# Patient Record
Sex: Male | Born: 1945 | Race: White | Hispanic: No | Smoking: Former smoker
Health system: Southern US, Community
[De-identification: ages and names within clinical notes are randomized; demographics above are authoritative.]

## PROBLEM LIST (undated history)

## (undated) DIAGNOSIS — I2584 Coronary atherosclerosis due to calcified coronary lesion: Secondary | ICD-10-CM

## (undated) DIAGNOSIS — I10 Essential (primary) hypertension: Secondary | ICD-10-CM

## (undated) DIAGNOSIS — Z720 Tobacco use: Secondary | ICD-10-CM

## (undated) DIAGNOSIS — I2510 Atherosclerotic heart disease of native coronary artery without angina pectoris: Secondary | ICD-10-CM

## (undated) DIAGNOSIS — I4891 Unspecified atrial fibrillation: Secondary | ICD-10-CM

## (undated) DIAGNOSIS — E785 Hyperlipidemia, unspecified: Secondary | ICD-10-CM

## (undated) DIAGNOSIS — E669 Obesity, unspecified: Secondary | ICD-10-CM

## (undated) DIAGNOSIS — G4733 Obstructive sleep apnea (adult) (pediatric): Secondary | ICD-10-CM

## (undated) DIAGNOSIS — G4730 Sleep apnea, unspecified: Secondary | ICD-10-CM

## (undated) DIAGNOSIS — N529 Male erectile dysfunction, unspecified: Secondary | ICD-10-CM

## (undated) HISTORY — DX: Sleep apnea, unspecified: G47.30

## (undated) HISTORY — DX: Obstructive sleep apnea (adult) (pediatric): G47.33

## (undated) HISTORY — DX: Hyperlipidemia, unspecified: E78.5

## (undated) HISTORY — PX: TONSILLECTOMY: SUR1361

## (undated) HISTORY — PX: EYE SURGERY: SHX253

## (undated) HISTORY — DX: Coronary atherosclerosis due to calcified coronary lesion: I25.84

## (undated) HISTORY — DX: Tobacco use: Z72.0

## (undated) HISTORY — DX: Atherosclerotic heart disease of native coronary artery without angina pectoris: I25.10

## (undated) HISTORY — DX: Obesity, unspecified: E66.9

## (undated) HISTORY — DX: Male erectile dysfunction, unspecified: N52.9

## (undated) HISTORY — DX: Unspecified atrial fibrillation: I48.91

## (undated) HISTORY — DX: Essential (primary) hypertension: I10

## (undated) HISTORY — PX: VASECTOMY: SHX75

---

## 2005-01-06 ENCOUNTER — Ambulatory Visit (HOSPITAL_COMMUNITY): Admission: RE | Admit: 2005-01-06 | Discharge: 2005-01-06 | Payer: Self-pay | Admitting: Gastroenterology

## 2012-01-16 ENCOUNTER — Other Ambulatory Visit: Payer: Self-pay | Admitting: Family Medicine

## 2012-02-11 ENCOUNTER — Other Ambulatory Visit: Payer: Self-pay | Admitting: *Deleted

## 2012-02-27 ENCOUNTER — Other Ambulatory Visit: Payer: Self-pay | Admitting: Physician Assistant

## 2012-03-30 ENCOUNTER — Other Ambulatory Visit: Payer: Self-pay | Admitting: Physician Assistant

## 2012-03-30 ENCOUNTER — Telehealth: Payer: Self-pay

## 2012-03-30 NOTE — Telephone Encounter (Signed)
Pt states he is needing rx refills and that he was told he needed an ov for more rx refills, he states he cant come in for ov he is unemployed, no insurance and is broke, he would like to know if Dr can refill rx for him.

## 2012-03-31 NOTE — Telephone Encounter (Signed)
Medication is already refilled.

## 2012-04-13 ENCOUNTER — Other Ambulatory Visit: Payer: Self-pay | Admitting: Physician Assistant

## 2012-04-15 ENCOUNTER — Other Ambulatory Visit: Payer: Self-pay | Admitting: Family Medicine

## 2012-04-15 MED ORDER — NIACIN ER (ANTIHYPERLIPIDEMIC) 1000 MG PO TBCR: EXTENDED_RELEASE_TABLET | ORAL | Status: DC

## 2012-05-18 ENCOUNTER — Ambulatory Visit: Payer: Self-pay | Admitting: Family Medicine

## 2012-05-18 ENCOUNTER — Encounter: Payer: Self-pay | Admitting: Family Medicine

## 2012-05-18 VITALS — BP 146/88 | HR 62 | Temp 97.6°F | Resp 16 | Ht 72.5 in | Wt 269.2 lb

## 2012-05-18 DIAGNOSIS — E785 Hyperlipidemia, unspecified: Secondary | ICD-10-CM

## 2012-05-18 DIAGNOSIS — I10 Essential (primary) hypertension: Secondary | ICD-10-CM

## 2012-05-18 DIAGNOSIS — Z125 Encounter for screening for malignant neoplasm of prostate: Secondary | ICD-10-CM

## 2012-05-18 MED ORDER — NIACIN ER (ANTIHYPERLIPIDEMIC) 1000 MG PO TBCR: EXTENDED_RELEASE_TABLET | ORAL | Status: DC

## 2012-05-18 MED ORDER — GEMFIBROZIL 600 MG PO TABS
600.0000 mg | ORAL_TABLET | Freq: Two times a day (BID) | ORAL | Status: DC
Start: 2012-05-18 — End: 2012-10-12

## 2012-05-18 MED ORDER — LOSARTAN POTASSIUM 50 MG PO TABS: 50.0000 mg | ORAL_TABLET | Freq: Every day | ORAL | Status: DC

## 2012-05-18 NOTE — Patient Instructions (Addendum)
Hypertriglyceridemia  Diet for High blood levels of Triglycerides Most fats in food are triglycerides. Triglycerides in your blood are stored as fat in your body. High levels of triglycerides in your blood may put you at a greater risk for heart disease and stroke.  Normal triglyceride levels are less than 150 mg/dL. Borderline high levels are 150-199 mg/dl. High levels are 200 - 499 mg/dL, and very high triglyceride levels are greater than 500 mg/dL. The decision to treat high triglycerides is generally based on the level. For people with borderline or high triglyceride levels, treatment includes weight loss and exercise. Drugs are recommended for people with very high triglyceride levels. Many people who need treatment for high triglyceride levels have metabolic syndrome. This syndrome is a collection of disorders that often include: insulin resistance, high blood pressure, blood clotting problems, high cholesterol and triglycerides. TESTING PROCEDURE FOR TRIGLYCERIDES  You should not eat 4 hours before getting your triglycerides measured. The normal range of triglycerides is between 10 and 250 milligrams per deciliter (mg/dl). Some people may have extreme levels (1000 or above), but your triglyceride level may be too high if it is above 150 mg/dl, depending on what other risk factors you have for heart disease.   People with high blood triglycerides may also have high blood cholesterol levels. If you have high blood cholesterol as well as high blood triglycerides, your risk for heart disease is probably greater than if you only had high triglycerides. High blood cholesterol is one of the main risk factors for heart disease.  CHANGING YOUR DIET  Your weight can affect your blood triglyceride level. If you are more than 20% above your ideal body weight, you may be able to lower your blood triglycerides by losing weight. Eating less and exercising regularly is the best way to combat this. Fat provides  more calories than any other food. The best way to lose weight is to eat less fat. Only 30% of your total calories should come from fat. Less than 7% of your diet should come from saturated fat. A diet low in fat and saturated fat is the same as a diet to decrease blood cholesterol. By eating a diet lower in fat, you may lose weight, lower your blood cholesterol, and lower your blood triglyceride level.  Eating a diet low in fat, especially saturated fat, may also help you lower your blood triglyceride level. Ask your dietitian to help you figure how much fat you can eat based on the number of calories your caregiver has prescribed for you.  Exercise, in addition to helping with weight loss may also help lower triglyceride levels.   Alcohol can increase blood triglycerides. You may need to stop drinking alcoholic beverages.   Too much carbohydrate in your diet may also increase your blood triglycerides. Some complex carbohydrates are necessary in your diet. These may include bread, rice, potatoes, other starchy vegetables and cereals.   Reduce "simple" carbohydrates. These may include pure sugars, candy, honey, and jelly without losing other nutrients. If you have the kind of high blood triglycerides that is affected by the amount of carbohydrates in your diet, you will need to eat less sugar and less high-sugar foods. Your caregiver can help you with this.   Adding 2-4 grams of fish oil (EPA+ DHA) may also help lower triglycerides. Speak with your caregiver before adding any supplements to your regimen.  Following the Diet  Maintain your ideal weight. Your caregivers can help you with a diet. Generally,   eating less food and getting more exercise will help you lose weight. Joining a weight control group may also help. Ask your caregivers for a good weight control group in your area.  Eat low-fat foods instead of high-fat foods. This can help you lose weight too.  These foods are lower in fat. Eat MORE  of these:   Dried beans, peas, and lentils.   Egg whites.   Low-fat cottage cheese.   Fish.   Lean cuts of meat, such as round, sirloin, rump, and flank (cut extra fat off meat you fix).   Whole grain breads, cereals and pasta.   Skim and nonfat dry milk.   Low-fat yogurt.   Poultry without the skin.   Cheese made with skim or part-skim milk, such as mozzarella, parmesan, farmers', ricotta, or pot cheese.  These are higher fat foods. Eat LESS of these:   Whole milk and foods made from whole milk, such as American, blue, cheddar, monterey jack, and swiss cheese   High-fat meats, such as luncheon meats, sausages, knockwurst, bratwurst, hot dogs, ribs, corned beef, ground pork, and regular ground beef.   Fried foods.  Limit saturated fats in your diet. Substituting unsaturated fat for saturated fat may decrease your blood triglyceride level. You will need to read package labels to know which products contain saturated fats.  These foods are high in saturated fat. Eat LESS of these:   Fried pork skins.   Whole milk.   Skin and fat from poultry.   Palm oil.   Butter.   Shortening.   Cream cheese.   Bacon.   Margarines and baked goods made from listed oils.   Vegetable shortenings.   Chitterlings.   Fat from meats.   Coconut oil.   Palm kernel oil.   Lard.   Cream.   Sour cream.   Fatback.   Coffee whiteners and non-dairy creamers made with these oils.   Cheese made from whole milk.  Use unsaturated fats (both polyunsaturated and monounsaturated) moderately. Remember, even though unsaturated fats are better than saturated fats; you still want a diet low in total fat.  These foods are high in unsaturated fat:   Canola oil.   Sunflower oil.   Mayonnaise.   Almonds.   Peanuts.   Pine nuts.   Margarines made with these oils.   Safflower oil.   Olive oil.   Avocados.   Cashews.   Peanut butter.   Sunflower seeds.   Soybean oil.     Peanut oil.   Olives.   Pecans.   Walnuts.   Pumpkin seeds.  Avoid sugar and other high-sugar foods. This will decrease carbohydrates without decreasing other nutrients. Sugar in your food goes rapidly to your blood. When there is excess sugar in your blood, your liver may use it to make more triglycerides. Sugar also contains calories without other important nutrients.  Eat LESS of these:   Sugar, brown sugar, powdered sugar, jam, jelly, preserves, honey, syrup, molasses, pies, candy, cakes, cookies, frosting, pastries, colas, soft drinks, punches, fruit drinks, and regular gelatin.   Avoid alcohol. Alcohol, even more than sugar, may increase blood triglycerides. In addition, alcohol is high in calories and low in nutrients. Ask for sparkling water, or a diet soft drink instead of an alcoholic beverage.  Suggestions for planning and preparing meals   Bake, broil, grill or roast meats instead of frying.   Remove fat from meats and skin from poultry before cooking.   Add spices,   herbs, lemon juice or vinegar to vegetables instead of salt, rich sauces or gravies.   Use a non-stick skillet without fat or use no-stick sprays.   Cool and refrigerate stews and broth. Then remove the hardened fat floating on the surface before serving.   Refrigerate meat drippings and skim off fat to make low-fat gravies.   Serve more fish.   Use less butter, margarine and other high-fat spreads on bread or vegetables.   Use skim or reconstituted non-fat dry milk for cooking.   Cook with low-fat cheeses.   Substitute low-fat yogurt or cottage cheese for all or part of the sour cream in recipes for sauces, dips or congealed salads.   Use half yogurt/half mayonnaise in salad recipes.   Substitute evaporated skim milk for cream. Evaporated skim milk or reconstituted non-fat dry milk can be whipped and substituted for whipped cream in certain recipes.   Choose fresh fruits for dessert instead of  high-fat foods such as pies or cakes. Fruits are naturally low in fat.  When Dining Out   Order low-fat appetizers such as fruit or vegetable juice, pasta with vegetables or tomato sauce.   Select clear, rather than cream soups.   Ask that dressings and gravies be served on the side. Then use less of them.   Order foods that are baked, broiled, poached, steamed, stir-fried, or roasted.   Ask for margarine instead of butter, and use only a small amount.   Drink sparkling water, unsweetened tea or coffee, or diet soft drinks instead of alcohol or other sweet beverages.  QUESTIONS AND ANSWERS ABOUT OTHER FATS IN THE BLOOD: SATURATED FAT, TRANS FAT, AND CHOLESTEROL What is trans fat? Trans fat is a type of fat that is formed when vegetable oil is hardened through a process called hydrogenation. This process helps makes foods more solid, gives them shape, and prolongs their shelf life. Trans fats are also called hydrogenated or partially hydrogenated oils.  What do saturated fat, trans fat, and cholesterol in foods have to do with heart disease? Saturated fat, trans fat, and cholesterol in the diet all raise the level of LDL "bad" cholesterol in the blood. The higher the LDL cholesterol, the greater the risk for coronary heart disease (CHD). Saturated fat and trans fat raise LDL similarly.  What foods contain saturated fat, trans fat, and cholesterol? High amounts of saturated fat are found in animal products, such as fatty cuts of meat, chicken skin, and full-fat dairy products like butter, whole milk, cream, and cheese, and in tropical vegetable oils such as palm, palm kernel, and coconut oil. Trans fat is found in some of the same foods as saturated fat, such as vegetable shortening, some margarines (especially hard or stick margarine), crackers, cookies, baked goods, fried foods, salad dressings, and other processed foods made with partially hydrogenated vegetable oils. Small amounts of trans fat  also occur naturally in some animal products, such as milk products, beef, and lamb. Foods high in cholesterol include liver, other organ meats, egg yolks, shrimp, and full-fat dairy products. How can I use the new food label to make heart-healthy food choices? Check the Nutrition Facts panel of the food label. Choose foods lower in saturated fat, trans fat, and cholesterol. For saturated fat and cholesterol, you can also use the Percent Daily Value (%DV): 5% DV or less is low, and 20% DV or more is high. (There is no %DV for trans fat.) Use the Nutrition Facts panel to choose foods low in   saturated fat and cholesterol, and if the trans fat is not listed, read the ingredients and limit products that list shortening or hydrogenated or partially hydrogenated vegetable oil, which tend to be high in trans fat. POINTS TO REMEMBER: YOU NEED A LITTLE TLC (THERAPEUTIC LIFESTYLE CHANGES)  Discuss your risk for heart disease with your caregivers, and take steps to reduce risk factors.   Change your diet. Choose foods that are low in saturated fat, trans fat, and cholesterol.   Add exercise to your daily routine if it is not already being done. Participate in physical activity of moderate intensity, like brisk walking, for at least 30 minutes on most, and preferably all days of the week. No time? Break the 30 minutes into three, 10-minute segments during the day.   Stop smoking. If you do smoke, contact your caregiver to discuss ways in which they can help you quit.   Do not use street drugs.   Maintain a normal weight.   Maintain a healthy blood pressure.   Keep up with your blood work for checking the fats in your blood as directed by your caregiver.  Document Released: 08/21/2004 Document Revised: 10/23/2011 Document Reviewed: 03/19/2009 [employer] Patient Information 2012 [employer]. 

## 2012-05-19 ENCOUNTER — Other Ambulatory Visit: Payer: Self-pay

## 2012-05-19 ENCOUNTER — Encounter: Payer: Self-pay | Admitting: Family Medicine

## 2012-05-19 DIAGNOSIS — E669 Obesity, unspecified: Secondary | ICD-10-CM | POA: Insufficient documentation

## 2012-05-19 NOTE — Progress Notes (Signed)
  Subjective:    [name], male    DOB: 22-Oct-1946, 66 y.o.   MRN: [id number]  HPI This 66 y.o. 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 He has no problems with current medications and denies HA, CP, palpitations, vision  changes, SOB, cough, fatigue, dizziness or lightheadedness, numbness or weakness. He needs med refills.  He is not fasting so labs cannot be done today (last labs performed > 1 year ago).   Review of Systems As per HPI.     Objective:   Physical Exam  Nursing note and vitals reviewed. Constitutional: He is oriented to person, place, and time. He appears well-developed and well-nourished. No distress.  HENT:  Head: Normocephalic and atraumatic.  Eyes: Conjunctivae and EOM are normal. No scleral icterus.  Cardiovascular: Normal rate.   Pulmonary/Chest: Effort normal. No respiratory distress.  Neurological: He is alert and oriented to person, place, and time. No cranial nerve deficit.  Skin: Skin is warm and dry.  Psychiatric: His behavior is normal. Thought content normal.          Assessment & Plan:   1. HTN (hypertension) -a little elevated today; encouraged healthier nutrition and weight reduction losartan (COZAAR) 50 MG tablet  Comprehensive metabolic panel  2. Dyslipidemia - improve nutrition and continue current medications niacin (NIASPAN) 1000 MG CR tablet gemfibrozil (LOPID) 600 MG tablet Lipid panel  3. Special screening for malignant neoplasm of prostate  PSA- will check today in anticipation of CPE to be done at later date

## 2012-05-25 ENCOUNTER — Other Ambulatory Visit (INDEPENDENT_AMBULATORY_CARE_PROVIDER_SITE_OTHER): Payer: Self-pay | Admitting: *Deleted

## 2012-05-25 ENCOUNTER — Other Ambulatory Visit: Payer: Self-pay | Admitting: Family Medicine

## 2012-05-25 DIAGNOSIS — I10 Essential (primary) hypertension: Secondary | ICD-10-CM

## 2012-05-25 DIAGNOSIS — Z125 Encounter for screening for malignant neoplasm of prostate: Secondary | ICD-10-CM

## 2012-05-25 DIAGNOSIS — E785 Hyperlipidemia, unspecified: Secondary | ICD-10-CM

## 2012-05-25 LAB — COMPREHENSIVE METABOLIC PANEL
CO2: 26 mEq/L (ref 19–32)
Creat: 0.71 mg/dL (ref 0.50–1.35)
Glucose, Bld: 101 mg/dL — ABNORMAL HIGH (ref 70–99)
Total Bilirubin: 0.9 mg/dL (ref 0.3–1.2)

## 2012-05-25 LAB — LIPID PANEL
Cholesterol: 198 mg/dL (ref 0–200)
Total CHOL/HDL Ratio: 6.2 Ratio
Triglycerides: 117 mg/dL (ref <150)
VLDL: 23 mg/dL (ref 0–40)

## 2012-05-28 NOTE — Progress Notes (Signed)
Quick Note:  Please call pt and advise that the following labs are abnormal...  LDL("bad") cholesterol is high; change nutrition to eat more fruit and vegetables and avoid junk and "processed" foods. Avoid fried foods also. Eat more fish and chicken (without the skin).  Regular exercise will help lower this number. Continue current medications.  Triglycerides are well controlled on medications.  Other labs are normal.   Copy to pt. ______

## 2012-08-02 ENCOUNTER — Telehealth: Payer: Self-pay

## 2012-08-02 DIAGNOSIS — I10 Essential (primary) hypertension: Secondary | ICD-10-CM

## 2012-08-02 NOTE — Telephone Encounter (Addendum)
PT STATES HIS PHARMACY HAVE BEEN TRYING TO GET 2 OF HIS MEDS REFILLED AND THEY HAVEN'T HEARD FROM ANYONE PLEASE CALL PT AT [phone]    [facility]

## 2012-08-03 MED ORDER — SILDENAFIL CITRATE 100 MG PO TABS: 100.0000 mg | ORAL_TABLET | Freq: Every day | ORAL | Status: DC | PRN

## 2012-08-03 MED ORDER — LOSARTAN POTASSIUM 50 MG PO TABS: 50.0000 mg | ORAL_TABLET | Freq: Every day | ORAL | Status: DC

## 2012-08-03 NOTE — Telephone Encounter (Signed)
I have refilled both medications, and printed them. He does need a CPE for further refills.

## 2012-08-03 NOTE — Telephone Encounter (Signed)
Wants his Viagra and Losartan 50 mg, he uses [facility]. I have advised him it may be best to give written Rx to him and have him send to [facility]. He said he will have [facility] contact us. ? Okay to fill

## 2012-08-03 NOTE — Telephone Encounter (Signed)
Thanks patient advised, I will leave the Rx at desk, since patient is going to have [facility] call me about these.

## 2012-08-04 ENCOUNTER — Other Ambulatory Visit: Payer: Self-pay | Admitting: *Deleted

## 2012-08-09 ENCOUNTER — Telehealth: Payer: Self-pay

## 2012-08-09 DIAGNOSIS — I10 Essential (primary) hypertension: Secondary | ICD-10-CM

## 2012-08-09 MED ORDER — LOSARTAN POTASSIUM 50 MG PO TABS: 50.0000 mg | ORAL_TABLET | Freq: Every day | ORAL | Status: DC

## 2012-08-09 NOTE — Telephone Encounter (Signed)
Please call patient to see how he is taking it.

## 2012-08-09 NOTE — Telephone Encounter (Signed)
[facility] called to clarify Rx sig for pt's cozaar. It says take 1 daily and also says take 1/2 daily. Checked w/local [facility] pharm where pt last had RF on 05/20/12 and the sig for the 50 mg was take one tab daily. Do you want me to call [facility] back and give them this sig?

## 2012-08-09 NOTE — Telephone Encounter (Signed)
LMOM to CB. 

## 2012-08-09 NOTE — Telephone Encounter (Signed)
Pt stated that he used to take 100 mg and cut them in half, but most recently has gotten the 50 mg and takes one per day which he prefers so he doesn't have to cut it. Called [facility] back and verified sig and entered in [place] for records.

## 2012-10-12 ENCOUNTER — Other Ambulatory Visit: Payer: Self-pay | Admitting: Radiology

## 2012-10-12 DIAGNOSIS — E785 Hyperlipidemia, unspecified: Secondary | ICD-10-CM

## 2012-10-12 MED ORDER — GEMFIBROZIL 600 MG PO TABS: 600.0000 mg | ORAL_TABLET | Freq: Two times a day (BID) | ORAL | Status: DC

## 2012-11-17 HISTORY — PX: COLONOSCOPY: SHX174

## 2012-12-19 ENCOUNTER — Telehealth: Payer: Self-pay | Admitting: *Deleted

## 2012-12-19 DIAGNOSIS — E785 Hyperlipidemia, unspecified: Secondary | ICD-10-CM

## 2012-12-19 NOTE — Telephone Encounter (Signed)
[email or web address] requesting refill on lopid 600mg .  Fax number is [phone]

## 2012-12-20 MED ORDER — GEMFIBROZIL 600 MG PO TABS: 600.0000 mg | ORAL_TABLET | Freq: Two times a day (BID) | ORAL | Status: DC

## 2012-12-20 NOTE — Telephone Encounter (Signed)
faxed

## 2012-12-20 NOTE — Telephone Encounter (Signed)
Rx printed to be sent to [facility]

## 2013-01-19 ENCOUNTER — Other Ambulatory Visit: Payer: Self-pay | Admitting: Radiology

## 2013-01-19 DIAGNOSIS — I10 Essential (primary) hypertension: Secondary | ICD-10-CM

## 2013-01-19 DIAGNOSIS — E785 Hyperlipidemia, unspecified: Secondary | ICD-10-CM

## 2013-01-19 NOTE — Telephone Encounter (Signed)
Patient needs office visit before we can renew his cozaar.

## 2013-01-19 NOTE — Telephone Encounter (Signed)
[facility] requesting Cozaar and Lipid please advise.

## 2013-01-20 ENCOUNTER — Telehealth: Payer: Self-pay | Admitting: Radiology

## 2013-01-20 NOTE — Telephone Encounter (Signed)
Pt last seen by me in July 2013; I will authorize medications for 3 months. (Pt needs to schedule visit before any more refills). EMR record shows local pharmacy. Print out those medications (#90 - no refills) and FAX to [facility] if you have the number. Thanks.

## 2013-01-20 NOTE — Telephone Encounter (Signed)
Spoke to patient he already has the rx 's and will make appt with Dr Audria Nine.

## 2013-01-20 NOTE — Telephone Encounter (Signed)
There is not a fax for them, the Rx's will need to be printed. They have stock bottles of 100 tablets. Have pended #100 please print and sign so he can get these. Patient advised he is due for follow up.

## 2013-01-20 NOTE — Telephone Encounter (Signed)
Per Amy pt does not need rx's. Disregard

## 2013-05-06 ENCOUNTER — Other Ambulatory Visit: Payer: Self-pay | Admitting: Radiology

## 2013-05-09 ENCOUNTER — Telehealth: Payer: Self-pay

## 2013-05-09 DIAGNOSIS — E785 Hyperlipidemia, unspecified: Secondary | ICD-10-CM

## 2013-05-09 DIAGNOSIS — I10 Essential (primary) hypertension: Secondary | ICD-10-CM

## 2013-05-09 NOTE — Telephone Encounter (Signed)
Pt told by his pharmacy ([facility]) that he could not have a refill on rx's because he needs to see a dr first. Pt states he has an appt set up with dr Audria Nine august 6 and will run out of his medications before then.   Please call pt: [phone]  bf

## 2013-05-10 MED ORDER — GEMFIBROZIL 600 MG PO TABS: 600.0000 mg | ORAL_TABLET | Freq: Two times a day (BID) | ORAL | Status: DC

## 2013-05-10 MED ORDER — NIACIN ER (ANTIHYPERLIPIDEMIC) 1000 MG PO TBCR: EXTENDED_RELEASE_TABLET | ORAL | Status: DC

## 2013-05-10 MED ORDER — LOSARTAN POTASSIUM 50 MG PO TABS: 50.0000 mg | ORAL_TABLET | Freq: Every day | ORAL | Status: DC

## 2013-05-10 NOTE — Telephone Encounter (Signed)
faxed

## 2013-05-10 NOTE — Telephone Encounter (Signed)
Pended please advise. These will print so we can fax to [facility]

## 2013-05-10 NOTE — Telephone Encounter (Signed)
Printed rx  

## 2013-05-13 ENCOUNTER — Ambulatory Visit: Payer: Self-pay | Admitting: Family Medicine

## 2013-05-13 VITALS — BP 124/72 | HR 61 | Temp 98.0°F | Resp 18 | Ht 73.5 in | Wt 263.0 lb

## 2013-05-13 DIAGNOSIS — L723 Sebaceous cyst: Secondary | ICD-10-CM

## 2013-05-13 MED ORDER — DOXYCYCLINE HYCLATE 100 MG PO TABS: 100.0000 mg | ORAL_TABLET | Freq: Two times a day (BID) | ORAL | Status: DC

## 2013-05-13 NOTE — Progress Notes (Signed)
[name] MRN: [id number], DOB: 03-11-1946, 67 y.o. Date of Encounter: 05/13/2013, 4:54 PM    PROCEDURE NOTE: Verbal consent obtained. Betadine prep per usual protocol. Local anesthesia obtained with 2% lidocaine with epinephrine.   1 cm incision made with 11 blade along lesion.  Culture taken. Moderate amount of purulence expressed. Copious sebaceous material expressed.  Lesion explored revealing no loculations. Packed with 1/4 plain packing, about 2 inches.   Dressed. Wound care instructions including precautions with patient. Patient tolerated the procedure well. Recheck in 48 hours.       Grier Mitts, PA-C 05/13/2013 4:54 PM

## 2013-05-13 NOTE — Progress Notes (Signed)
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 The swelling is been getting worse over the last week.  Objective: 2 cm raised erythematous subcutaneous mass which is tender, over the right scapula.  Please see PA procedure note  Assessment: Infected sebaceous cyst

## 2013-05-15 ENCOUNTER — Ambulatory Visit (INDEPENDENT_AMBULATORY_CARE_PROVIDER_SITE_OTHER): Payer: Self-pay | Admitting: Physician Assistant

## 2013-05-15 VITALS — BP 119/81 | HR 59 | Temp 98.0°F | Resp 16 | Ht 73.5 in | Wt 261.0 lb

## 2013-05-15 DIAGNOSIS — L089 Local infection of the skin and subcutaneous tissue, unspecified: Secondary | ICD-10-CM

## 2013-05-15 DIAGNOSIS — L723 Sebaceous cyst: Secondary | ICD-10-CM

## 2013-05-15 NOTE — Progress Notes (Signed)
   [address], [place] [address]   Phone [phone]  Subjective:    [name], male    DOB: 01-Aug-1946, 67 y.o.   MRN: [id number]  HPI Pt presents to clinic for wound recheck.  He is s/p I&D on 6/27 of an infected sebaceous cyst.  No wound culture was done.  He is tolerating the abx ok.   e has not changed the drsg per his instructions.  The area feels much better.   Review of Systems  Constitutional: Negative for fever and chills.  Gastrointestinal: Negative for nausea.  Skin: Positive for wound.       Objective:   Physical Exam  Vitals reviewed. Constitutional: He is oriented to person, place, and time. He appears well-developed and well-nourished.  HENT:  Head: Normocephalic and atraumatic.  Right Ear: External ear normal.  Left Ear: External ear normal.  Pulmonary/Chest: Effort normal.  Neurological: He is alert and oriented to person, place, and time.  Skin: Skin is warm and dry.  Drsg and packing removed.  Minimal erythema around incision.  Minimal purulence expressed.  Irrigated with 2% lido and repacked with 1.5cm packing.  Drsg placed.  Psychiatric: He has a normal mood and affect. His behavior is normal. Judgment and thought content normal.       Assessment & Plan:  Infected Sebaceous Cyst - improved - continue abx recheck on Thursday.  Pt is going out of town and on Tuesday evening or Wednesday am he will pull out 1 cm of packing to reduce the pressure in the wound cavity.  Change the drsg daily.  Benny Lennert PA-C 05/15/2013 8:35 AM

## 2013-05-19 ENCOUNTER — Ambulatory Visit (INDEPENDENT_AMBULATORY_CARE_PROVIDER_SITE_OTHER): Payer: Self-pay | Admitting: Physician Assistant

## 2013-05-19 VITALS — BP 157/82 | HR 61 | Temp 97.5°F | Resp 18 | Ht 73.0 in | Wt 261.8 lb

## 2013-05-19 DIAGNOSIS — L723 Sebaceous cyst: Secondary | ICD-10-CM

## 2013-05-19 NOTE — Progress Notes (Signed)
  Subjective:    [name], male    DOB: January 20, 1946, 67 y.o.   MRN: [id number]  HPI   Edward Watson is a very pleasant 67 yr old male here for wound care.  S/p I&D on 05/13/13.  Pt states he is doing very well.  No pain.  The area continues to drain.  He changes the dressing once daily.  He did pull the packing out slightly as directed at last visit.  Tolerating the abx well.  No fever, chills, nausea, vomiting.     Review of Systems  Constitutional: Negative for fever and chills.  HENT: Negative.   Respiratory: Negative.   Cardiovascular: Negative.   Gastrointestinal: Negative.   Musculoskeletal: Negative.   Skin: Positive for wound.  Neurological: Negative.        Objective:   Physical Exam  Vitals reviewed. Constitutional: He is oriented to person, place, and time. He appears well-developed and well-nourished. No distress.  HENT:  Head: Normocephalic and atraumatic.  Eyes: Conjunctivae are normal. No scleral icterus.  Pulmonary/Chest: Effort normal.  Neurological: He is alert and oriented to person, place, and time.  Skin: Skin is warm and dry.     Healing wound at right upper back; no erythema or induration  Psychiatric: He has a normal mood and affect. His behavior is normal.    Wound Care: Dressing and packing removed.  Packing saturated with purulent material.  Able to express a very small amount of purulence from the wound.  Irrigated with 3cc 2% plain lidocaine.  Repacked with 1/4" plain packing.  Dressing applied.         Assessment & Plan:  Infected sebaceous cyst   Edward Watson is a very pleasant 67 yr old male here for follow up on an infected sebaceous cyst that was drained here 05/13/13.  Pt is doing very well.  The wound is healing very nicely.  Small amount of drainage still.  I have repacked the wound.  Continue daily dressing changes.  Continue abx.  Recheck in 2-3 days.  Fast track card updated for 7/5 or 7/6, pt preference.  RTC sooner if  concerns.

## 2013-05-22 ENCOUNTER — Ambulatory Visit (INDEPENDENT_AMBULATORY_CARE_PROVIDER_SITE_OTHER): Payer: Self-pay | Admitting: Physician Assistant

## 2013-05-22 VITALS — BP 130/76 | HR 65 | Temp 97.7°F | Resp 18 | Ht 73.0 in | Wt 263.0 lb

## 2013-05-22 DIAGNOSIS — L723 Sebaceous cyst: Secondary | ICD-10-CM

## 2013-05-22 DIAGNOSIS — L089 Local infection of the skin and subcutaneous tissue, unspecified: Secondary | ICD-10-CM

## 2013-05-22 NOTE — Progress Notes (Signed)
   [name] MRN: [id number], DOB: 11/26/1945 67 y.o. Date of Encounter: 05/22/2013, 3:07 PM  Primary Physician: Dow Adolph, MD  Chief Complaint: Wound care   See previous note  HPI: 67 y.o. male presents for wound care s/p I&D on 05/13/13 Doing well No issues or complaints Afebrile/ no chills No nausea or vomiting Tolerating Doxycycline Pain resolved Daily dressing change Previous note reviewed  Past Medical History  Diagnosis Date  . Hypertension   . Hyperlipidemia      Home Meds: Prior to Admission medications   Medication Sig Start Date End Date Taking? Authorizing Provider  doxycycline (VIBRA-TABS) 100 MG tablet Take 1 tablet (100 mg total) by mouth 2 (two) times daily. 05/13/13  Yes Elvina Sidle, MD  gemfibrozil (LOPID) 600 MG tablet Take 1 tablet (600 mg total) by mouth 2 (two) times daily. 05/10/13  Yes Eleanore E Egan, PA-C  losartan (COZAAR) 50 MG tablet Take 1 tablet (50 mg total) by mouth daily. 05/10/13  Yes Eleanore Delia Chimes, PA-C  niacin (NIASPAN) 1000 MG CR tablet Take 1 pill once daily as directed with a low fat snack and aspirin 30 minutes prior to taking medication 05/10/13  Yes Eleanore E Egan, PA-C  sildenafil (VIAGRA) 100 MG tablet Take 1 tablet (100 mg total) by mouth daily as needed. 08/03/12  Yes Dustine Bertini M Madhavi Hamblen, PA-C    Allergies: No Known Allergies  ROS: Constitutional: Afebrile, no chills Cardiovascular: negative for chest pain or palpitations Dermatological: Positive for wound. Negative for erythema, pain, or warmth  GI: No nausea or vomiting   EXAM: Physical Exam: Blood pressure 130/76, pulse 65, temperature 97.7 F (36.5 C), temperature source Oral, resp. rate 18, height 6\' 1"  (1.854 m), weight 263 lb (119.296 kg), SpO2 95.00%., Body mass index is 34.71 kg/(m^2). General: Well developed, well nourished, in no acute distress. Nontoxic appearing. Head: Normocephalic, atraumatic, sclera non-icteric.  Neck: Supple. Lungs:  Breathing is unlabored. Heart: Normal rate. Skin:  Warm and moist. Dressing and packing in place. No induration, erythema, or tenderness to palpation. Neuro: Alert and oriented X 3. Moves all extremities spontaneously. Normal gait.  Psych:  Responds to questions appropriately with a normal affect.   PROCEDURE: Dressing and packing removed. Still able to express a very small amount of purulence from the wound Wound bed healthy Irrigated with 1% plain lidocaine 5 cc. Repacked with small amount of 1/4 inch plain packing Dressing applied  LAB: Culture:   A/P: 67 y.o. male with cellulitis/abscess as above s/p I&D on 05/13/13 -Wound care per above -Continue Doxycyline -Pain well controlled -Daily dressing changes -Recheck 48 hours -Possibly last visit  SignedEula Listen, PA-C 05/22/2013 3:07 PM

## 2013-05-24 ENCOUNTER — Ambulatory Visit (INDEPENDENT_AMBULATORY_CARE_PROVIDER_SITE_OTHER): Payer: Self-pay | Admitting: Physician Assistant

## 2013-05-24 VITALS — BP 124/72 | HR 66 | Temp 97.8°F | Resp 18 | Ht 73.0 in | Wt 264.0 lb

## 2013-05-24 DIAGNOSIS — L723 Sebaceous cyst: Secondary | ICD-10-CM

## 2013-05-24 NOTE — Progress Notes (Signed)
  Subjective:    [name], male    DOB: 07-Aug-1946, 67 y.o.   MRN: [id number]  HPI   Edward Watson is a very pleasant 67 yr old male here for follow up on an infected sebaceous cyst.  Previous notes reviewed.  Pt states he continues to do very well.  Has no pain whatsoever.  Continues to tolerate the abx very well - three doses left.  Has taken care to cover up when outside on his motorcycle due to sun sensitivity with doxy.  Has been changing the dressing as directed.    Review of Systems  Skin: Positive for wound.  All other systems reviewed and are negative.       Objective:   Physical Exam  Vitals reviewed. Constitutional: He is oriented to person, place, and time. He appears well-developed and well-nourished. No distress.  HENT:  Head: Normocephalic and atraumatic.  Eyes: Conjunctivae are normal. No scleral icterus.  Pulmonary/Chest: Effort normal.  Neurological: He is alert and oriented to person, place, and time.  Skin: Skin is warm and dry.     Healing wound at right upper back; no erythema or induration; wound bed healthy; able to express a very small amount of purulence   Psychiatric: He has a normal mood and affect. His behavior is normal.        Assessment & Plan:  Infected sebaceous cyst   Edward Watson is a very pleasant 67 yr old male here for follow up on an infected sebaceous cyst.  The wound appears to be healing very well.  I was able to express a small amount of purulence, which I think was unable to drain due to packing.  I do not think the area requires any further packing material.  Continue daily and prn dressing changes until wound is completely closed.  Finish abx.  No need for further follow up unless new concerns arise.

## 2013-06-22 ENCOUNTER — Telehealth: Payer: Self-pay | Admitting: Family Medicine

## 2013-06-22 ENCOUNTER — Encounter: Payer: Self-pay | Admitting: Family Medicine

## 2013-06-22 ENCOUNTER — Ambulatory Visit (INDEPENDENT_AMBULATORY_CARE_PROVIDER_SITE_OTHER): Payer: Medicare Other | Admitting: Family Medicine

## 2013-06-22 VITALS — BP 142/77 | HR 64 | Temp 97.0°F | Resp 16 | Ht 73.5 in | Wt 262.0 lb

## 2013-06-22 DIAGNOSIS — E785 Hyperlipidemia, unspecified: Secondary | ICD-10-CM

## 2013-06-22 DIAGNOSIS — I10 Essential (primary) hypertension: Secondary | ICD-10-CM

## 2013-06-22 DIAGNOSIS — Z130 Encounter for screening for diseases of the blood and blood-forming organs and certain disorders involving the immune mechanism: Secondary | ICD-10-CM

## 2013-06-22 DIAGNOSIS — Z7189 Other specified counseling: Secondary | ICD-10-CM

## 2013-06-22 DIAGNOSIS — Z125 Encounter for screening for malignant neoplasm of prostate: Secondary | ICD-10-CM

## 2013-06-22 DIAGNOSIS — Z720 Tobacco use: Secondary | ICD-10-CM | POA: Insufficient documentation

## 2013-06-22 DIAGNOSIS — Z716 Tobacco abuse counseling: Secondary | ICD-10-CM

## 2013-06-22 HISTORY — DX: Tobacco use: Z72.0

## 2013-06-22 LAB — CBC WITH DIFFERENTIAL/PLATELET
Basophils Absolute: 0 10*3/uL (ref 0.0–0.1)
Basophils Relative: 0 % (ref 0–1)
Eosinophils Relative: 4 % (ref 0–5)
HCT: 42 % (ref 39.0–52.0)
Lymphocytes Relative: 34 % (ref 12–46)
MCHC: 34.5 g/dL (ref 30.0–36.0)
MCV: 88.6 fL (ref 78.0–100.0)
Monocytes Absolute: 0.6 10*3/uL (ref 0.1–1.0)
RDW: 13.3 % (ref 11.5–15.5)

## 2013-06-22 LAB — LIPID PANEL
HDL: 35 mg/dL — ABNORMAL LOW (ref >39)
LDL Cholesterol: 119 mg/dL — ABNORMAL HIGH (ref 0–99)
Triglycerides: 127 mg/dL (ref <150)
VLDL: 25 mg/dL (ref 0–40)

## 2013-06-22 LAB — COMPREHENSIVE METABOLIC PANEL
ALT: 18 U/L (ref 0–53)
AST: 20 U/L (ref 0–37)
Creat: 0.78 mg/dL (ref 0.50–1.35)
Total Bilirubin: 0.6 mg/dL (ref 0.3–1.2)

## 2013-06-22 MED ORDER — GEMFIBROZIL 600 MG PO TABS: 600.0000 mg | ORAL_TABLET | Freq: Two times a day (BID) | ORAL | Status: DC

## 2013-06-22 MED ORDER — VARENICLINE TARTRATE 0.5 MG PO TABS: ORAL_TABLET | ORAL | Status: DC

## 2013-06-22 MED ORDER — LOSARTAN POTASSIUM 50 MG PO TABS: 50.0000 mg | ORAL_TABLET | Freq: Every day | ORAL | Status: DC

## 2013-06-22 MED ORDER — NIACIN ER (ANTIHYPERLIPIDEMIC) 1000 MG PO TBCR: EXTENDED_RELEASE_TABLET | ORAL | Status: DC

## 2013-06-22 MED ORDER — VARENICLINE TARTRATE 1 MG PO TABS: 1.0000 mg | ORAL_TABLET | Freq: Two times a day (BID) | ORAL | Status: DC

## 2013-06-22 NOTE — Telephone Encounter (Signed)
Pt called, said pharmacy was going to charge over $700 for his prescriptions. He would like [place] to fax three of them to [facility] instead. (Losartan, Genfibrozil, and Niaspan)   [facility] Fax: [phone]. Pt would like a call back regarding this: [phone].

## 2013-06-22 NOTE — Patient Instructions (Addendum)

## 2013-06-22 NOTE — Progress Notes (Signed)
S:  This 67 y.o. Cauc male has HTN and dyslipidemia (Low HDL and high LDL with CAD risk >6.0 based on TChol/HDL ratio). Pt is compliant with medications and lifestyle changes. Pt denies fatigue, diaphoresis, appetite loss, vision changes, CP or tightness, palpitations, edema, SOB or DOE, cough or wheezing, GI problems, myalgias, HA, dizziness, numbness, weakness, tremor or syncope. He continues to travel on his motorcycle.  Pt requests Chantix; he has quit smoking before but resumes habit when stressed. He has no chronic SOB, dyspnea, prod cough or hemoptysis.   Patient Active Problem List   Diagnosis Date Noted  . Tobacco user 06/22/2013  . Obesity, Class II, BMI 35-39.9, with comorbidity 05/19/2012  . HTN (hypertension) 05/18/2012  . Dyslipidemia 05/18/2012   PMHx, Soc Hx and Fam Hx reviewed. Medications reconciled.  ROS: As per HPI; otherwise noncontributory.  O: Filed Vitals:   06/22/13 0914  BP: 142/77  Pulse: 64  Temp: 97 F (36.1 C)  Resp: 16   GEN: In NAD; WN,WD. HENT: [place]/AT; EOMI w/ clear conj/sclerae. EACs/nose/oroph unremarkable. COR: RRR. No m/g/r. LUNGS: CTA; normal resp rate and effort. SKIN: W&D; no rashes, erythema or pallor. MS: MAEs; no c/c/e. No joint deformities. NEURO: A&Ox 3; Cns intact. Nonfocal.  A/P: Dyslipidemia - Plan: Lipid panel, gemfibrozil (LOPID) 600 MG tablet, niacin (NIASPAN) 1000 MG CR tablet  HTN (hypertension) - Stable and controlled on current medications.        Plan: Comprehensive metabolic panel, TSH, losartan (COZAAR) 50 MG tablet, DISCONTINUED: losartan (COZAAR) 50 MG tablet  Encounter for smoking cessation counseling- Advised about Chantix use and picking quit date w/in 2 weeks of starting medication.  Screening for deficiency anemia - Plan: CBC with Differential  Special screening for malignant neoplasm of prostate - Plan: PSA  RTC for CPE in 6 weeks.  Meds ordered this encounter  Medications  . DISCONTD: losartan (COZAAR) 50  MG tablet    Sig: Take 1 tablet (50 mg total) by mouth daily.    Dispense:  100 tablet    Refill:  2  . gemfibrozil (LOPID) 600 MG tablet    Sig: Take 1 tablet (600 mg total) by mouth 2 (two) times daily.    Dispense:  180 tablet    Refill:  3    Order Specific Question:  Supervising Provider    Answer:  DOOLITTLE, ROBERT P [[id number]]  . niacin (NIASPAN) 1000 MG CR tablet    Sig: Take 1 pill once daily as directed with a low fat snack and aspirin 30 minutes prior to taking medication    Dispense:  100 tablet    Refill:  2  . losartan (COZAAR) 50 MG tablet    Sig: Take 1 tablet (50 mg total) by mouth daily.    Dispense:  30 tablet    Refill:  0  . varenicline (CHANTIX CONTINUING MONTH PAK) 1 MG tablet    Sig: Take 1 tablet (1 mg total) by mouth 2 (two) times daily.    Dispense:  60 tablet    Refill:  2  . varenicline (CHANTIX) 0.5 MG tablet    Sig: Take 1 tablet once a day for 3 days then 1 tablet twice a day for 4 days.    Dispense:  12 tablet    Refill:  0

## 2013-06-24 ENCOUNTER — Telehealth: Payer: Self-pay

## 2013-06-24 ENCOUNTER — Other Ambulatory Visit: Payer: Self-pay | Admitting: Family Medicine

## 2013-06-24 DIAGNOSIS — E785 Hyperlipidemia, unspecified: Secondary | ICD-10-CM

## 2013-06-24 DIAGNOSIS — I10 Essential (primary) hypertension: Secondary | ICD-10-CM

## 2013-06-24 MED ORDER — GEMFIBROZIL 600 MG PO TABS: 600.0000 mg | ORAL_TABLET | Freq: Two times a day (BID) | ORAL | Status: DC

## 2013-06-24 MED ORDER — LOSARTAN POTASSIUM 50 MG PO TABS: 50.0000 mg | ORAL_TABLET | Freq: Every day | ORAL | Status: DC

## 2013-06-24 MED ORDER — NIACIN ER (ANTIHYPERLIPIDEMIC) 1000 MG PO TBCR: EXTENDED_RELEASE_TABLET | ORAL | Status: DC

## 2013-06-24 NOTE — Telephone Encounter (Signed)
I reprinted the RXs for the 3 meds and will have staff at [facility] fax them to [facility].

## 2013-06-24 NOTE — Telephone Encounter (Signed)
Patient notified that all RX's faxed to [facility].

## 2013-06-27 ENCOUNTER — Other Ambulatory Visit: Payer: Self-pay | Admitting: Radiology

## 2013-06-27 ENCOUNTER — Telehealth: Payer: Self-pay

## 2013-06-27 DIAGNOSIS — I10 Essential (primary) hypertension: Secondary | ICD-10-CM

## 2013-06-27 MED ORDER — LOSARTAN POTASSIUM 50 MG PO TABS: 50.0000 mg | ORAL_TABLET | Freq: Every day | ORAL | Status: DC

## 2013-06-27 NOTE — Telephone Encounter (Signed)
Was sent for 90 day supply/ losartan.

## 2013-06-27 NOTE — Progress Notes (Signed)
Quick Note:  Please advise pt that the following labs are abnormal...  Lipid panel shows slight improvement compared to 1 year ago; the HDL ("good") cholesterol is still to low but LDL ("bad") cholesterol has come down. Overall heart disease risk is a little lower.  All other labs are normal. PSA (prostate test) is normal. Complete blood counts and thyroid tests are normal. Chemistry panel with kidney and liver tests is normal.  Copy to pt. ______

## 2013-06-27 NOTE — Telephone Encounter (Signed)
PT STATES WE SENT HIS MEDICINE TO [facility] AND IT WAS A 90 DAY SUPPLY ON 2 OF THEM, BUT A 30 DAY SUPPLY ON 1, NEED [place] TO CALL A 90 DAY SUPPLY ON THAT AS WELL ALSO NEED [place] TO CALL IN HIS CHANTIX. PLEASE CALL PT AT [phone] AND THE PHONE NUMBER TO [facility] IS [phone]

## 2013-06-30 ENCOUNTER — Telehealth: Payer: Self-pay

## 2013-06-30 MED ORDER — VARENICLINE TARTRATE 1 MG PO TABS: 1.0000 mg | ORAL_TABLET | Freq: Two times a day (BID) | ORAL | Status: DC

## 2013-06-30 NOTE — Telephone Encounter (Signed)
Resent to [facility]

## 2013-06-30 NOTE — Telephone Encounter (Signed)
Patient is requesting his varenicline (CHANTIX CONTINUING MONTH PAK) 1 MG tablet  Be faxed to [facility]  [phone]

## 2013-08-11 ENCOUNTER — Encounter: Payer: Self-pay | Admitting: Family Medicine

## 2013-08-11 ENCOUNTER — Ambulatory Visit: Payer: Medicare Other

## 2013-08-11 ENCOUNTER — Telehealth: Payer: Self-pay | Admitting: Family Medicine

## 2013-08-11 ENCOUNTER — Ambulatory Visit (INDEPENDENT_AMBULATORY_CARE_PROVIDER_SITE_OTHER): Payer: Medicare Other | Admitting: Family Medicine

## 2013-08-11 VITALS — BP 128/78 | HR 62 | Temp 97.8°F | Resp 16 | Ht 73.5 in | Wt 260.0 lb

## 2013-08-11 DIAGNOSIS — Z0000 Encounter for general adult medical examination without abnormal findings: Secondary | ICD-10-CM

## 2013-08-11 DIAGNOSIS — Z139 Encounter for screening, unspecified: Secondary | ICD-10-CM

## 2013-08-11 DIAGNOSIS — Z1159 Encounter for screening for other viral diseases: Secondary | ICD-10-CM

## 2013-08-11 DIAGNOSIS — Z87891 Personal history of nicotine dependence: Secondary | ICD-10-CM

## 2013-08-11 DIAGNOSIS — Z1211 Encounter for screening for malignant neoplasm of colon: Secondary | ICD-10-CM

## 2013-08-11 DIAGNOSIS — I10 Essential (primary) hypertension: Secondary | ICD-10-CM

## 2013-08-11 DIAGNOSIS — Z23 Encounter for immunization: Secondary | ICD-10-CM

## 2013-08-11 LAB — POCT URINALYSIS DIPSTICK
Bilirubin, UA: NEGATIVE
Glucose, UA: NEGATIVE
Nitrite, UA: NEGATIVE
Protein, UA: NEGATIVE
Spec Grav, UA: 1.02
Urobilinogen, UA: 0.2

## 2013-08-11 LAB — IFOBT (OCCULT BLOOD): IFOBT: NEGATIVE

## 2013-08-11 MED ORDER — LOSARTAN POTASSIUM 50 MG PO TABS: 50.0000 mg | ORAL_TABLET | Freq: Every day | ORAL | Status: DC

## 2013-08-11 NOTE — Telephone Encounter (Signed)
Faxed Rx Losartan-[facility]

## 2013-08-11 NOTE — Patient Instructions (Signed)
Keeping you healthy  Get these tests  Blood pressure- Have your blood pressure checked once a year by your healthcare provider.  Normal blood pressure is 120/80  Weight- Have your body mass index (BMI) calculated to screen for obesity.  BMI is a measure of body fat based on height and weight. You can also calculate your own BMI at [email or web address].  Cholesterol- Have your cholesterol checked every year.  Diabetes- Have your blood sugar checked regularly if you have high blood pressure, high cholesterol, have a family history of diabetes or if you are overweight.  Screening for Colon Cancer- Colonoscopy starting at age 69.  Screening may begin sooner depending on your family history and other health conditions. Follow up colonoscopy as directed by your Gastroenterologist.  Screening for Prostate Cancer- Both blood work (PSA) and a rectal exam help screen for Prostate Cancer.  Screening begins at age 65 with African-American men and at age 3 with Caucasian men.  Screening may begin sooner depending on your family history.  Take these medicines  Aspirin- One aspirin daily can help prevent Heart disease and Stroke.  Flu shot- Every fall.  Tetanus- Every 10 years.  Zostavax- Once after the age of 53 to prevent Shingles. You have a prescription for this vaccine.  Pneumonia shot- Once after the age of 51; if you are younger than 56, ask your healthcare provider if you need a Pneumonia shot. You received this vaccine today.  Take these steps  Don't smoke- If you do smoke, talk to your doctor about quitting.  For tips on how to quit, go to www.smokefree.gov or call 1-800-QUIT-NOW.  Be physically active- Exercise 5 days a week for at least 30 minutes.  If you are not already physically active start slow and gradually work up to 30 minutes of moderate physical activity.  Examples of moderate activity include walking briskly, mowing the yard, dancing, swimming, bicycling, etc.  Eat a  healthy diet- Eat a variety of healthy food such as fruits, vegetables, low fat milk, low fat cheese, yogurt, lean meant, poultry, fish, beans, tofu, etc. For more information go to www.thenutritionsource.org  Drink alcohol in moderation- Limit alcohol intake to less than two drinks a day. Never drink and drive.  Dentist- Brush and floss twice daily; visit your dentist twice a year.  Depression- Your emotional health is as important as your physical health. If you're feeling down, or losing interest in things you would normally enjoy please talk to your healthcare provider.  Eye exam- Visit your eye doctor every year.  Safe sex- If you may be exposed to a sexually transmitted infection, use a condom.  Seat belts- Seat belts can save your life; always wear one.  Smoke/Carbon Monoxide detectors- These detectors need to be installed on the appropriate level of your home.  Replace batteries at least once a year.  Skin cancer- When out in the sun, cover up and use sunscreen 15 SPF or higher.  Violence- If anyone is threatening you, please tell your healthcare provider.  Living Will/ Health care power of attorney- Speak with your healthcare provider and family.

## 2013-08-11 NOTE — Progress Notes (Signed)
Subjective:    [name], male    DOB: Jun 24, 1946, 67 y.o.   MRN: [id number]  HPI  This 67 y.o. Cauc male is here for [facility] visit and exam. He has well controlled HTN and a lipid disorder, treated w/ gemfibrozil and niacin, both well tolerated. Pt has tried drinking wine but does not like the taste; red wine to dry but he likes sweet muscadine wine. Pt has stopped smoking for 3 weeks and has almost finished Chantix.   Pt continues to enjoy traveling on his motorcycle; he is walking and doing some strength training.  HCM: CRS- 10 years ago.           IMM- declines Flu vaccine; needs Pneumovax and Zostavax.           Vision- not recently.   Review of Systems  Constitutional: Negative.   HENT: Negative.   Eyes: Negative.   Respiratory: Negative.   Cardiovascular: Negative.   Gastrointestinal: Negative.   Endocrine: Negative.   Genitourinary: Negative.   Musculoskeletal: Negative.   Skin: Negative.   Allergic/Immunologic: Negative.   Neurological: Negative.   Hematological: Negative.   Psychiatric/Behavioral: Negative.        Objective:   Physical Exam  Nursing note and vitals reviewed. Constitutional: He is oriented to person, place, and time. Vital signs are normal. He appears well-developed and well-nourished. No distress.  HENT:  Head: Normocephalic and atraumatic.  Right Ear: Hearing, tympanic membrane, external ear and ear canal normal.  Left Ear: Hearing, tympanic membrane, external ear and ear canal normal.  Nose: Nose normal. No nasal deformity or septal deviation.  Mouth/Throat: Uvula is midline, oropharynx is clear and moist and mucous membranes are normal. No oral lesions. Normal dentition. No dental caries.  Eyes: Conjunctivae, EOM and lids are normal. Pupils are equal, round, and reactive to light. No scleral icterus.  Fundoscopic exam:      The right eye shows arteriolar narrowing. The right eye shows no papilledema. The right eye  shows red reflex.       The left eye shows arteriolar narrowing. The left eye shows no papilledema. The left eye shows red reflex.  Neck: Normal range of motion and full passive range of motion without pain. Neck supple. No JVD present. No spinous process tenderness and no muscular tenderness present. Carotid bruit is not present. Normal range of motion present. No mass and no thyromegaly present.  Cardiovascular: Normal rate, regular rhythm, S1 normal and S2 normal.   No extrasystoles are present. PMI is not displaced.  Exam reveals distant heart sounds. Exam reveals no gallop and no friction rub.   No murmur heard. Pulses:      Carotid pulses are 1+ on the right side, and 1+ on the left side.      Radial pulses are 1+ on the right side, and 1+ on the left side.       Femoral pulses are 1+ on the right side, and 1+ on the left side.      Popliteal pulses are 1+ on the right side, and 1+ on the left side.       Dorsalis pedis pulses are 1+ on the right side, and 1+ on the left side.       Posterior tibial pulses are 1+ on the right side, and 1+ on the left side.  Pulmonary/Chest: Effort normal and breath sounds normal. No respiratory distress. He has no wheezes.  Barrel chest.  Abdominal: Soft.  Normal appearance, normal aorta and bowel sounds are normal. He exhibits no distension, no abdominal bruit, no pulsatile midline mass and no mass. There is no hepatosplenomegaly. There is no tenderness. There is no guarding and no CVA tenderness. No hernia.  Genitourinary: Rectum normal. Rectal exam shows no external hemorrhoid, no fissure, no mass, no tenderness and anal tone normal. Guaiac negative stool. Prostate is not tender.  R lobe of prostate slightly larger than L; no discrete nodules .  Musculoskeletal: Normal range of motion. He exhibits no edema and no tenderness.  Mild degenerative joint deformities of elbows, wrists, digits, knees and ankles.  Lymphadenopathy:       Head (right side): No  submental, no submandibular, no tonsillar, no posterior auricular and no occipital adenopathy present.       Head (left side): No submental, no submandibular, no tonsillar, no posterior auricular and no occipital adenopathy present.    He has no cervical adenopathy.       Right: No inguinal and no supraclavicular adenopathy present.       Left: No inguinal and no supraclavicular adenopathy present.  Neurological: He is alert and oriented to person, place, and time. He has normal strength and normal reflexes. He is not disoriented. He displays no atrophy. No cranial nerve deficit or sensory deficit. He exhibits normal muscle tone. He displays a negative Romberg sign. Coordination and gait normal.  Skin: Skin is warm, dry and intact. No ecchymosis, no lesion and no rash noted. He is not diaphoretic. No cyanosis or erythema. No pallor.  Psychiatric: He has a normal mood and affect. His speech is normal and behavior is normal. Judgment and thought content normal. Cognition and memory are normal.     Results for orders placed in visit on 08/11/13  POCT URINALYSIS DIPSTICK      Result Value Range   Color, UA yellow     Clarity, UA clear     Glucose, UA neg     Bilirubin, UA neg     Ketones, UA neg     Spec Grav, UA 1.020     Blood, UA trace-intact     pH, UA 6.0     Protein, UA neg     Urobilinogen, UA 0.2     Nitrite, UA neg     Leukocytes, UA Negative    IFOBT (OCCULT BLOOD)      Result Value Range   IFOBT Negative      [facility] reading (PRIMARY) by  Dr. Audria Nine: CXR- Normal cardiac silhouette. Increased perihilar markings w/ questionable lesion in L mid-lung near hilum.   ECG: Sinus rhythm w/ borderline 1st degree A-V block. No other ST-TW changes     Assessment & Plan:  Routine general medical examination at a health care facility - Plan: POCT urinalysis dipstick, IFOBT POC (occult bld, rslt in office), EKG 12-Lead  HTN (hypertension) - Plan: losartan (COZAAR) 50 MG  tablet  History of tobacco abuse - Discussed my interpretation w/ pt; will contact him w/ radiology findings. Advised him that further imaging may be needed.           Plan: DG Chest 2 View  Need for hepatitis C screening test - Plan: Hepatitis C antibody  Need for prophylactic vaccination against Streptococcus pneumoniae (pneumococcus) - Plan: Pneumococcal polysaccharide vaccine 23-valent greater than or equal to 2yo subcutaneous/IM  Screening for colorectal cancer - Plan: Ambulatory referral to Gastroenterology  Pt given prescription for Zostavax vaccine; he will get this next month  when he returns from his next road trip.

## 2013-08-12 ENCOUNTER — Telehealth: Payer: Self-pay | Admitting: Family Medicine

## 2013-08-12 NOTE — Telephone Encounter (Signed)
Chest xray has questionable area in left lung field that may need further evaluation. I discussed this w/ the pt at his visit and mentioned that he may need CT of chest to exclude any lung mass. I called and left a message for pt; I will attempt to contact him on Saturday or Sunday.

## 2013-08-12 NOTE — Progress Notes (Unsigned)
This encounter was created in error - please disregard.

## 2013-08-15 ENCOUNTER — Telehealth: Payer: Self-pay | Admitting: Family Medicine

## 2013-08-18 ENCOUNTER — Telehealth: Payer: Self-pay

## 2013-08-18 ENCOUNTER — Other Ambulatory Visit: Payer: Self-pay | Admitting: Family Medicine

## 2013-08-18 ENCOUNTER — Telehealth: Payer: Self-pay | Admitting: Family Medicine

## 2013-08-18 DIAGNOSIS — F17210 Nicotine dependence, cigarettes, uncomplicated: Secondary | ICD-10-CM

## 2013-08-18 DIAGNOSIS — J984 Other disorders of lung: Secondary | ICD-10-CM

## 2013-08-18 NOTE — Telephone Encounter (Signed)
Patient returned call, please try pt again @ [phone]

## 2013-08-18 NOTE — Progress Notes (Signed)
Quick Note:  Please advise pt regarding following labs...  Hepatitis C antibody is negative.   Copy to pt. ______

## 2013-08-18 NOTE — Telephone Encounter (Signed)
Pt has possible abnormality/ nodule on chest xray. I discussed possibility that he may need CT of chest to completely evaluate this area. I have left 2 phone messages for pt to discuss getting CT scheduled.

## 2013-08-19 NOTE — Telephone Encounter (Signed)
Called patient to advise he needs CT scan, he states he did speak to Dr Audria Nine about this already. Mark Benecke

## 2013-08-19 NOTE — Telephone Encounter (Signed)
Called pt and left a message re: CXR and need to discuss further imaging.

## 2013-08-24 ENCOUNTER — Ambulatory Visit
Admission: RE | Admit: 2013-08-24 | Discharge: 2013-08-24 | Disposition: A | Discharge status: Home / Self Care | Payer: Medicare Other | Source: Ambulatory Visit | Attending: Family Medicine | Admitting: Family Medicine

## 2013-08-24 ENCOUNTER — Telehealth: Payer: Self-pay | Admitting: Family Medicine

## 2013-08-24 DIAGNOSIS — J984 Other disorders of lung: Secondary | ICD-10-CM

## 2013-08-24 DIAGNOSIS — F17210 Nicotine dependence, cigarettes, uncomplicated: Secondary | ICD-10-CM

## 2013-08-24 MED ORDER — IOHEXOL 300 MG/ML  SOLN
75.0000 mL | Freq: Once | INTRAMUSCULAR | Status: AC | PRN
  Administered 2013-08-24: 75 mL via INTRAVENOUS

## 2013-08-24 NOTE — Telephone Encounter (Signed)
I called and left message for pt about chest CT; it was normal except for coronary artery calcification. Suspected abnormality on CXR was vasculature viewed end-on. Advised we discuss coronary artery abnormality (LAD artery) at future visit.

## 2013-09-01 ENCOUNTER — Encounter: Payer: Self-pay | Admitting: Gastroenterology

## 2013-10-10 ENCOUNTER — Ambulatory Visit (AMBULATORY_SURGERY_CENTER): Payer: Self-pay

## 2013-10-10 VITALS — Ht 74.0 in | Wt 260.0 lb

## 2013-10-10 DIAGNOSIS — Z800 Family history of malignant neoplasm of digestive organs: Secondary | ICD-10-CM

## 2013-10-10 MED ORDER — MOVIPREP 100 G PO SOLR: 1.0000 | Freq: Once | ORAL | Status: DC

## 2013-10-12 ENCOUNTER — Encounter: Payer: Self-pay | Admitting: Gastroenterology

## 2013-10-17 ENCOUNTER — Encounter: Payer: Self-pay | Admitting: Gastroenterology

## 2013-10-17 ENCOUNTER — Ambulatory Visit (AMBULATORY_SURGERY_CENTER): Payer: Medicare Other | Admitting: Gastroenterology

## 2013-10-17 VITALS — BP 113/70 | HR 50 | Temp 97.4°F | Resp 21 | Ht 74.0 in | Wt 260.0 lb

## 2013-10-17 DIAGNOSIS — K5730 Diverticulosis of large intestine without perforation or abscess without bleeding: Secondary | ICD-10-CM

## 2013-10-17 DIAGNOSIS — D126 Benign neoplasm of colon, unspecified: Secondary | ICD-10-CM

## 2013-10-17 DIAGNOSIS — Z1211 Encounter for screening for malignant neoplasm of colon: Secondary | ICD-10-CM

## 2013-10-17 DIAGNOSIS — Z800 Family history of malignant neoplasm of digestive organs: Secondary | ICD-10-CM

## 2013-10-17 MED ORDER — SODIUM CHLORIDE 0.9 % IV SOLN: 500.0000 mL | INTRAVENOUS | Status: DC

## 2013-10-17 NOTE — Progress Notes (Signed)
Patient did not experience any of the following events: a burn prior to discharge; a fall within the facility; wrong site/side/patient/procedure/implant event; or a hospital transfer or hospital admission upon discharge from the facility. (G8907)Patient did not have preoperative order for IV antibiotic SSI prophylaxis. (G8918) ewm 

## 2013-10-17 NOTE — Patient Instructions (Addendum)

## 2013-10-17 NOTE — Progress Notes (Signed)
Report to pacu rn, vss, bbs=clear 

## 2013-10-17 NOTE — Op Note (Signed)
[facility] [address] [place], [address]   COLONOSCOPY PROCEDURE REPORT  PATIENT: Edward Watson, Edward Watson  MR#: [id number] BIRTHDATE: 08-Feb-1946 , 67  yrs. old GENDER: Male ENDOSCOPIST: Mardella Layman, MD, [facility] REFERRED XB:[id number] McPherson, M.D. PROCEDURE DATE:  10/17/2013 PROCEDURE:   Colonoscopy with snare polypectomy First Screening Colonoscopy - Avg.  risk and is 50 yrs.  old or older - No.  Prior Negative Screening - Now for repeat screening. N/A  History of Adenoma - Now for follow-up colonoscopy & has been > or = to 3 yrs.  N/A  Polyps Removed Today? Yes. ASA CLASS:   Class III INDICATIONS:average risk screening. MEDICATIONS: Propofol (Diprivan) 370 mg  DESCRIPTION OF PROCEDURE:   After the risks benefits and alternatives of the procedure were thoroughly explained, informed consent was obtained.  A digital rectal exam revealed no abnormalities of the rectum.   The LB [id number]  endoscope was introduced through the anus and advanced to the cecum, which was identified by both the appendix and ileocecal valve. No adverse events experienced.   Limited by poor preparation.   The quality of the prep was poor, using MoviPrep  The instrument was then slowly withdrawn as the colon was fully examined.      COLON FINDINGS: There was severe diverticulosis noted throughout the entire examined colon, in the descending colon, and sigmoid colon with associated muscular hypertrophy, colonic narrowing, colonic spasm and petechiae.   A few smooth flat polyps ranging between 3-64mm in size were found in the rectum.  A polypectomy was performed with a cold snare.  The resection was complete and the polyp tissue was completely retrieved.  Retroflexed views revealed no abnormalities. The time to cecum=7 minutes 3 seconds. Withdrawal time=11 minutes 21 seconds.  The scope was withdrawn and the procedure completed. COMPLICATIONS: There were no  complications.  ENDOSCOPIC IMPRESSION: 1.   There was severe diverticulosis noted throughout the entire examined colon, in the descending colon, and sigmoid colon 2.   Few flat polyps ranging between 3-45mm in size were found in the rectum; polypectomy was performed with a cold snare 3.   Poor prep..cannot ecclude small polyps,  RECOMMENDATIONS: 1.  Await pathology results 2.  Continue current medications 3.  High fiber diet with liberal fluid intake. 4.  Repeat Colonoscopy in 5 years.   eSigned:  Mardella Layman, MD, [facility] 10/17/2013 9:31 AM   cc:   PATIENT NAME:  Edward Watson, Edward Watson MR#: [id number]

## 2013-10-17 NOTE — Progress Notes (Signed)
Called to room to assist during endoscopic procedure.  Patient ID and intended procedure confirmed with present staff. Received instructions for my participation in the procedure from the performing physician.  

## 2013-10-18 ENCOUNTER — Telehealth: Payer: Self-pay

## 2013-10-18 NOTE — Telephone Encounter (Signed)
No answer left message.

## 2013-10-21 ENCOUNTER — Encounter: Payer: Self-pay | Admitting: Gastroenterology

## 2014-02-16 ENCOUNTER — Ambulatory Visit: Payer: Medicare Other | Admitting: Family Medicine

## 2014-03-17 ENCOUNTER — Ambulatory Visit: Payer: Medicare Other | Admitting: Family Medicine

## 2014-03-22 ENCOUNTER — Ambulatory Visit (INDEPENDENT_AMBULATORY_CARE_PROVIDER_SITE_OTHER): Payer: Medicare Other | Admitting: Family Medicine

## 2014-03-22 ENCOUNTER — Encounter: Payer: Self-pay | Admitting: Family Medicine

## 2014-03-22 VITALS — BP 128/74 | HR 79 | Temp 97.7°F | Resp 16 | Ht 73.0 in | Wt 266.0 lb

## 2014-03-22 DIAGNOSIS — E785 Hyperlipidemia, unspecified: Secondary | ICD-10-CM

## 2014-03-22 DIAGNOSIS — I10 Essential (primary) hypertension: Secondary | ICD-10-CM

## 2014-03-22 DIAGNOSIS — N529 Male erectile dysfunction, unspecified: Secondary | ICD-10-CM

## 2014-03-22 MED ORDER — NIACIN ER (ANTIHYPERLIPIDEMIC) 1000 MG PO TBCR: EXTENDED_RELEASE_TABLET | ORAL | Status: DC

## 2014-03-22 MED ORDER — TADALAFIL 10 MG PO TABS: 10.0000 mg | ORAL_TABLET | Freq: Every day | ORAL | Status: DC | PRN

## 2014-03-22 MED ORDER — GEMFIBROZIL 600 MG PO TABS: 600.0000 mg | ORAL_TABLET | Freq: Two times a day (BID) | ORAL | Status: DC

## 2014-03-22 MED ORDER — LOSARTAN POTASSIUM 50 MG PO TABS: 50.0000 mg | ORAL_TABLET | Freq: Every day | ORAL | Status: DC

## 2014-03-22 NOTE — Progress Notes (Signed)
S: This 68 y.o. Cauc male is here for HTN follow-up and medication refills. HE feels well and has no medication adverse effects. He has erectile dysfunction w/ little benefit w/ Viagra; he would like to try a different medication. He continues to enjoy traveling on his motorcycle; he is mentoring his grandson who lives w/ him.  Patient Active Problem List   Diagnosis Date Noted  . Erectile dysfunction 03/22/2014  . Tobacco user 06/22/2013  . Obesity, Class II, BMI 35-39.9, with comorbidity 05/19/2012  . HTN (hypertension) 05/18/2012  . Dyslipidemia 05/18/2012   Prior to Admission medications   Medication Sig Start Date End Date Taking? Authorizing Provider  aspirin 81 MG tablet Take 81 mg by mouth daily.   Yes Historical Provider, MD  gemfibrozil (LOPID) 600 MG tablet Take 1 tablet (600 mg total) by mouth 2 (two) times daily.   Yes Barton Fanny, MD  losartan (COZAAR) 50 MG tablet Take 1 tablet (50 mg total) by mouth daily.   Yes Barton Fanny, MD  Multiple Vitamin (MULTIVITAMIN) tablet Take 1 tablet by mouth daily.   Yes Historical Provider, MD  niacin (NIASPAN) 1000 MG CR tablet Take 1 pill once daily as directed with a low fat snack and aspirin 30 minutes prior to taking medication   Yes Barton Fanny, MD  vitamin C (ASCORBIC ACID) 500 MG tablet Take 500 mg by mouth daily.   Yes Historical Provider, MD          PMHx, Surg Hx, Soc and Fam Hx reviewed.  ROS: Negative for diaphoresis, fatigue, vision disturbances, CP or tightness, palpitations, edema, cough, SOB or DOE, HA, dizziness, numbness or weakness.  O: Filed Vitals:   03/22/14 1100  BP: 128/74  Pulse: 79  Temp: 97.7 F (36.5 C)  Resp: 16   GEN: In NAD: WN,WD. HENT: [place]/AT; EOMI w/ clear conj/sclerae. Otherwise unremarkable. COR: RRR. LUNGS: Normal resp rate and effort. SKIN: W&D; intact. NEURO: A&O x 3; CNs intact. Nonfocal.  A/P: HTN (hypertension) - Stable and controlled.  Plan: losartan (COZAAR) 50  MG tablet  Dyslipidemia - Plan: gemfibrozil (LOPID) 600 MG tablet, niacin (NIASPAN) 1000 MG CR tablet  Erectile dysfunction- Trial Cialis.

## 2014-03-28 ENCOUNTER — Telehealth: Payer: Self-pay

## 2014-03-28 MED ORDER — TADALAFIL 10 MG PO TABS: ORAL_TABLET | ORAL | Status: DC

## 2014-03-28 NOTE — Telephone Encounter (Signed)
Please print RX so patient can mail to [facility].  Thank you.

## 2014-03-28 NOTE — Telephone Encounter (Signed)
I also received a faxed Rx req from [email or web address] and will send it to [facility] via [facility].

## 2014-03-28 NOTE — Telephone Encounter (Signed)
PT STATES HIS INSURANCE WON'T COVER THE CIALIS BUT FOUND OUT THAT [facility] WOULD, HE DIDN'T HAVE THE NUMBER BUT STATED IT IS IN HIS CHART IN NEED OF CIALIS 20MG S WITH #12, IT IS CHEAPER THAT WAY FOR HIM. PLEASE CALL PT AT [phone]     [facility]

## 2014-03-28 NOTE — Telephone Encounter (Signed)
Cialis 20 mg  #12 w/ 3 refills-  prescription printed at 104; contact pt to see if he wants to mail it or should we fax it.

## 2014-03-29 ENCOUNTER — Telehealth: Payer: Self-pay

## 2014-03-29 NOTE — Telephone Encounter (Signed)
Called [facility] back and gave Rx to pharmacist over the phone. Pt does not need to mail Rx. Amy, FYI. The fax and Rx are probably over at 104, but not needed now. Notified pt

## 2014-03-29 NOTE — Telephone Encounter (Signed)
See notes under 03/29/14 message. Rx called in and pt notified.

## 2014-03-29 NOTE — Telephone Encounter (Signed)
Called pt to advise, no answer, do you still have the fax request? I have not gotten this

## 2014-03-29 NOTE — Telephone Encounter (Signed)
Alwyn Ren from [facility] company called in regards to [employer] prescription cialis 10 needing refill.  [phone]

## 2014-08-17 ENCOUNTER — Encounter: Payer: Self-pay | Admitting: Family Medicine

## 2014-08-17 ENCOUNTER — Ambulatory Visit (INDEPENDENT_AMBULATORY_CARE_PROVIDER_SITE_OTHER): Payer: Medicare Other | Admitting: Family Medicine

## 2014-08-17 VITALS — BP 102/86 | HR 98 | Temp 97.8°F | Resp 16 | Ht 73.5 in | Wt 265.0 lb

## 2014-08-17 DIAGNOSIS — I10 Essential (primary) hypertension: Secondary | ICD-10-CM

## 2014-08-17 DIAGNOSIS — E785 Hyperlipidemia, unspecified: Secondary | ICD-10-CM

## 2014-08-17 NOTE — Patient Instructions (Signed)

## 2014-08-17 NOTE — Progress Notes (Signed)
S:  This 68 y.o. Cauc male is here for HTN follow-up. He is compliant w/ all medications w/o adverse effects. He continues to travel long distance on his motorcycle; he has a trip to [facility] planned in next 2 weeks. He denies fatigue, diaphoresis, vision disturbances, CP or tightness, palpitations, SOB or DOE, cough, HA, dizziness, numbness, weakness or syncope.  Patient Active Problem List   Diagnosis Date Noted  . Erectile dysfunction 03/22/2014  . Tobacco user 06/22/2013  . Obesity, Class II, BMI 35-39.9, with comorbidity 05/19/2012  . HTN (hypertension) 05/18/2012  . Dyslipidemia 05/18/2012    Prior to Admission medications   Medication Sig Start Date End Date Taking? Authorizing Provider  aspirin 81 MG tablet Take 81 mg by mouth daily.   Yes Historical Provider, MD  gemfibrozil (LOPID) 600 MG tablet Take 1 tablet (600 mg total) by mouth 2 (two) times daily. 03/22/14  Yes Barton Fanny, MD  losartan (COZAAR) 50 MG tablet Take 1 tablet (50 mg total) by mouth daily. 03/22/14  Yes Barton Fanny, MD  Multiple Vitamin (MULTIVITAMIN) tablet Take 1 tablet by mouth daily.   Yes Historical Provider, MD  niacin (NIASPAN) 1000 MG CR tablet Take 1 pill once daily as directed with a low fat snack and aspirin 30 minutes prior to taking medication 03/22/14  Yes Barton Fanny, MD  tadalafil (CIALIS) 10 MG tablet Take 1/2 - 1 tablet as directed. 03/28/14  Yes Barton Fanny, MD  vitamin C (ASCORBIC ACID) 500 MG tablet Take 500 mg by mouth daily.   Yes Historical Provider, MD    SOC and FAM Hx reviewed.  ROS: As per HPI.  O: Filed Vitals:   08/17/14 0908  BP: 102/86  Pulse: 98  Temp: 97.8 F (36.6 C)  Resp: 16    GEN: In NAD; WN,WD. HENT: [place]/AT; EOMI w/ clear conj/sclerae. Otherwise unremarkable. COR: RRR. LUNGS: Unlabored resp. SKIN: W&D. NEURO: A&O x 3; CNs intact. Nonfocal.  A/P: Essential hypertension- Stable on current medications; encouraged adequate  hydration throughout the day.  Dyslipidemia- Mediterranean Diet and increased physical activity.  RTC in Dec 2015 for CPE/fasting labs.

## 2014-10-25 IMAGING — CR DG CHEST 2V
2 series · 2 of 2 positions shown · non-contrast
Comparison: None.

CLINICAL DATA: History tobacco use.

EXAM:
CHEST  2 VIEW

[PA]
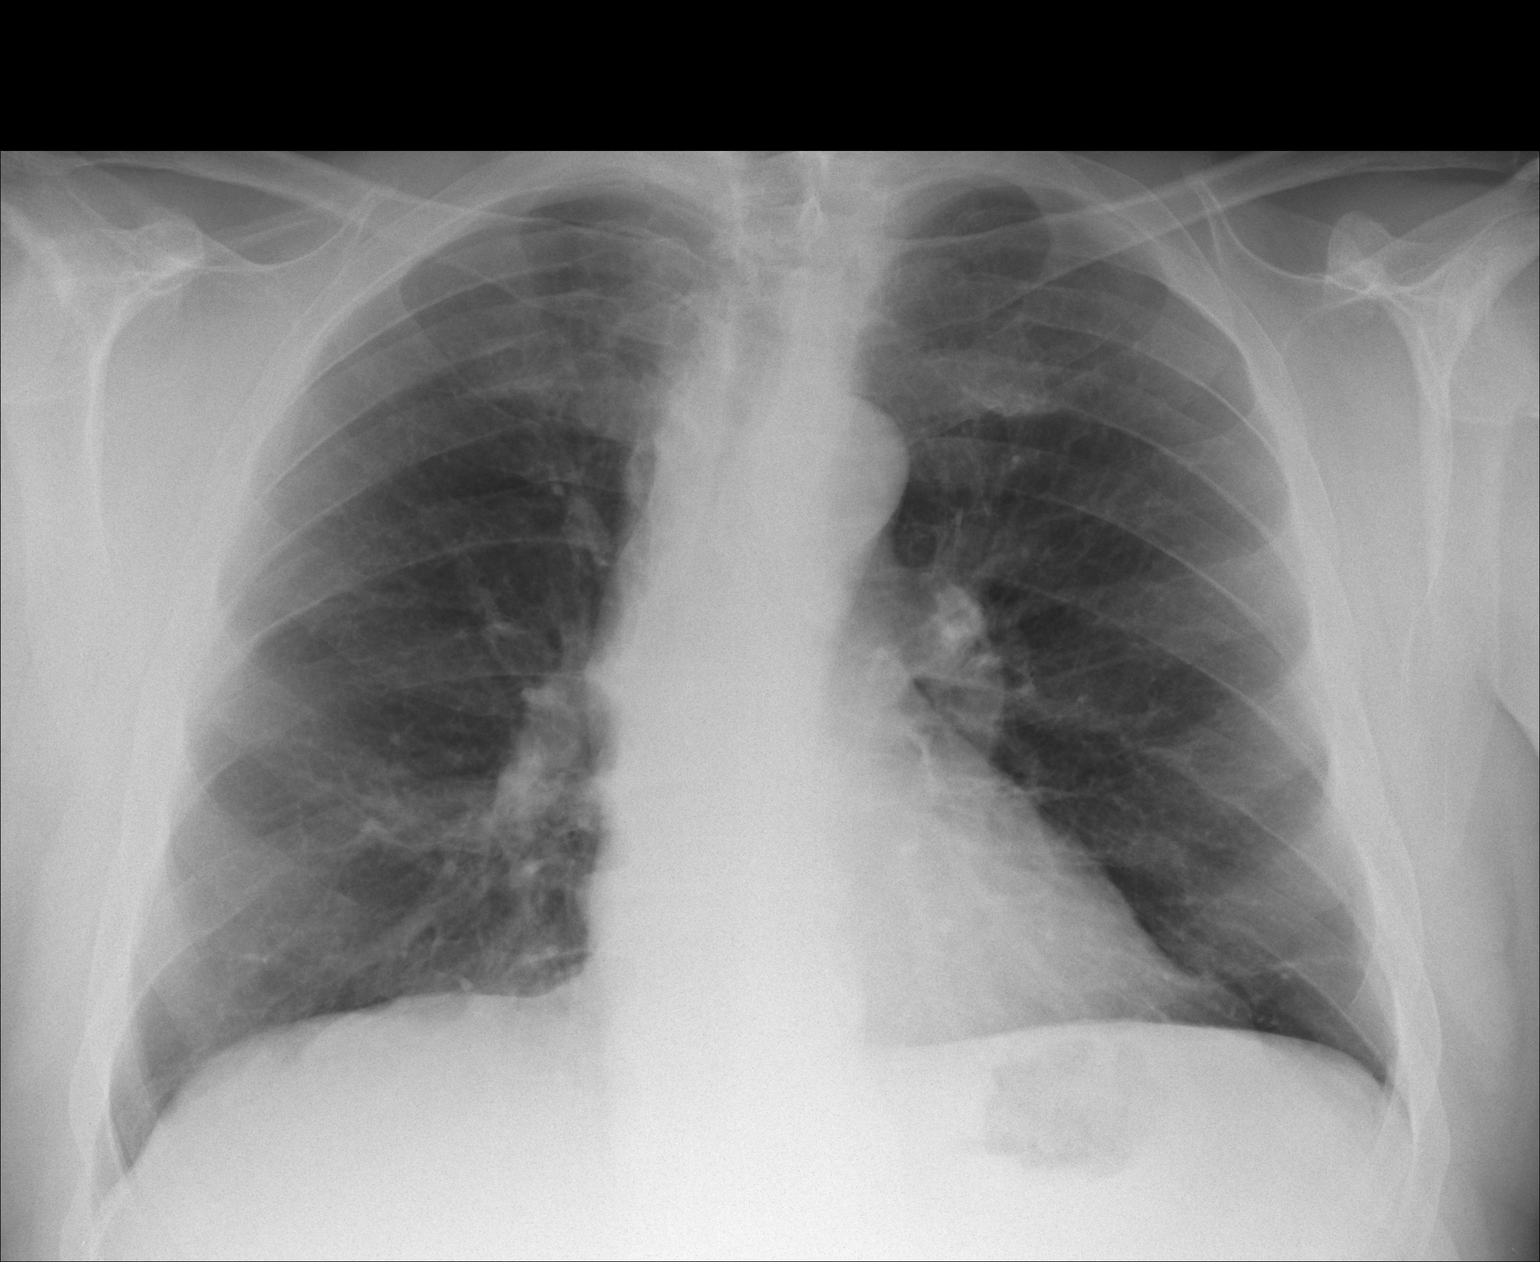

[lateral]
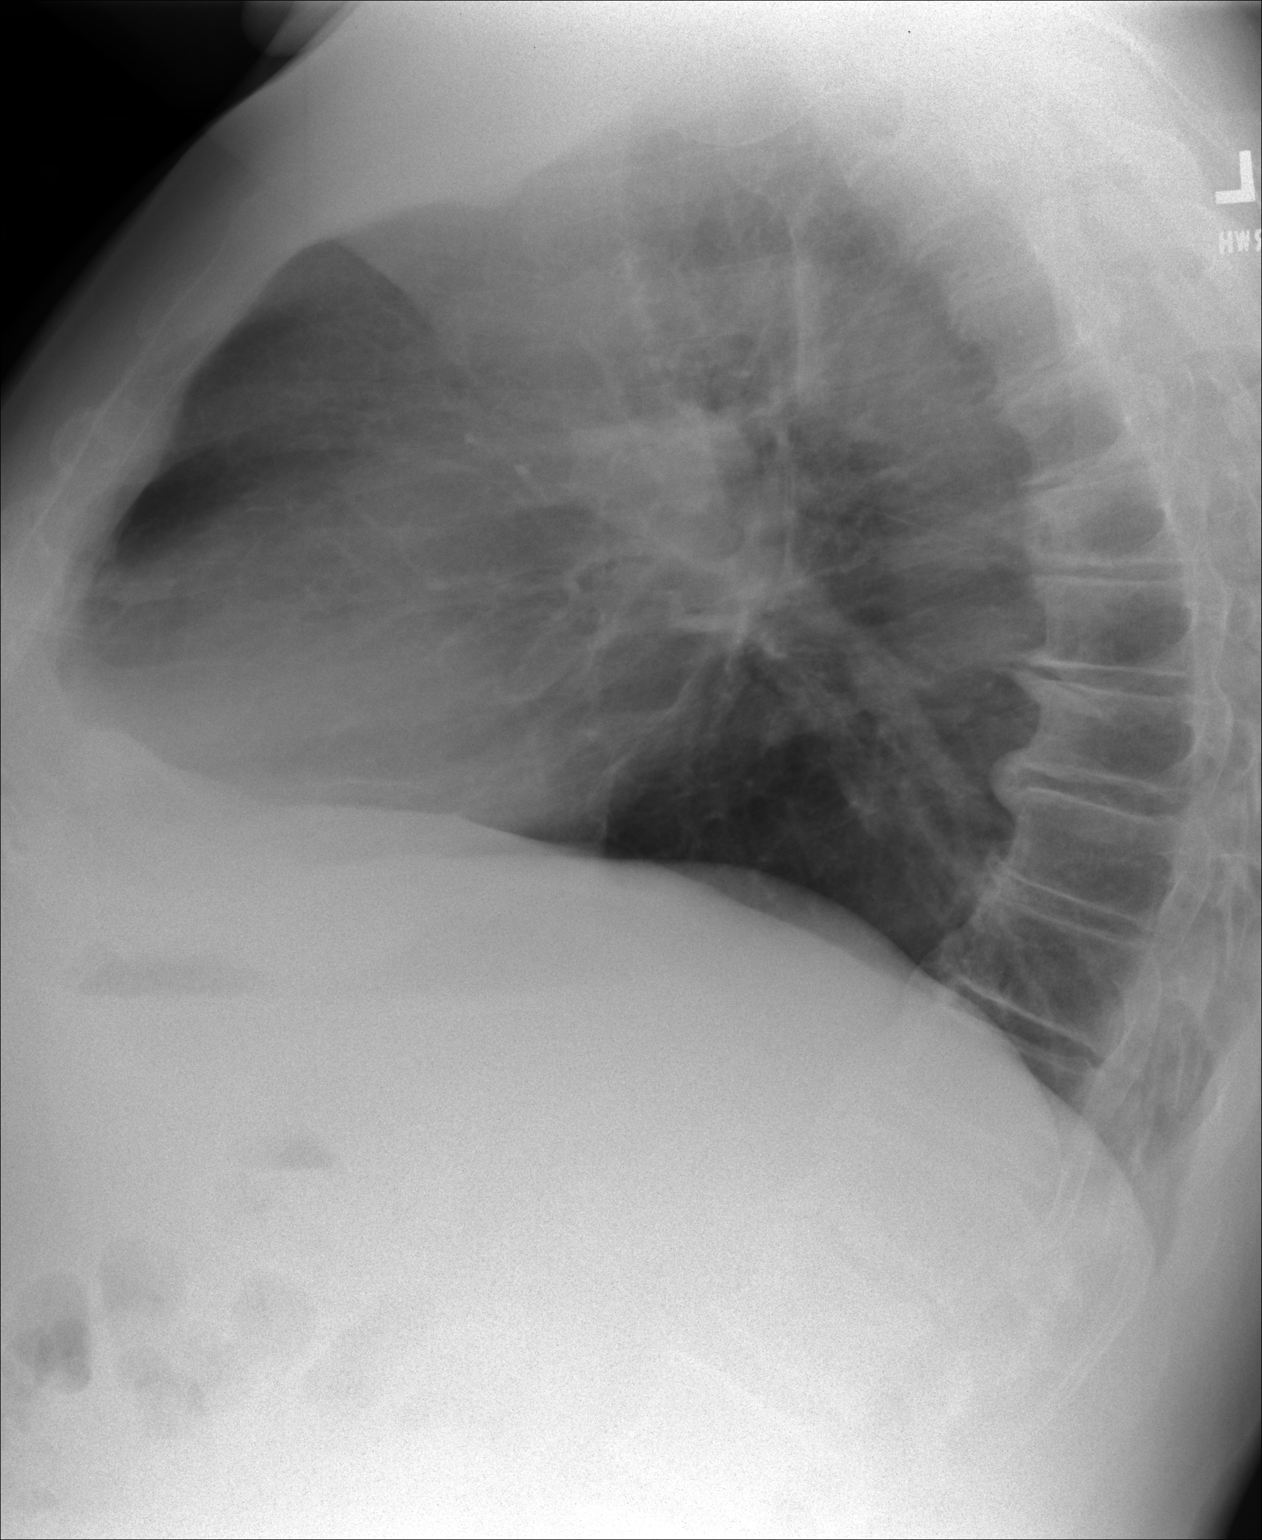

[2 of 2 positions shown; findings below may reference images not displayed]

FINDINGS: No infiltrate, congestive heart failure or pneumothorax.

Nodularity left hilar region may represent crossing vessels. As
there are no comparison examinations and the patient has a history
of smoking, CT of the chest may be considered to exclude left hilar
mass contributing to this appearance (arrow).

Scoliosis thoracic spine convex to the right. Degenerative changes
throughout the thoracic spine.

Heart size within normal limits.
IMPRESSION: No infiltrate, congestive heart failure or pneumothorax. .

Nodularity left hilar region may represent crossing vessels. As
there are no comparison examinations and the patient has a history
of smoking, CT of the chest may be considered to exclude left hilar
mass contributing to this appearance (arrow).

Provider questioned same findings on preliminary report.

## 2014-10-27 ENCOUNTER — Ambulatory Visit (INDEPENDENT_AMBULATORY_CARE_PROVIDER_SITE_OTHER): Payer: Medicare Other | Admitting: Family Medicine

## 2014-10-27 ENCOUNTER — Encounter: Payer: Self-pay | Admitting: Family Medicine

## 2014-10-27 VITALS — BP 120/80 | HR 79 | Temp 97.9°F | Resp 16 | Ht 73.5 in | Wt 266.8 lb

## 2014-10-27 DIAGNOSIS — I10 Essential (primary) hypertension: Secondary | ICD-10-CM

## 2014-10-27 DIAGNOSIS — N400 Enlarged prostate without lower urinary tract symptoms: Secondary | ICD-10-CM

## 2014-10-27 DIAGNOSIS — I499 Cardiac arrhythmia, unspecified: Secondary | ICD-10-CM

## 2014-10-27 DIAGNOSIS — E785 Hyperlipidemia, unspecified: Secondary | ICD-10-CM

## 2014-10-27 DIAGNOSIS — Z0000 Encounter for general adult medical examination without abnormal findings: Secondary | ICD-10-CM

## 2014-10-27 DIAGNOSIS — Z131 Encounter for screening for diabetes mellitus: Secondary | ICD-10-CM

## 2014-10-27 LAB — LIPID PANEL
CHOL/HDL RATIO: 6.8 ratio
Cholesterol: 198 mg/dL (ref 0–200)
HDL: 29 mg/dL — AB (ref >39)
LDL CALC: 144 mg/dL — AB (ref 0–99)
Triglycerides: 125 mg/dL (ref <150)
VLDL: 25 mg/dL (ref 0–40)

## 2014-10-27 LAB — COMPLETE METABOLIC PANEL WITH GFR
ALBUMIN: 4.4 g/dL (ref 3.5–5.2)
ALK PHOS: 64 U/L (ref 39–117)
ALT: 16 U/L (ref 0–53)
AST: 19 U/L (ref 0–37)
BUN: 15 mg/dL (ref 6–23)
CO2: 28 meq/L (ref 19–32)
Calcium: 9.8 mg/dL (ref 8.4–10.5)
Chloride: 101 mEq/L (ref 96–112)
Creat: 0.81 mg/dL (ref 0.50–1.35)
GFR, Est African American: >89 mL/min
Glucose, Bld: 104 mg/dL — ABNORMAL HIGH (ref 70–99)
Potassium: 5 mEq/L (ref 3.5–5.3)
SODIUM: 138 meq/L (ref 135–145)
TOTAL PROTEIN: 6.7 g/dL (ref 6.0–8.3)
Total Bilirubin: 0.8 mg/dL (ref 0.2–1.2)

## 2014-10-27 LAB — POCT GLYCOSYLATED HEMOGLOBIN (HGB A1C): HEMOGLOBIN A1C: 5.8

## 2014-10-27 LAB — POCT URINALYSIS DIPSTICK
Bilirubin, UA: NEGATIVE
Blood, UA: NEGATIVE
Glucose, UA: NEGATIVE
KETONES UA: NEGATIVE
Leukocytes, UA: NEGATIVE
NITRITE UA: NEGATIVE
PH UA: 7
PROTEIN UA: NEGATIVE
Spec Grav, UA: 1.01
UROBILINOGEN UA: 0.2

## 2014-10-27 LAB — THYROID PANEL WITH TSH
Free Thyroxine Index: 2.3 (ref 1.4–3.8)
T3 Uptake: 30 % (ref 22–35)
T4, Total: 7.6 ug/dL (ref 4.5–12.0)
TSH: 1.618 u[IU]/mL (ref 0.350–4.500)

## 2014-10-27 NOTE — Patient Instructions (Addendum)
The Cardiologist can see you today, go there now. [address], Dr Einar Gip, [facility].  Continue your current medications unless the cardiologist advises otherwise.  Please let the specialist know about your plans for the weekend so that you can be advised if you need to make some changes.
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
## 2014-10-28 LAB — PSA, MEDICARE: PSA: 0.52 ng/mL (ref <=4.00)

## 2014-10-30 ENCOUNTER — Encounter: Payer: Self-pay | Admitting: Neurology

## 2014-11-01 NOTE — Progress Notes (Signed)
Quick Note:  Please advise pt regarding following labs... Thyroid function is normal. Prostate blood test is normal. Blood chemistries (salts and blood sugar), kidney and liver function tests are normal.  Total cholesterol is normal; LDL cholesterol is high and HDL ("good") cholesterol is too low. These numbers are important for the Cardiologist to have; please share these results with the specialist. He may have to make some changes in your lipid medications.  Copy to pt. ______

## 2014-11-02 ENCOUNTER — Encounter: Payer: Self-pay | Admitting: *Deleted

## 2014-11-07 IMAGING — CT CT CHEST W/ CM
1 of 2 series · 15 of 32 positions shown, 19 images · IV contrast (omnipaque)
Comparison: Chest x-ray of 08/11/2013

CLINICAL DATA: Questionable nodularity of the left hilum on chest
x-ray

EXAM:
CT CHEST WITH CONTRAST
TECHNIQUE: Multidetector CT imaging of the chest was performed during
intravenous contrast administration.
CONTRAST:  75mL OMNIPAQUE IOHEXOL 300 MG/ML  SOLN

[Series 2: chest with · axial · 0.89mm/px · z∈[-304,-44]mm · 15 of 58 slices shown, 19 images]
[im 3/58  mediastinal]
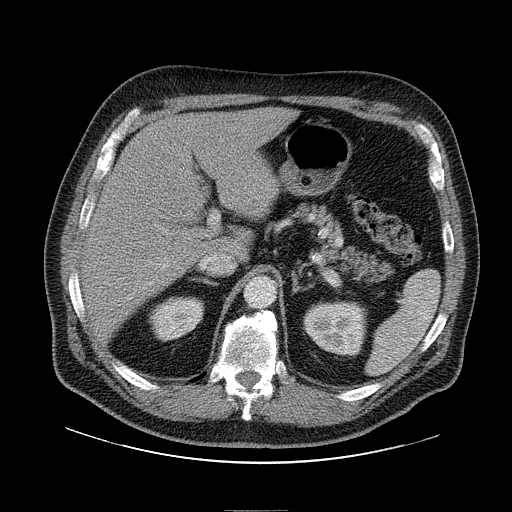
[im 3/58  lung]
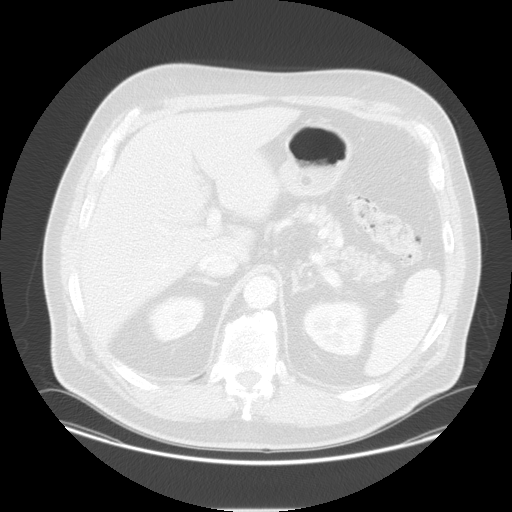
[im 7/58  lung]
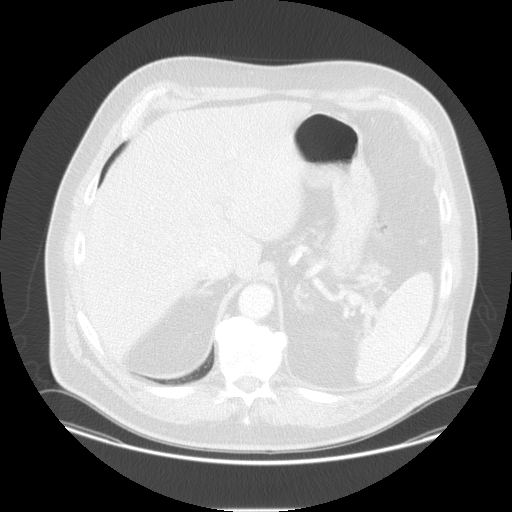
[im 11/58  lung]
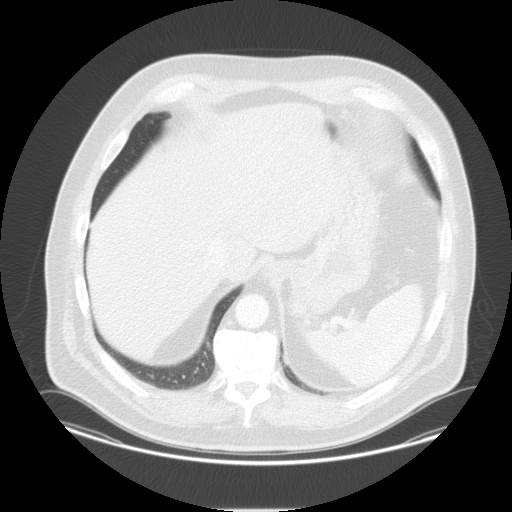
[im 15/58  lung]
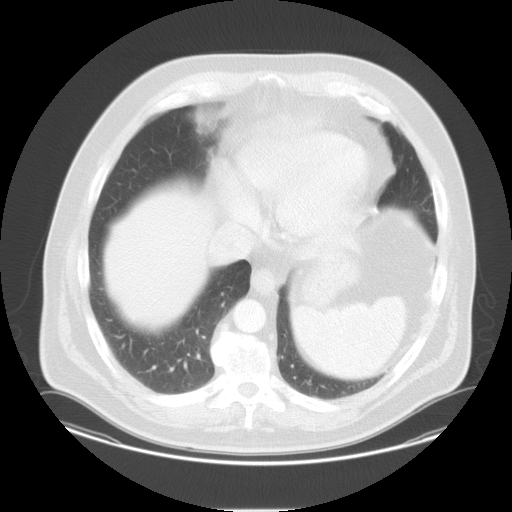
[im 17/58  mediastinal]
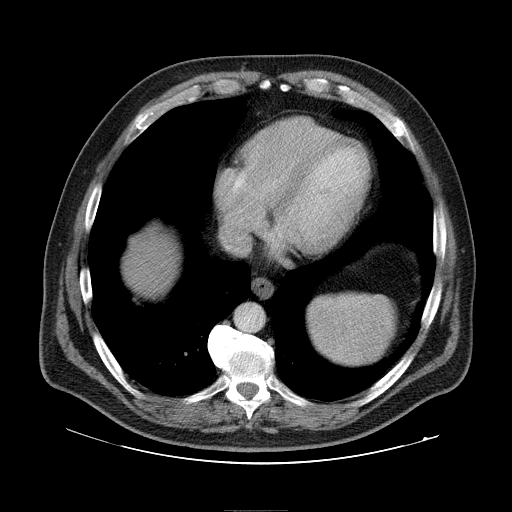
[im 17/58  lung]
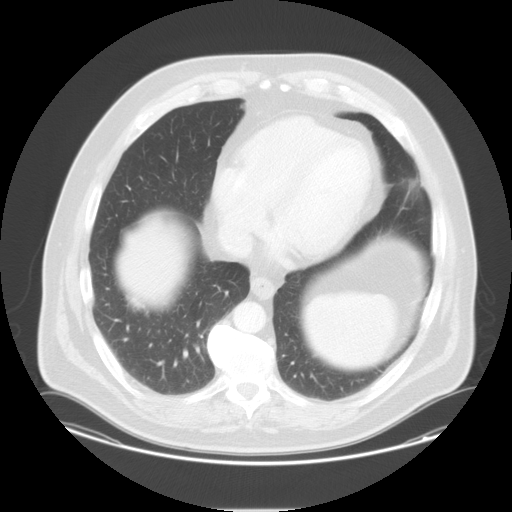
[im 22/58  lung]
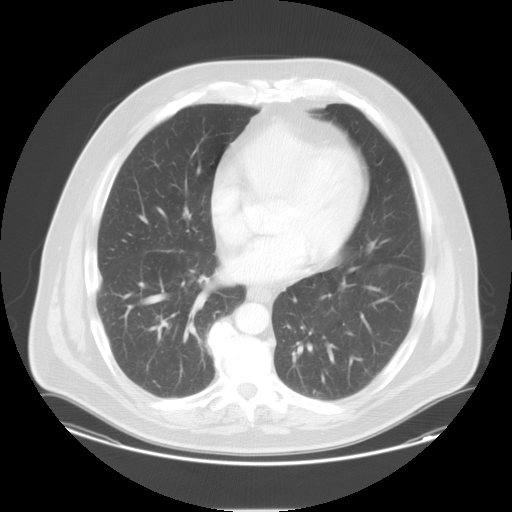
[im 26/58  lung]
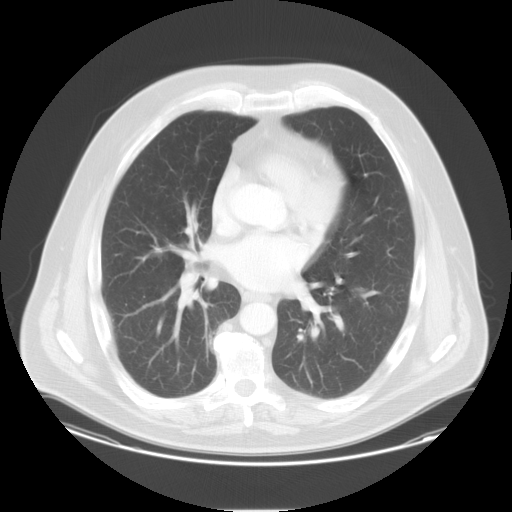
[im 30/58  lung]
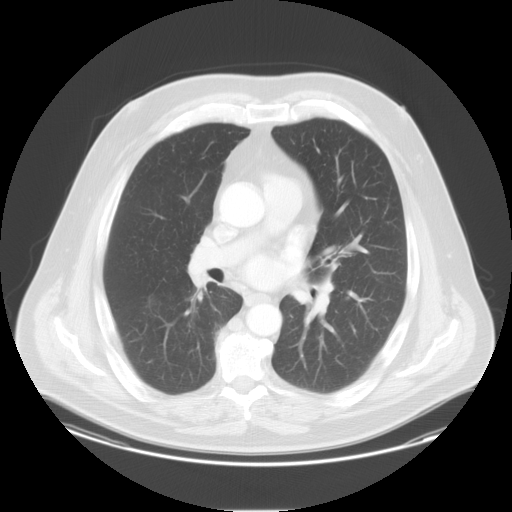
[im 32/58  mediastinal]
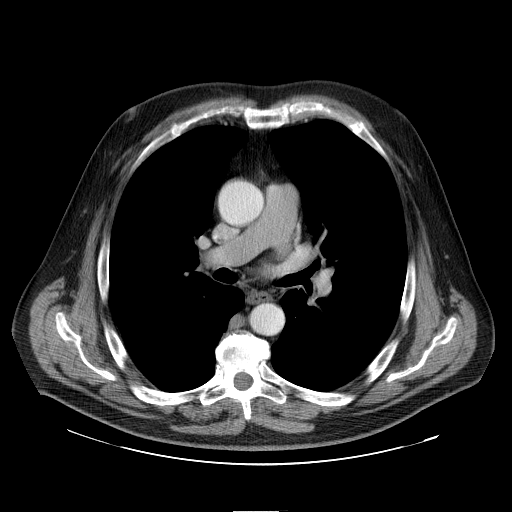
[im 32/58  lung]
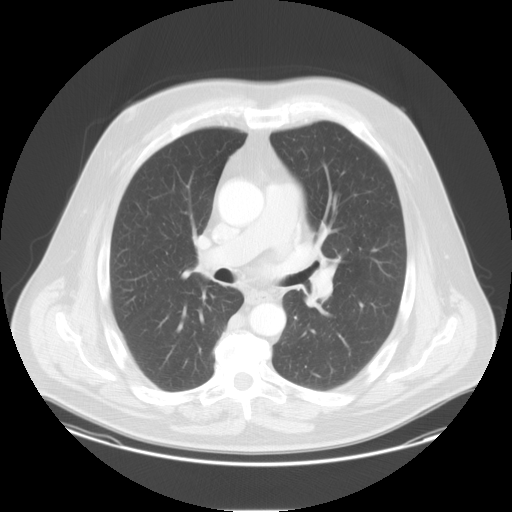
[im 36/58  lung]
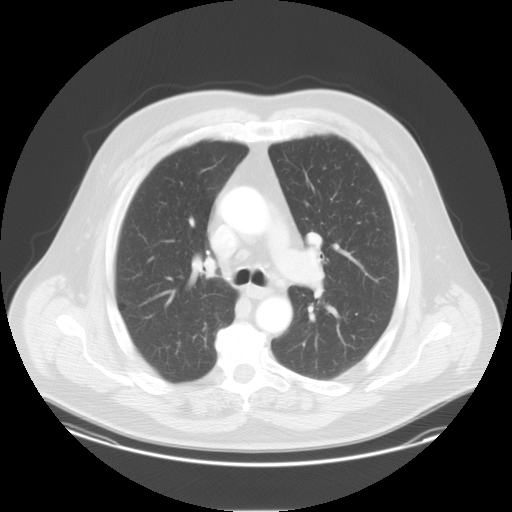
[im 41/58  lung]
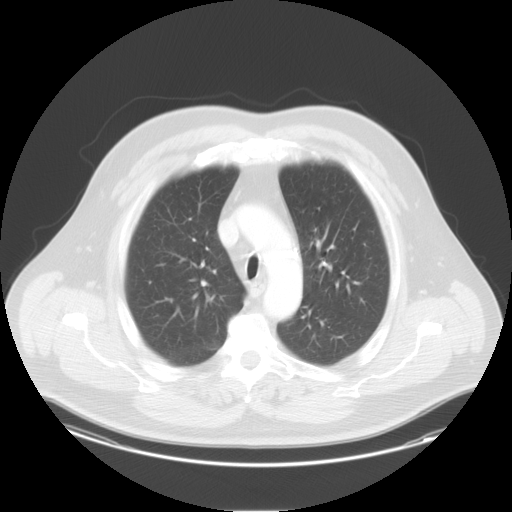
[im 43/58  lung]
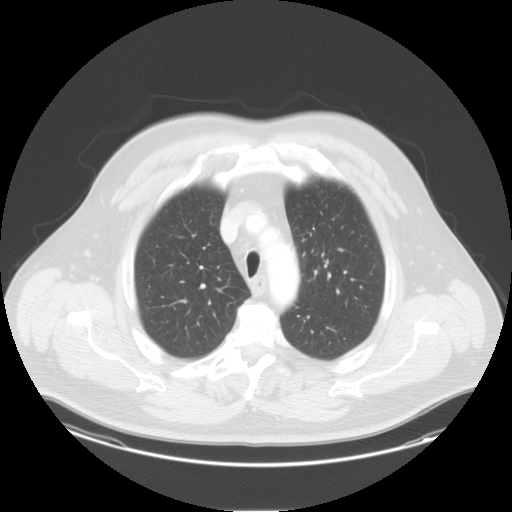
[im 47/58  mediastinal]
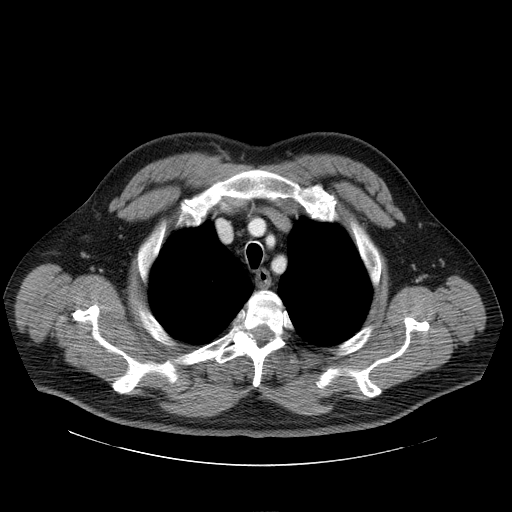
[im 47/58  lung]
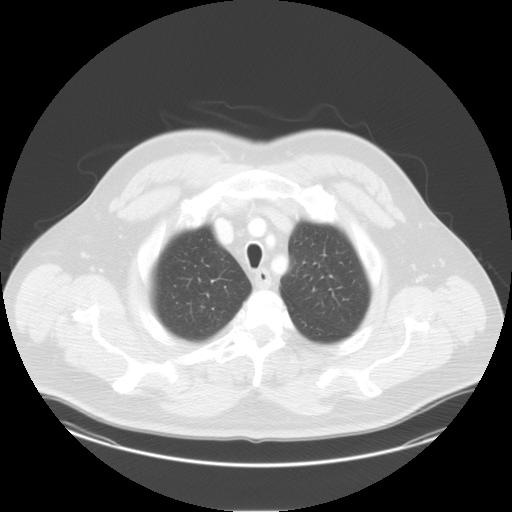
[im 51/58  lung]
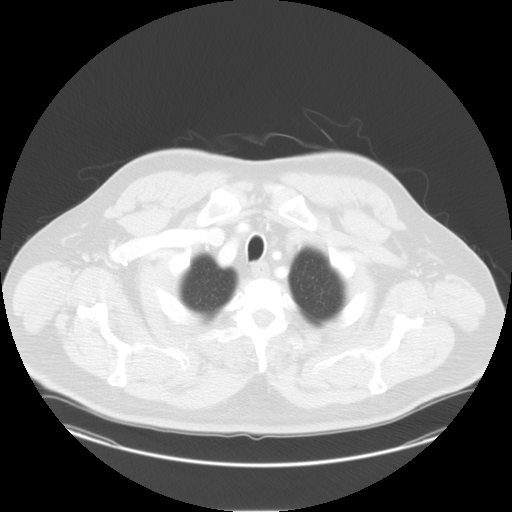
[im 55/58  lung]
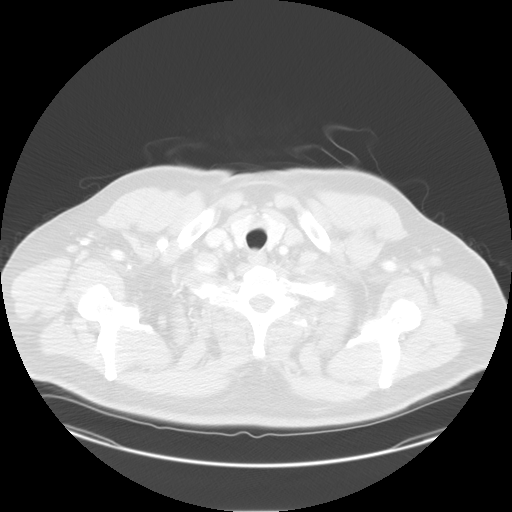

[15 of 32 positions shown; findings below may reference images not displayed]

BUN and creatinine were obtained on site at [HOSPITAL] at
[HOSPITAL].Results: BUN mg/dL, Creatinine 0.7 mg/dL.
FINDINGS: On lung window images, no lung nodule or mass is seen. No infiltrate
is noted. There is no evidence of pleural effusion. The central
airway is patent.

On soft tissue window images, the thyroid gland is unremarkable. The
thoracic aorta opacifies with no significant abnormality and the
origins of the great vessels appear patent. The pulmonary arteries
are not as well opacified but no abnormality is seen. The nodularity
questioned on chest x-ray of on the left, appears to be due to
vascularity seen on-end. No mass or adenopathy is seen. There are
coronary artery calcifications present primarily in the distribution
of the left anterior descending artery. No abnormality of the upper
abdomen is noted.
IMPRESSION: 1. The nodularity of the left hilum questioned on chest x-ray
appears to be due to vascularity. No mass or adenopathy is seen.
2. Coronary artery calcification is noted.

## 2014-11-29 ENCOUNTER — Encounter: Payer: Self-pay | Admitting: Neurology

## 2014-11-30 ENCOUNTER — Encounter: Payer: Self-pay | Admitting: Neurology

## 2014-11-30 ENCOUNTER — Ambulatory Visit (INDEPENDENT_AMBULATORY_CARE_PROVIDER_SITE_OTHER): Payer: PPO | Admitting: Neurology

## 2014-11-30 VITALS — BP 120/81 | HR 83 | Temp 96.6°F | Resp 16 | Ht 73.0 in | Wt 280.0 lb

## 2014-11-30 DIAGNOSIS — R351 Nocturia: Secondary | ICD-10-CM

## 2014-11-30 DIAGNOSIS — E669 Obesity, unspecified: Secondary | ICD-10-CM

## 2014-11-30 DIAGNOSIS — I481 Persistent atrial fibrillation: Secondary | ICD-10-CM

## 2014-11-30 DIAGNOSIS — R0683 Snoring: Secondary | ICD-10-CM

## 2014-11-30 DIAGNOSIS — I4819 Other persistent atrial fibrillation: Secondary | ICD-10-CM
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
## 2014-11-30 NOTE — Patient Instructions (Addendum)
Based on your symptoms and your exam I believe you are at risk for obstructive sleep apnea or OSA, and I think we should proceed with a sleep study to determine whether you do or do not have OSA and how severe it is. If you have more than mild OSA, I want you to consider treatment with CPAP. Please remember, the risks and ramifications of moderate to severe obstructive sleep apnea or OSA are: Cardiovascular disease, including congestive heart failure, stroke, difficult to control hypertension, arrhythmias, and even type 2 diabetes has been linked to untreated OSA. Sleep apnea causes disruption of sleep and sleep deprivation in most cases, which, in turn, can cause recurrent headaches, problems with memory, mood, concentration, focus, and vigilance. Most people with untreated sleep apnea report excessive daytime sleepiness, which can affect their ability to drive. Please do not drive if you feel sleepy.  I will see you back after your sleep study to go over the test results and where to go from there. We will call you after your sleep study and to set up an appointment at the time.   Please reduce your caffeine intake, especially the sweet tea. Drink more water.
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
## 2014-12-05 ENCOUNTER — Telehealth: Payer: Self-pay

## 2014-12-05 DIAGNOSIS — I10 Essential (primary) hypertension: Secondary | ICD-10-CM

## 2014-12-05 DIAGNOSIS — E785 Hyperlipidemia, unspecified: Secondary | ICD-10-CM

## 2014-12-05 NOTE — Telephone Encounter (Signed)
Pt would like rx refills faxed to his new pharmacy. Niacin, losartin, and lopril. Fax # to pharmacy: [phone]  [facility] # for pt : [phone]

## 2014-12-07 MED ORDER — NIACIN ER (ANTIHYPERLIPIDEMIC) 1000 MG PO TBCR: EXTENDED_RELEASE_TABLET | ORAL | Status: DC

## 2014-12-07 MED ORDER — LOSARTAN POTASSIUM 50 MG PO TABS: 50.0000 mg | ORAL_TABLET | Freq: Every day | ORAL | Status: DC

## 2014-12-07 MED ORDER — VERAPAMIL HCL ER 180 MG PO CP24: 180.0000 mg | ORAL_CAPSULE | Freq: Every day | ORAL | Status: DC

## 2014-12-07 NOTE — Telephone Encounter (Signed)
Refills have been ordered 

## 2014-12-12 ENCOUNTER — Telehealth: Payer: Self-pay

## 2014-12-12 DIAGNOSIS — E785 Hyperlipidemia, unspecified: Secondary | ICD-10-CM

## 2014-12-12 DIAGNOSIS — I10 Essential (primary) hypertension: Secondary | ICD-10-CM

## 2014-12-12 MED ORDER — NIACIN ER (ANTIHYPERLIPIDEMIC) 1000 MG PO TBCR: EXTENDED_RELEASE_TABLET | ORAL | Status: DC

## 2014-12-12 MED ORDER — GEMFIBROZIL 600 MG PO TABS: 600.0000 mg | ORAL_TABLET | Freq: Two times a day (BID) | ORAL | Status: DC

## 2014-12-12 MED ORDER — LOSARTAN POTASSIUM 50 MG PO TABS: 50.0000 mg | ORAL_TABLET | Freq: Every day | ORAL | Status: DC

## 2014-12-12 NOTE — Telephone Encounter (Signed)
Pt states he needs his 3 rx faxed to [facility] States he will be using this pharmacy from now on. States he has tried to do this once already and the prescriptions went to his old pharmacy. [facility] Fax # [phone]  losartin Lopid Niacin

## 2014-12-12 NOTE — Telephone Encounter (Signed)
Resent scripts to the requested pharmacy.

## 2014-12-15 ENCOUNTER — Encounter: Payer: Self-pay | Admitting: Family Medicine

## 2014-12-20 ENCOUNTER — Ambulatory Visit (INDEPENDENT_AMBULATORY_CARE_PROVIDER_SITE_OTHER): Payer: PPO | Admitting: Neurology

## 2014-12-20 VITALS — BP 112/76 | HR 59 | Ht 73.0 in | Wt 280.0 lb

## 2014-12-20 DIAGNOSIS — G479 Sleep disorder, unspecified: Secondary | ICD-10-CM

## 2014-12-20 DIAGNOSIS — I4819 Other persistent atrial fibrillation: Secondary | ICD-10-CM

## 2014-12-20 DIAGNOSIS — G4733 Obstructive sleep apnea (adult) (pediatric): Secondary | ICD-10-CM

## 2014-12-21 NOTE — Sleep Study (Signed)
See attached document in the Encounters tab

## 2014-12-27 ENCOUNTER — Encounter: Payer: Self-pay | Admitting: Neurology

## 2014-12-27 ENCOUNTER — Telehealth: Payer: Self-pay | Admitting: Neurology

## 2014-12-27 DIAGNOSIS — G4733 Obstructive sleep apnea (adult) (pediatric): Secondary | ICD-10-CM

## 2014-12-27 DIAGNOSIS — I4819 Other persistent atrial fibrillation: Secondary | ICD-10-CM

## 2014-12-27 NOTE — Telephone Encounter (Signed)
Please call and notify the patient that the recent sleep study did confirm the diagnosis of obstructive sleep apnea - mod to severe - and that I recommend treatment for this in the form of CPAP, especially in light of his recent Dx of A fib. This will require a repeat sleep study for proper titration and mask fitting. Please explain to patient and arrange for a CPAP titration study. I have placed an order in the chart. Thanks, Star Age, MD, PhD [facility])

## 2014-12-28 NOTE — Telephone Encounter (Signed)
Patient was contacted and provided the results of his sleep study which did reveal the presence of obstructive sleep apnea.  The patient was advised a subsequent CPAP titration study to treat his sleep apnea had been recommended given his medical history, specifically A-Fib.  Patient was in agreement and scheduled his CPAP titration study for March 20th at 09:30pm.  Dr. Einar Gip was faxed a copy of the report.  Patient was mailed an appointment card.

## 2015-02-04 ENCOUNTER — Ambulatory Visit (INDEPENDENT_AMBULATORY_CARE_PROVIDER_SITE_OTHER): Payer: PPO | Admitting: Neurology

## 2015-02-04 VITALS — BP 125/79 | HR 58 | Ht 73.0 in | Wt 280.0 lb

## 2015-02-04 DIAGNOSIS — G4733 Obstructive sleep apnea (adult) (pediatric): Secondary | ICD-10-CM | POA: Diagnosis not present

## 2015-02-04 DIAGNOSIS — I4819 Other persistent atrial fibrillation: Secondary | ICD-10-CM

## 2015-02-04 DIAGNOSIS — G479 Sleep disorder, unspecified: Secondary | ICD-10-CM

## 2015-02-05 NOTE — Sleep Study (Signed)
See scanned documents in Encounters tab

## 2015-02-06 ENCOUNTER — Ambulatory Visit (HOSPITAL_COMMUNITY)
Admission: RE | Admit: 2015-02-06 | Discharge: 2015-02-06 | Disposition: A | Discharge status: Home / Self Care | Payer: PPO | Source: Ambulatory Visit | Attending: Cardiology | Admitting: Cardiology

## 2015-02-06 ENCOUNTER — Encounter (HOSPITAL_COMMUNITY)
Admission: RE | Disposition: A | Discharge status: Home / Self Care | Payer: PPO | Source: Ambulatory Visit | Attending: Cardiology

## 2015-02-06 DIAGNOSIS — I4891 Unspecified atrial fibrillation: Secondary | ICD-10-CM | POA: Insufficient documentation

## 2015-02-06 DIAGNOSIS — Z538 Procedure and treatment not carried out for other reasons: Secondary | ICD-10-CM | POA: Diagnosis not present

## 2015-02-06 SURGERY — CANCELLED PROCEDURE

## 2015-02-06 NOTE — Progress Notes (Signed)
Per pt he can not remember if he took his Xarelto on Sunday night (02/04/15), he is "pretty sure that he did NOT take it." Dr. Einar Gip made aware, per Dr. Einar Gip pt's cardioversion will need to be rescheduled. Rudell Ortman, rn

## 2015-02-21 ENCOUNTER — Telehealth: Payer: Self-pay | Admitting: Neurology

## 2015-02-21 DIAGNOSIS — G4733 Obstructive sleep apnea (adult) (pediatric): Secondary | ICD-10-CM

## 2015-02-21 NOTE — Telephone Encounter (Signed)
Please call and inform patient that I have entered an order for treatment with PAP. He did reasonably well during the latest sleep study with CPAP. We will, therefore, arrange for a machine for home use through a DME (durable medical equipment) company of His choice; and I will see the patient back in follow-up in about 6 weeks. Please also explain to the patient that I will be looking out for compliance data downloaded from the machine, which can be done remotely through a modem at times or stored on an SD card in the back of the machine. At the time of the followup appointment we will discuss sleep study results and how it is going with PAP treatment at home. Please advise patient to bring His machine at the time of the visit; at least for the first visit, even though this is cumbersome. Bringing the machine for every visit after that may not be needed, but often helps for the first visit. Please also make sure, the patient has a follow-up appointment with me in about 6 weeks from the setup date, thanks.   Star Age, MD, PhD [facility])

## 2015-02-22 ENCOUNTER — Encounter: Payer: Self-pay | Admitting: Neurology

## 2015-02-23 NOTE — Telephone Encounter (Signed)
Left message to call back  

## 2015-03-01 NOTE — Telephone Encounter (Signed)
I spoke to patient, he is aware of results. Macari would like to speak with Dr. Einar Gip before proceeding with using CPAP. He states that he will call back after speaking with Dr. Einar Gip.

## 2015-03-18 NOTE — H&P (Signed)
OFFICE VISIT NOTES COPIED TO EPIC FOR DOCUMENTATION  Edward Watson 01/02/2015 2:06 PM Location: [facility] Patient #: [id number] DOB: 11-Mar-1946 Widowed / Language: Edward Watson / Race: White Male    History of Present Illness(Edward Carlynn Herald, MD; 01/02/2015 2:57 PM) The patient is a 69 year old male who presents for a recheck of Atrial fibrillation. Edward Watson is a 68 year old Caucasian male with a history of hypertension and dyslipidemia who presented for evaluation of new onset atrial fibrillation a month ago. He states he went for his annual physical with his PCP on 10/27/2014 and had a routine EKG which showed he was in A. fib. His last EKG was at his physical one year ago and he was in sinus rhythm.  He denies any acute symptoms although he states he has noticed mild fatigue. Denies palpitations, shortness of breath, chest pain. Labs including lipids, CMP, and TSH were ordered by PCP. TSH and CMP normal. Lipids show elevated LDL and low HDL. We had discussed proceeding with cardioversion but decided to wait until after his appt with Dr. Rexene Watson to be evaluated for sleep apnea. He had a sleep study on 12/20/2014 which revealed moderate to severe sleep apnea but he decided not to proceed with CPAP titration study. He is still interested in proceeding with cardioversion but would like to wait until the end of March as he is going on a trip.   He denies PND, orthopnea, or daytime somnolence. He denies edema, syncope, or symptoms suggestive of claudication or TIA. No bleeding diasthesis on Xarelto. Overall, he is feeling well and denies any new symptoms or concerns today.     Problem List/Past Medical(Edward Carlynn Herald, MD; 01/02/2015 3:03 PM) History of snoring (Z87.898). Sleep study 12/20/2014: Moderate to severe obstructive sleep apnea, will be contacted to set up CPAP titration study  Office visit 11/30/2014 Edward Alberts, MD): Recommended sleep study with potential  positive airway pressure titration. Moderate obesity (E66.8) Erectile dysfunction (N52.9) Tobacco use disorder, continuous (F17.209) Essential hypertension, benign (I10) Hyperlipidemia, group A (E78.0). Labs 10/27/2014: total cholesterol 198, triglycerides 125, HDL 29, LDL 144, Thyroid panel normal, creatinine 0.81, CMP normal, New onset atrial fibrillation (I48.91). CHA2DS2-VASc Score is 2 with yearly risk of stroke of 2.2 %. Rec [place]. HAS-Bled score is 1 and estimated major bleeding in one year is 1-1.5 %  Echocardiogram 11/21/2014: Poor parasternal views due to bodily habitus. Left ventricle cavity is normal in size. Moderate concentric hypertrophy of the left ventricle. Grossly normal global wall motion. Unable to evaluate diastolic function due to A. Fibrillation. Calculated EF 55%. Left atrial cavity is severely dilated. Right atrial cavity is moderate to severely dilated. Right ventricle cavity is borderline dilated. Normal right ventricular function. IVC is dilated with respiratory variation. This suggests elevated right heart pressure.  Lexiscan myoview stress test 11/20/2014: 1. The resting electrocardiogram demonstrated atrial fibrillation and incomplete RBBB. The stress electrocardiogram was non-diagnostic due to pharmacological stress testing with Lexican. The stress test was terminated because of dyspnea and end of protocol. 2. SPECT images demonstrate homogeneous tracer distribution throughout the myocardium. The left ventricular ejection fraction was calculated or visually estimated to be 38%. The estimation of the EF could be an error due to A. fibrillation. Clinical correlation recommended, this is a low risk scan. Benign positional vertigo, bilateral (H81.13) Other emphysema (J43.8)    Allergies(Edward Watson; 01/02/2015 2:07 PM) No Known Drug Allergies. 10/27/2014    Family History(Edward Watson; 01/02/2015 2:07 PM) Sister 4. does not know them Father.  Deceased. did not know his father Mother. Deceased. does not know mother Brother 1. does not know him    Social History(Edward Watson; 01/02/2015 2:07 PM) Living Situation. Lives alone. grandson lives with him Number of Children. 1. Current tobacco use. Former smoker. Quit 10/27/2014 Alcohol Use. Occasional alcohol use. beer or two a month Marital status. Widowed.    Past Surgical History(Edward Watson; 01/02/2015 2:07 PM) Tonsillectomy. 1957    Medication History(Edward Watson; 01/02/2015 2:07 PM) Verapamil HCl ER (240MG  Capsule ER 24HR, 1 (one) Oral daily, Taken starting 12/05/2014) Active. (d/c 180mg ) Xarelto (20MG  Tablet, 1 (one) Tablet Oral every evening after dinner, Taken starting 12/05/2014) Active. Gemfibrozil (600MG  Tablet, 1 Oral two times daily) Active. Niacin ER (1000MG  Tablet ER, 1 Oral daily) Active. Losartan Potassium (50MG  Tablet, 1 Oral daily) Active. Aspirin EC Low Strength (81MG  Tablet DR, 1 Oral daily) Active. Fish Oil 1000 Mg (2 two times daily) Active. Multivitamin (1 daily) Active.    Diagnostic Studies History(Edward Watson; 01/02/2015 2:13 PM) Colonoscopy. 2014 Normal. CT Scan of Chest. 08/24/2013 Normal. coronary artery calcification present primarily in the distribution of the LAD Nuclear stress test. 11/20/2014 1. The resting electrocardiogram demonstrated atrial fibrillation and incomplete RBBB. The stress electrocardiogram was non-diagnostic due to pharmacological stress testing with Lexican. The stress test was terminated because of dyspnea and end of protocol. 2. SPECT images demonstrate homogeneous tracer distribution throughout the myocardium. The left ventricular ejection fraction was calculated or visually estimated to be 38%. The estimation of the EF could be an error due to A. fibrillation. Clinical correlation recommended, this is a low risk scan. Echocardiogram. 11/21/2014 Poor parasternal views due to bodily habitus.  Left ventricle cavity is normal in size. Moderate concentric hypertrophy of the left ventricle. Grossly normal global wall motion. Unable to evaluate diastolic function due to A. Fibrillation. Calculated EF 55%. Left atrial cavity is severely dilated. Right atrial cavity is moderate to severely dilated. Right ventricle cavity is borderline dilated. Normal right ventricular function. IVC is dilated with respiratory variation. This suggests elevated right heart pressure. Sleep Study. 12/20/2014 Positive for Sleep Apnea; does not use CPAP    Review of Systems(Edward Carlynn Herald, MD; 01/02/2015 3:03 PM) General:Not Present- Anorexia, Fatigue (mild fatigue) and Fever. Respiratory:Not Present- Cough, Decreased Exercise Tolerance and Dyspnea. Cardiovascular:Not Present- Chest Pain, Claudications, Edema, Orthopnea, Palpitations, Paroxysmal Nocturnal Dyspnea and Shortness of Breath. Gastrointestinal:Not Present- Change in Bowel Habits, Constipation and Nausea. Neurological:Present- Dizziness and Vertigo. Not Present- Focal Neurological Symptoms. Endocrine:Not Present- Appetite Changes, Cold Intolerance and Heat Intolerance. Hematology:Not Present- Anemia, Petechiae and Prolonged Bleeding.    Vitals(Edward Watson; 01/02/2015 2:16 PM) 01/02/2015 2:08 PM Weight: 283 lb Height: 74 in Body Surface Area: 2.59 m Body Mass Index: 36.33 kg/m Pulse: 88 (Regular) P.OX: 96% (Room air) BP: 114/76 (Sitting, Left Arm, Standard)     Physical Exam(Edward Carlynn Herald, MD; 01/02/2015 3:07 PM) The physical exam findings are as follows:   General Mental Status- Alert. General Appearance- Cooperative and Appears stated age. Not in acute distress. Orientation- Oriented X3. Build & Nutrition- Well built and Moderately obese.   Head and Neck Thyroid Gland Characteristics- no palpable nodules. no palpable enlargement.   Chest and Lung Exam Palpation:Tender- No chest wall  tenderness. Auscultation: Adventitious sounds:Expiratory & inspiratory wheeze- Both Lung Fields.   Cardiovascular Inspection:Jugular vein- Right- No Distention. Auscultation:Rhythm- Irregularly irregular and Tachycardic. Heart Sounds- S1 is variable, S2 WNL and No gallop present. Murmurs & Other Heart Sounds: Murmur:- No murmur.   Abdomen Palpation/Percussion:Palpation and Percussion  of the abdomen reveal - Non Tender and No hepatosplenomegaly. Auscultation:Auscultation of the abdomen reveals - Bowel sounds normal.   Peripheral Vascular Lower Extremity:Inspection- Left- No Pigmentation or Varicose veins. Right- No Pigmentation or Varicose veins. Palpation:Edema- Bilateral- No edema. Femoral pulse- Bilateral- Normal. Popliteal pulse- Bilateral- Normal. Dorsalis pedis pulse- Bilateral- Normal. Posterior tibial pulse- Bilateral- Normal. Carotid arteries- Bilateral- No Carotid bruit. Abdomen- No prominent abdominal aortic pulsation or epigastric bruit.   Neurologic Motor:- Grossly intact without any focal deficits.   Musculoskeletal Global Assessment Left Lower Extremity - normal range of motion without pain. Right Lower Extremity- normal range of motion without pain.    Assessment & Plan(Edward Carlynn Herald, MD; 01/02/2015 3:09 PM) Persistent atrial fibrillation (I48.1) Story: CHA2DS2-VASc Score is 2 with yearly risk of stroke of 2.2 %. Rec [place]. HAS-Bled score is 1 and estimated major bleeding in one year is 1-1.5 %  Echocardiogram 11/21/2014: Poor parasternal views due to bodily habitus. Left ventricle cavity is normal in size. Moderate concentric hypertrophy of the left ventricle. Grossly normal global wall motion. Unable to evaluate diastolic function due to A. Fibrillation. Calculated EF 55%. Left atrial cavity is severely dilated. Right atrial cavity is moderate to severely dilated. Right ventricle cavity is borderline dilated. Normal right  ventricular function. IVC is dilated with respiratory variation. This suggests elevated right heart pressure.  Lexiscan myoview stress test 11/20/2014: 1. The resting electrocardiogram demonstrated atrial fibrillation and incomplete RBBB. The stress electrocardiogram was non-diagnostic due to pharmacological stress testing with Lexican. The stress test was terminated because of dyspnea and end of protocol. 2. SPECT images demonstrate homogeneous tracer distribution throughout the myocardium. The left ventricular ejection fraction was calculated or visually estimated to be 38%. The estimation of the EF could be an error due to A. fibrillation. Clinical correlation recommended, this is a low risk scan. Impression: EKG 01/02/2015: Atrial flutter ablation with controlled response at the rate of 77 bpm, incomplete right bundle branch block otherwise no evidence of ischemia and normal QT interval. No significant change from EKG 12/01/2014: Atrial fibrillation with rapid response at the rate of 108 bpm. Current Plans l Complete electrocardiogram (93000) l Started Flecainide Acetate 100MG , 1 (one) Tablet two times daily, #60, 01/02/2015, Ref. x3.  Obstructive sleep apnea, adult (Z61.09) Story: Sleep study 12/20/2014: Moderate to severe obstructive sleep apnea Recommended sleep study with potential positive airway pressure titration.  Essential hypertension, benign (I10) Current Plans l Mechanism of underlying disease process and action of medications discussed with the patient. I discussed primary/secondary prevention and also dietary counceling was done. Patient is a stress for follow-up of atrial fibrillation, probably several months in duration with regard to the onset. I have had a long discussion with the patient regarding moderate obstructive sleep apnea, risk factors associated with sleep apnea and its effects on overall health. By echocardiogram, patient already has mild right ventricular  dilatation, this may suggest pulmonary hypertension. We discussed regarding congestive heart failure as a manifestation.  Patient is now more willing to consider CPAP titration study. With regard to atrial fibrillation, although he has markedly enlarged left atrium, this may be related to new onset of atrial fibrillation, I will start the patient on flecainide 100 mg by mouth twice a day and scheduled him for cardioversion, due to patient schedule in 3-4 weeks. If successful we'll continue flecainide. Otherwise he can manage him with rate control strategy only, patient does have mild fatigue associated with atrial fibrillation. 2/60/2016  l Referred to Ellsworth Lennox MD (Internal  Medicine).    Signed electronically by Laverda Page, MD (01/02/2015 3:09 PM)

## 2015-03-20 ENCOUNTER — Ambulatory Visit (HOSPITAL_COMMUNITY): Payer: PPO | Admitting: Anesthesiology

## 2015-03-20 ENCOUNTER — Ambulatory Visit (HOSPITAL_COMMUNITY)
Admission: RE | Admit: 2015-03-20 | Discharge: 2015-03-20 | Disposition: A | Discharge status: Home / Self Care | Payer: PPO | Source: Ambulatory Visit | Attending: Cardiology | Admitting: Cardiology

## 2015-03-20 ENCOUNTER — Encounter (HOSPITAL_COMMUNITY): Payer: Self-pay | Admitting: Anesthesiology

## 2015-03-20 ENCOUNTER — Telehealth: Payer: Self-pay | Admitting: Neurology

## 2015-03-20 ENCOUNTER — Encounter (HOSPITAL_COMMUNITY)
Admission: RE | Disposition: A | Discharge status: Home / Self Care | Payer: Self-pay | Source: Ambulatory Visit | Attending: Cardiology

## 2015-03-20 DIAGNOSIS — E780 Pure hypercholesterolemia: Secondary | ICD-10-CM | POA: Insufficient documentation

## 2015-03-20 DIAGNOSIS — I481 Persistent atrial fibrillation: Secondary | ICD-10-CM | POA: Diagnosis not present

## 2015-03-20 DIAGNOSIS — Z7982 Long term (current) use of aspirin: Secondary | ICD-10-CM | POA: Diagnosis not present

## 2015-03-20 DIAGNOSIS — G4733 Obstructive sleep apnea (adult) (pediatric): Secondary | ICD-10-CM | POA: Diagnosis not present

## 2015-03-20 DIAGNOSIS — Z87891 Personal history of nicotine dependence: Secondary | ICD-10-CM | POA: Insufficient documentation

## 2015-03-20 DIAGNOSIS — Z7901 Long term (current) use of anticoagulants: Secondary | ICD-10-CM | POA: Diagnosis not present

## 2015-03-20 DIAGNOSIS — I4891 Unspecified atrial fibrillation: Secondary | ICD-10-CM | POA: Diagnosis present

## 2015-03-20 DIAGNOSIS — I10 Essential (primary) hypertension: Secondary | ICD-10-CM | POA: Insufficient documentation

## 2015-03-20 DIAGNOSIS — Z79899 Other long term (current) drug therapy: Secondary | ICD-10-CM | POA: Diagnosis not present

## 2015-03-20 DIAGNOSIS — Z6835 Body mass index (BMI) 35.0-35.9, adult: Secondary | ICD-10-CM | POA: Insufficient documentation

## 2015-03-20 HISTORY — PX: CARDIOVERSION: SHX1299

## 2015-03-20 LAB — POCT I-STAT, CHEM 8
BUN: 15 mg/dL (ref 6–20)
CHLORIDE: 103 mmol/L (ref 101–111)
Calcium, Ion: 1.25 mmol/L (ref 1.13–1.30)
Creatinine, Ser: 0.8 mg/dL (ref 0.61–1.24)
Glucose, Bld: 113 mg/dL — ABNORMAL HIGH (ref 70–99)
HEMATOCRIT: 44 % (ref 39.0–52.0)
Hemoglobin: 15 g/dL (ref 13.0–17.0)
POTASSIUM: 4.4 mmol/L (ref 3.5–5.1)
Sodium: 139 mmol/L (ref 135–145)
TCO2: 21 mmol/L (ref 0–100)

## 2015-03-20 SURGERY — CARDIOVERSION
Anesthesia: Monitor Anesthesia Care

## 2015-03-20 MED ORDER — SODIUM CHLORIDE 0.9 % IV SOLN
INTRAVENOUS | Status: DC | PRN
  Administered 2015-03-20: 12:00:00 via INTRAVENOUS

## 2015-03-20 MED ORDER — SODIUM CHLORIDE 0.9 % IV SOLN
INTRAVENOUS | Status: DC
  Administered 2015-03-20: 11:00:00 via INTRAVENOUS

## 2015-03-20 MED ORDER — PROPOFOL 10 MG/ML IV BOLUS
INTRAVENOUS | Status: DC | PRN
  Administered 2015-03-20: 60 mg via INTRAVENOUS
  Administered 2015-03-20 (×2): 20 mg via INTRAVENOUS

## 2015-03-20 MED ORDER — LIDOCAINE HCL (PF) 2 % IJ SOLN
INTRAMUSCULAR | Status: DC | PRN
  Administered 2015-03-20: 60 mg via INTRADERMAL

## 2015-03-20 NOTE — Telephone Encounter (Signed)
Pt called office wanting to speak to nurse about proceeding with use of CPAP. Pt states he has followed up with Dr Einar Gip and is ready to start process so that he can be placed on CPAP and resolve his sleep issues. Please contact pt and advise.

## 2015-03-20 NOTE — Anesthesia Postprocedure Evaluation (Signed)
  Anesthesia Post-op Note  Patient: Edward Watson  Procedure(s) Performed: Procedure(s): CARDIOVERSION (N/A)  Patient Location: PACU  Anesthesia Type:MAC  Level of Consciousness: awake and alert   Airway and Oxygen Therapy: Patient Spontanous Breathing  Post-op Pain: none  Post-op Assessment: Post-op Vital signs reviewed, Patient's Cardiovascular Status Stable, Respiratory Function Stable, Patent Airway, No signs of Nausea or vomiting and Pain level controlled  Post-op Vital Signs: Reviewed and stable  Last Vitals:  Filed Vitals:   03/20/15 1230  BP: 126/80  Pulse: 78  Temp:   Resp: 17    Complications: No apparent anesthesia complications

## 2015-03-20 NOTE — Transfer of Care (Signed)
Immediate Anesthesia Transfer of Care Note  Patient: Edward Watson  Procedure(s) Performed: Procedure(s): CARDIOVERSION (N/A)  Patient Location: Endoscopy Unit  Anesthesia Type:MAC  Level of Consciousness: awake, alert  and oriented  Airway & Oxygen Therapy: Patient Spontanous Breathing and Patient connected to nasal cannula oxygen  Post-op Assessment: Report given to RN and Post -op Vital signs reviewed and stable  Post vital signs: Reviewed and stable  Last Vitals:  Filed Vitals:   03/20/15 1020  BP: 137/98  Pulse: 96  Temp: 36.5 C  Resp: 20    Complications: No apparent anesthesia complications

## 2015-03-20 NOTE — CV Procedure (Signed)
Direct current cardioversion:  Indication symptomatic A. Fibrillation.  Procedure: Using 100 mg of IV Propofol and 60 IV Lidocaine (for reducing venous pain) for achieving deep sedation, synchronized direct current cardioversion performed. Patient was delivered with 120, 150 x 2 Joules of electricity  with success to NSR. Patient tolerated the procedure well. No immediate complication noted. Patient initially converted to atrial tachycardia then needed 3rd shock for successful cardioversion

## 2015-03-20 NOTE — Anesthesia Preprocedure Evaluation (Addendum)
Anesthesia Evaluation  Patient identified by MRN, date of birth, ID band Patient awake    Reviewed: Allergy & Precautions, NPO status , Patient's Chart, lab work & pertinent test results  History of Anesthesia Complications Negative for: history of anesthetic complications  Airway Mallampati: I  TM Distance: >3 FB Neck ROM: Full    Dental  (+) Teeth Intact   Pulmonary neg shortness of breath, neg sleep apnea, neg COPDneg recent URI, former smoker, neg PE breath sounds clear to auscultation        Cardiovascular hypertension, Pt. on medications + dysrhythmias Atrial Fibrillation Rhythm:Irregular     Neuro/Psych negative neurological ROS  negative psych ROS   GI/Hepatic negative GI ROS, Neg liver ROS,   Endo/Other  Morbid obesity  Renal/GU negative Renal ROS     Musculoskeletal   Abdominal   Peds  Hematology negative hematology ROS (+)   Anesthesia Other Findings   Reproductive/Obstetrics                           Anesthesia Physical Anesthesia Plan  ASA: III  Anesthesia Plan: MAC   Post-op Pain Management:    Induction: Intravenous  Airway Management Planned: Mask  Additional Equipment: None  Intra-op Plan:   Post-operative Plan:   Informed Consent: I have reviewed the patients History and Physical, chart, labs and discussed the procedure including the risks, benefits and alternatives for the proposed anesthesia with the patient or authorized representative who has indicated his/her understanding and acceptance.   Dental advisory given  Plan Discussed with: CRNA and Anesthesiologist  Anesthesia Plan Comments:        Anesthesia Quick Evaluation

## 2015-03-20 NOTE — Interval H&P Note (Signed)
History and Physical Interval Note:  03/20/2015 11:52 AM  Edward Watson  has presented today for surgery, with the diagnosis of afib  The various methods of treatment have been discussed with the patient and family. After consideration of risks, benefits and other options for treatment, the patient has consented to  Procedure(s): CARDIOVERSION (N/A) as a surgical intervention .  The patient's history has been reviewed, patient examined, no change in status, stable for surgery.  I have reviewed the patient's chart and labs.  Questions were answered to the patient's satisfaction.     [name]

## 2015-03-20 NOTE — Anesthesia Procedure Notes (Signed)
Procedure Name: MAC Date/Time: 03/20/2015 11:58 AM Performed by: Kyung Rudd Pre-anesthesia Checklist: Patient identified, Emergency Drugs available, Suction available, Timeout performed and Patient being monitored Patient Re-evaluated:Patient Re-evaluated prior to inductionOxygen Delivery Method: Ambu bag Preoxygenation: Pre-oxygenation with 100% oxygen Intubation Type: IV induction Ventilation: Mask ventilation without difficulty

## 2015-03-20 NOTE — Discharge Instructions (Signed)

## 2015-03-21 ENCOUNTER — Encounter (HOSPITAL_COMMUNITY): Payer: Self-pay | Admitting: Cardiology

## 2015-03-21 NOTE — Telephone Encounter (Signed)
I spoke to patient. He stated that he would like to proceed with CPAP treatment and would like to use [facility]. I will send request to them. Also will mail letter to remind him to make f/u appt.

## 2015-03-29 ENCOUNTER — Telehealth: Payer: Self-pay | Admitting: Neurology

## 2015-03-29 NOTE — Telephone Encounter (Signed)
Jonni Sanger from [facility] states that he has patient set up on CPAP with full face mask. He just wanted you to know that patient may have a high leak than normal due to the fitting of the mask.

## 2015-03-29 NOTE — Telephone Encounter (Signed)
Jonni Sanger from [place] call and requested to speak with the nurse regarding the patient. Please call and advise.

## 2015-04-25 ENCOUNTER — Encounter: Payer: Self-pay | Admitting: Family Medicine

## 2015-04-25 ENCOUNTER — Other Ambulatory Visit: Payer: Self-pay | Admitting: Family Medicine

## 2015-04-25 DIAGNOSIS — I4821 Permanent atrial fibrillation: Secondary | ICD-10-CM

## 2015-04-25 DIAGNOSIS — I4891 Unspecified atrial fibrillation: Secondary | ICD-10-CM | POA: Insufficient documentation

## 2015-04-25 HISTORY — DX: Permanent atrial fibrillation: I48.21

## 2015-04-28 ENCOUNTER — Encounter: Payer: Self-pay | Admitting: Neurology

## 2015-05-26 LAB — HEPATIC FUNCTION PANEL
ALT: 25 U/L (ref 10–40)
AST: 21 U/L (ref 14–40)
Alkaline Phosphatase: 70 U/L (ref 25–125)

## 2015-06-18 ENCOUNTER — Ambulatory Visit (INDEPENDENT_AMBULATORY_CARE_PROVIDER_SITE_OTHER): Payer: PPO | Admitting: Neurology

## 2015-06-18 ENCOUNTER — Encounter: Payer: Self-pay | Admitting: Neurology

## 2015-06-18 VITALS — BP 128/80 | HR 78 | Resp 18 | Ht 73.0 in | Wt 282.0 lb

## 2015-06-18 DIAGNOSIS — I481 Persistent atrial fibrillation: Secondary | ICD-10-CM | POA: Diagnosis not present

## 2015-06-18 DIAGNOSIS — E669 Obesity, unspecified: Secondary | ICD-10-CM

## 2015-06-18 DIAGNOSIS — I4819 Other persistent atrial fibrillation: Secondary | ICD-10-CM

## 2015-06-18 DIAGNOSIS — G4733 Obstructive sleep apnea (adult) (pediatric): Secondary | ICD-10-CM

## 2015-06-18 DIAGNOSIS — Z9989 Dependence on other enabling machines and devices: Principal | ICD-10-CM

## 2015-06-18 NOTE — Patient Instructions (Signed)
Please continue using your CPAP regularly. While your insurance requires that you use CPAP at least 4 hours each night on 70% of the nights, I recommend, that you not skip any nights and use it throughout the night if you can. Getting used to CPAP and staying with the treatment long term does take time and patience and discipline. Untreated obstructive sleep apnea when it is moderate to severe can have an adverse impact on cardiovascular health and raise her risk for heart disease, arrhythmias, hypertension, congestive heart failure, stroke and diabetes. Untreated obstructive sleep apnea causes sleep disruption, nonrestorative sleep, and sleep deprivation. This can have an impact on your day to day functioning and cause daytime sleepiness and impairment of cognitive function, memory loss, mood disturbance, and problems focussing. Using CPAP regularly can improve these symptoms.  We will increase your pressure to 17 cm.   Keep up the good work! I will see you back in 3-4 months for sleep apnea.
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
## 2015-07-04 ENCOUNTER — Telehealth: Payer: Self-pay

## 2015-07-04 DIAGNOSIS — I10 Essential (primary) hypertension: Secondary | ICD-10-CM

## 2015-07-04 MED ORDER — LOSARTAN POTASSIUM 50 MG PO TABS: 50.0000 mg | ORAL_TABLET | Freq: Every day | ORAL | Status: DC

## 2015-07-04 NOTE — Telephone Encounter (Signed)
Does pt need an office visit?

## 2015-07-04 NOTE — Telephone Encounter (Signed)
Cozaar refilled for 30 days. He will need to return for follow up for further refills.

## 2015-07-04 NOTE — Telephone Encounter (Signed)
Refill on Losartan. [facility]   [phone]

## 2015-07-05 NOTE — Telephone Encounter (Signed)
LMOM that this was sent in and that he is due for an OV

## 2015-07-06 ENCOUNTER — Other Ambulatory Visit: Payer: Self-pay

## 2015-07-06 NOTE — Telephone Encounter (Signed)
Pt is in need of as 90 day supply instead of a 30 day supply of Losartan. If unable to update please contact PT at [phone].   [phone]

## 2015-07-09 NOTE — Telephone Encounter (Signed)
Pharmacy called for 90 day supply. Per Rogelia Mire patient needs to be seen and she would only give 30 day supply till seen. Patient notified.

## 2015-07-19 ENCOUNTER — Other Ambulatory Visit: Payer: Self-pay | Admitting: Urgent Care

## 2015-07-19 ENCOUNTER — Encounter: Payer: Self-pay | Admitting: Urgent Care

## 2015-07-19 ENCOUNTER — Ambulatory Visit (INDEPENDENT_AMBULATORY_CARE_PROVIDER_SITE_OTHER): Payer: PPO | Admitting: Urgent Care

## 2015-07-19 VITALS — BP 125/72 | HR 74 | Temp 98.1°F | Resp 16 | Ht 73.3 in | Wt 281.6 lb

## 2015-07-19 DIAGNOSIS — I482 Chronic atrial fibrillation, unspecified: Secondary | ICD-10-CM

## 2015-07-19 DIAGNOSIS — I499 Cardiac arrhythmia, unspecified: Secondary | ICD-10-CM | POA: Diagnosis not present

## 2015-07-19 DIAGNOSIS — I10 Essential (primary) hypertension: Secondary | ICD-10-CM

## 2015-07-19 LAB — BASIC METABOLIC PANEL
BUN: 13 mg/dL (ref 7–25)
CALCIUM: 9.6 mg/dL (ref 8.6–10.3)
CO2: 27 mmol/L (ref 20–31)
CREATININE: 0.71 mg/dL (ref 0.70–1.25)
Chloride: 104 mmol/L (ref 98–110)
Glucose, Bld: 88 mg/dL (ref 65–99)
Potassium: 4.6 mmol/L (ref 3.5–5.3)
SODIUM: 140 mmol/L (ref 135–146)

## 2015-07-19 MED ORDER — LOSARTAN POTASSIUM 50 MG PO TABS: 50.0000 mg | ORAL_TABLET | Freq: Every day | ORAL | Status: DC

## 2015-07-19 NOTE — Patient Instructions (Signed)

## 2015-07-19 NOTE — Progress Notes (Signed)
    MRN: [id number] DOB: Sep 09, 1946  Subjective:   Edward Watson is a 69 y.o. male presenting for follow up on Hypertension, needs refill of losartan.   Currently managed with losartan, verapimil. Patient is checking blood pressure at home, range is 188'C systolic. Denies lightheadedness, dizziness, chronic headache, double vision, chest pain, heart racing, palpitations, nausea, vomiting, abdominal pain, hematuria, lower leg swelling. Smokes 1/2ppd, has cut down and is actively trying quit. Tries to eat healthy but does have hamburger or hot dogs. Patient is treated by cardiologist, Dr. Einar Gip, last visit was 05/2015. He is managing atrial fibrillation with Xarelto, previously failed cardioversion. Denies any other aggravating or relieving factors, no other questions or concerns.  [name] has a current medication list which includes the following prescription(s): aspirin, losartan, multivitamin, niacin, fish oil, verapamil, vitamin c, xarelto, and flecainide. Also has No Known Allergies.  Sundeep  has a past medical history of Hypertension; Hyperlipidemia; Dyslipidemia; Obesity; Erectile dysfunction; and Atrial fibrillation. Also  has past surgical history that includes Vasectomy; Tonsillectomy; and Cardioversion (N/A, 03/20/2015).  Objective:   Vitals: BP 125/72 mmHg  Pulse 74  Temp(Src) 98.1 F (36.7 C) (Oral)  Resp 16  Ht 6' 1.3" (1.862 m)  Wt 281 lb 9.6 oz (127.733 kg)  BMI 36.84 kg/m2  Physical Exam  Constitutional: He is oriented to person, place, and time. He appears well-developed and well-nourished.  HENT:  Mouth/Throat: Oropharynx is clear and moist.  Eyes: No scleral icterus.  Neck: Normal range of motion. Neck supple. No thyromegaly present.  Cardiovascular: Normal rate and intact distal pulses.  Exam reveals no gallop and no friction rub.   No murmur heard. Irregularly irregular rhythm.  Pulmonary/Chest: No respiratory distress. He has no wheezes. He has no rales.    Abdominal: Soft. Bowel sounds are normal. He exhibits no distension and no mass. There is no tenderness.  Neurological: He is alert and oriented to person, place, and time.  Skin: Skin is warm and dry. No rash noted. No erythema. No pallor.   Assessment and Plan :   1. Essential hypertension - Well controlled, labs pending. Refilled losartan. - Continue efforts to quit smoking, practice healthy diet. - Follow up in 6 months for HTN.  2. Irregular heartbeat 3. Chronic atrial fibrillation - Continue follow up with Dr. Einar Gip.  Jaynee Eagles, PA-C [facility] [phone] 07/19/2015 1:06 PM

## 2015-07-20 ENCOUNTER — Encounter: Payer: Self-pay | Admitting: Urgent Care

## 2015-10-02 ENCOUNTER — Telehealth: Payer: Self-pay

## 2015-10-04 NOTE — Telephone Encounter (Signed)
Patient is calling to request a 90 refill for losartan sent to [facility].

## 2015-10-04 NOTE — Telephone Encounter (Signed)
This was done on 9/1

## 2015-10-08 NOTE — Telephone Encounter (Signed)
Spoke with pt, the pharmacy stated he did have the refills.

## 2015-10-15 ENCOUNTER — Telehealth: Payer: Self-pay

## 2015-10-15 DIAGNOSIS — E785 Hyperlipidemia, unspecified: Secondary | ICD-10-CM

## 2015-10-15 MED ORDER — NIACIN ER (ANTIHYPERLIPIDEMIC) 1000 MG PO TBCR: EXTENDED_RELEASE_TABLET | ORAL | Status: DC

## 2015-10-15 NOTE — Telephone Encounter (Signed)
Pt is requesting a refill of: niacin (NIASPAN) 1000 MG CR tablet [id number]. Pt was just seen for a yearly check up so he would like enough refills to hold him until his next check up.  PHARMACY: [facility] - [place]

## 2015-10-15 NOTE — Telephone Encounter (Signed)
Rx sent 

## 2015-10-31 ENCOUNTER — Encounter: Payer: Self-pay | Admitting: Family Medicine

## 2015-10-31 ENCOUNTER — Ambulatory Visit (INDEPENDENT_AMBULATORY_CARE_PROVIDER_SITE_OTHER): Payer: PPO | Admitting: Family Medicine

## 2015-10-31 VITALS — BP 123/72 | HR 89 | Temp 97.8°F | Resp 16 | Ht 73.0 in | Wt 282.4 lb

## 2015-10-31 DIAGNOSIS — Z125 Encounter for screening for malignant neoplasm of prostate: Secondary | ICD-10-CM | POA: Diagnosis not present

## 2015-10-31 DIAGNOSIS — E785 Hyperlipidemia, unspecified: Secondary | ICD-10-CM

## 2015-10-31 DIAGNOSIS — Z130 Encounter for screening for diseases of the blood and blood-forming organs and certain disorders involving the immune mechanism: Secondary | ICD-10-CM | POA: Diagnosis not present

## 2015-10-31 DIAGNOSIS — I482 Chronic atrial fibrillation, unspecified: Secondary | ICD-10-CM

## 2015-10-31 DIAGNOSIS — Z7901 Long term (current) use of anticoagulants: Secondary | ICD-10-CM

## 2015-10-31 DIAGNOSIS — R972 Elevated prostate specific antigen [PSA]: Secondary | ICD-10-CM

## 2015-10-31 DIAGNOSIS — Z23 Encounter for immunization: Secondary | ICD-10-CM

## 2015-10-31 DIAGNOSIS — R7309 Other abnormal glucose: Secondary | ICD-10-CM

## 2015-10-31 DIAGNOSIS — Z0000 Encounter for general adult medical examination without abnormal findings: Secondary | ICD-10-CM

## 2015-10-31 DIAGNOSIS — Z131 Encounter for screening for diabetes mellitus: Secondary | ICD-10-CM

## 2015-10-31 DIAGNOSIS — F17200 Nicotine dependence, unspecified, uncomplicated: Secondary | ICD-10-CM

## 2015-10-31 LAB — COMPREHENSIVE METABOLIC PANEL
ALBUMIN: 4.3 g/dL (ref 3.6–5.1)
ALK PHOS: 75 U/L (ref 40–115)
ALT: 23 U/L (ref 9–46)
AST: 20 U/L (ref 10–35)
BUN: 13 mg/dL (ref 7–25)
CALCIUM: 9.1 mg/dL (ref 8.6–10.3)
CO2: 26 mmol/L (ref 20–31)
CREATININE: 0.76 mg/dL (ref 0.70–1.25)
Chloride: 105 mmol/L (ref 98–110)
Glucose, Bld: 109 mg/dL — ABNORMAL HIGH (ref 65–99)
Potassium: 4.2 mmol/L (ref 3.5–5.3)
SODIUM: 141 mmol/L (ref 135–146)
TOTAL PROTEIN: 6.6 g/dL (ref 6.1–8.1)
Total Bilirubin: 1 mg/dL (ref 0.2–1.2)

## 2015-10-31 LAB — LIPID PANEL
Cholesterol: 107 mg/dL — ABNORMAL LOW (ref 125–200)
HDL: 30 mg/dL — ABNORMAL LOW (ref >=40)
LDL Cholesterol: 52 mg/dL (ref <130)
Total CHOL/HDL Ratio: 3.6 Ratio (ref <=5.0)
Triglycerides: 123 mg/dL (ref <150)
VLDL: 25 mg/dL (ref <30)

## 2015-10-31 LAB — CBC
HEMATOCRIT: 40.8 % (ref 39.0–52.0)
Hemoglobin: 13.6 g/dL (ref 13.0–17.0)
MCH: 30.1 pg (ref 26.0–34.0)
MCHC: 33.3 g/dL (ref 30.0–36.0)
MCV: 90.3 fL (ref 78.0–100.0)
MPV: 9 fL (ref 8.6–12.4)
Platelets: 187 10*3/uL (ref 150–400)
RBC: 4.52 MIL/uL (ref 4.22–5.81)
RDW: 13.9 % (ref 11.5–15.5)
WBC: 6.8 10*3/uL (ref 4.0–10.5)

## 2015-10-31 MED ORDER — VARENICLINE TARTRATE 1 MG PO TABS: 1.0000 mg | ORAL_TABLET | Freq: Two times a day (BID) | ORAL | Status: DC

## 2015-10-31 MED ORDER — VARENICLINE TARTRATE 0.5 MG X 11 & 1 MG X 42 PO MISC: ORAL | Status: DC

## 2015-10-31 NOTE — Progress Notes (Signed)
[facility] [address] [place] [address] [phone]  Date:  10/31/2015   Name:  Edward Watson   DOB:  Jun 16, 1946   MRN:  [id number]  PCP:  Ellsworth Lennox, MD    Chief Complaint: Annual Exam   History of Present Illness:  Edward Watson is a 69 y.o. very pleasant male patient who presents with the following:  History of a fib, ED, obesity, smoking, HTN and dyslipidemia, OSA.  He was cardioverted this spring but it did not take- he and his cardiologist Dr. Einar Gip opted for rate control and he is on xarelto.    Colon is UTD Needs 2nd pneumonia vaccine (prevnar) tdap and zostavax are UTD  He is not sure if he is in a fib all the time or not.  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   No issues with ADLs.   He is fasting today for labs   He delines a flu shot He would like to try chantix- he used this in the past and was able to quit tobacco but started back.  Discussed how to use chantix  He exercises 45- 60 minutes a day by walking with his dog.  He has a good family support- his son and grandchildren are all near, they have christmas plans together   Patient Active Problem List   Diagnosis Date Noted  . Atrial fibrillation ([place]) 04/25/2015  . Erectile dysfunction 03/22/2014  . Tobacco user 06/22/2013  . Obesity, Class II, BMI 35-39.9, with comorbidity ([place]) 05/19/2012  . HTN (hypertension) 05/18/2012  . Dyslipidemia 05/18/2012    Past Medical History  Diagnosis Date  . Hypertension   . Hyperlipidemia   . Dyslipidemia   . Obesity   . Erectile dysfunction   . Atrial fibrillation [facility])     Past Surgical History  Procedure Laterality Date  . Vasectomy    . Tonsillectomy      age 3  . Cardioversion N/A 03/20/2015    Procedure: CARDIOVERSION;  Surgeon: [name], MD;  Location: [facility] ENDOSCOPY;  Service: Cardiovascular;  Laterality: N/A;    Social History  Substance Use Topics  . Smoking status:  Former Smoker -- 1.00 packs/day for 50 years    Quit date: 08/26/2013  . Smokeless tobacco: Never Used     Comment: 0 cigarettes for 3 weeks  . Alcohol Use: 0.6 oz/week    1 Standard drinks or equivalent per week     Comment: occas. beer or two a month    Family History  Problem Relation Age of Onset  . Cancer Mother     kind unknown  . Colon cancer Paternal Grandmother     No Known Allergies  Medication list has been reviewed and updated.  Current Outpatient Prescriptions on File Prior to Visit  Medication Sig Dispense Refill  . aspirin 81 MG tablet Take 81 mg by mouth daily.    Marland Kitchen atorvastatin (LIPITOR) 40 MG tablet Take 40 mg by mouth daily.    Marland Kitchen losartan (COZAAR) 50 MG tablet Take 1 tablet (50 mg total) by mouth daily. 90 tablet 3  . Multiple Vitamin (MULTIVITAMIN) tablet Take 1 tablet by mouth daily.    . niacin (NIASPAN) 1000 MG CR tablet Take 1 pill once daily as directed with a low fat snack and aspirin 30 minutes prior to taking medication 100 tablet 2  . Omega-3 Fatty Acids (FISH OIL) 1000 MG CAPS Take 2,000  mg by mouth 2 (two) times daily.     . verapamil (VERELAN PM) 240 MG 24 hr capsule Take 240 mg by mouth at bedtime.    . vitamin C (ASCORBIC ACID) 500 MG tablet Take 500 mg by mouth daily.    Alveda Reasons 20 MG TABS tablet TK 1 T PO QPM AFTER DINNER  0   No current facility-administered medications on file prior to visit.    Review of Systems:  As per HPI- otherwise negative.   Physical Examination: Filed Vitals:   10/31/15 0817  BP: 123/72  Pulse: 89  Temp: 97.8 F (36.6 C)  Resp: 16   Filed Vitals:   10/31/15 0817  Height: 6\' 1"  (1.854 m)  Weight: 282 lb 6.4 oz (128.096 kg)   Body mass index is 37.27 kg/(m^2). Ideal Body Weight: Weight in (lb) to have BMI = 25: 189.1  GEN: WDWN, NAD, Non-toxic, A & O x 3, obese, looks well HEENT: Atraumatic, Normocephalic. Neck supple. No masses, No LAD.  Bilateral TM wnl, oropharynx normal.  PEERL,EOMI.   Ears  and Nose: No external deformity. CV: a fib but rate is ok, No M/G/R. No JVD. No thrill. No extra heart sounds. PULM: CTA B, no wheezes, crackles, rhonchi. No retractions. No resp. distress. No accessory muscle use. ABD: S, NT, ND. No rebound. No HSM. EXTR: No c/c/e NEURO Normal gait.  PSYCH: Normally interactive. Conversant. Not depressed or anxious appearing.  Calm demeanor.  Normal genital and DRE exam.  No masses  Assessment and Plan: Physical exam  Screening for deficiency anemia - Plan: CBC  Screening for diabetes mellitus - Plan: Comprehensive metabolic panel  Chronic atrial fibrillation ([place]) - Plan: Comprehensive metabolic panel  Chronic anticoagulation - Plan: CBC, Comprehensive metabolic panel  Dyslipidemia - Plan: Lipid panel  Tobacco use disorder - Plan: varenicline (CHANTIX CONTINUING MONTH PAK) 1 MG tablet, varenicline (CHANTIX STARTING MONTH PAK) 0.5 MG X 11 & 1 MG X 42 tablet  Immunization due - Plan: Pneumococcal conjugate vaccine 13-valent IM  Screening for prostate cancer - Plan: PSA, Medicare  Here today for a CPE He is on rate control for his a fib and doing well Labs pending as above chantix for tobacco cessation Declines a flu  BP controlled He is seeing his neurologist and cardiologist in January   Signed Lamar Blinks, MD

## 2015-10-31 NOTE — Patient Instructions (Signed)
It was great to see you today- have a wonderful Christmas!  Start the chantix starter pack a week prior to your desired quit date.  You can then take the regular pack for 3-4 months as you think necessary  I will be in touch with your labs asap Keep exercising and taking care of yourself- good luck with quitting smoking!

## 2015-11-01 ENCOUNTER — Encounter: Payer: Self-pay | Admitting: Family Medicine

## 2015-11-01 LAB — PSA, MEDICARE: PSA: 1.25 ng/mL (ref <=4.00)

## 2015-11-01 NOTE — Addendum Note (Signed)
Addended by: Lamar Blinks C on: 11/01/2015 03:22 PM   Modules accepted: Orders

## 2015-11-27 ENCOUNTER — Ambulatory Visit: Payer: Self-pay | Admitting: Neurology

## 2015-11-29 ENCOUNTER — Other Ambulatory Visit: Payer: Self-pay | Admitting: Family Medicine

## 2015-12-04 ENCOUNTER — Encounter: Payer: Self-pay | Admitting: Neurology

## 2015-12-04 ENCOUNTER — Ambulatory Visit (INDEPENDENT_AMBULATORY_CARE_PROVIDER_SITE_OTHER): Payer: PPO | Admitting: Neurology

## 2015-12-04 VITALS — BP 126/72 | HR 78 | Resp 22 | Ht 73.0 in | Wt 288.0 lb

## 2015-12-04 DIAGNOSIS — M25472 Effusion, left ankle: Secondary | ICD-10-CM

## 2015-12-04 DIAGNOSIS — Z9989 Dependence on other enabling machines and devices: Principal | ICD-10-CM

## 2015-12-04 DIAGNOSIS — I481 Persistent atrial fibrillation: Secondary | ICD-10-CM | POA: Diagnosis not present

## 2015-12-04 DIAGNOSIS — M25471 Effusion, right ankle: Secondary | ICD-10-CM

## 2015-12-04 DIAGNOSIS — G4733 Obstructive sleep apnea (adult) (pediatric): Secondary | ICD-10-CM | POA: Diagnosis not present

## 2015-12-04 DIAGNOSIS — E669 Obesity, unspecified: Secondary | ICD-10-CM | POA: Diagnosis not present

## 2015-12-04 DIAGNOSIS — I4819 Other persistent atrial fibrillation: Secondary | ICD-10-CM

## 2015-12-04 DIAGNOSIS — I482 Chronic atrial fibrillation: Secondary | ICD-10-CM | POA: Diagnosis not present

## 2015-12-04 DIAGNOSIS — E7800 Pure hypercholesterolemia, unspecified: Secondary | ICD-10-CM | POA: Diagnosis not present

## 2015-12-04 DIAGNOSIS — Z7901 Long term (current) use of anticoagulants: Secondary | ICD-10-CM | POA: Diagnosis not present

## 2015-12-04 NOTE — Patient Instructions (Signed)
Please continue using your CPAP regularly. While your insurance requires that you use CPAP at least 4 hours each night on 70% of the nights, I recommend, that you not skip any nights and use it throughout the night if you can. Getting used to CPAP and staying with the treatment long term does take time and patience and discipline. Untreated obstructive sleep apnea when it is moderate to severe can have an adverse impact on cardiovascular health and raise her risk for heart disease, arrhythmias, hypertension, congestive heart failure, stroke and diabetes. Untreated obstructive sleep apnea causes sleep disruption, nonrestorative sleep, and sleep deprivation. This can have an impact on your day to day functioning and cause daytime sleepiness and impairment of cognitive function, memory loss, mood disturbance, and problems focussing. Using CPAP regularly can improve these symptoms.  Keep up the good work! I will see you back in 12 months for sleep apnea check up, and we will increase your pressure to 18 cm at this time.   Please try to lose weight, increase your water intake and decrease your sweet tea intake.
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
## 2015-12-07 ENCOUNTER — Encounter: Payer: Self-pay | Admitting: Family Medicine

## 2015-12-12 ENCOUNTER — Encounter: Payer: Self-pay | Admitting: Family Medicine

## 2015-12-26 ENCOUNTER — Telehealth: Payer: Self-pay

## 2015-12-26 MED ORDER — ATORVASTATIN CALCIUM 40 MG PO TABS: 40.0000 mg | ORAL_TABLET | Freq: Every day | ORAL | Status: DC

## 2015-12-26 NOTE — Telephone Encounter (Signed)
Pt is needing a refill on atorvastatin and one other, but did not see it on his med list  Best number [phone]

## 2015-12-26 NOTE — Telephone Encounter (Signed)
Called pt to see what else is needed and he also needs verapamil. Pt reported that his cardiologist last wrote it for 240 mg, but we have only ever Rxd for 180 mg. I advised pt that it would be best, since he has seen his cardiologist recently, to have this med managed by them. Pt agreed to call card office, but asked that I send in RF of the atorvastatin, which I did.

## 2015-12-30 DIAGNOSIS — G4733 Obstructive sleep apnea (adult) (pediatric): Secondary | ICD-10-CM | POA: Diagnosis not present

## 2015-12-31 ENCOUNTER — Telehealth: Payer: Self-pay

## 2015-12-31 NOTE — Telephone Encounter (Signed)
Pt states he have Vertigo and would like to have something called in for it. Please call pt at [facility]

## 2016-01-01 NOTE — Telephone Encounter (Signed)
For you [facility]

## 2016-01-01 NOTE — Telephone Encounter (Signed)
Left VM informing pt he needs to be evaluated. Said we cannot prescribe a medication for vertigo without knowing the cause. Does not look like pt has been seen for vertigo in the past.   If this is incorrect information just let me know and I can call pt back.

## 2016-01-01 NOTE — Telephone Encounter (Signed)
This is absolutely correct. Great job, [employer]!

## 2016-01-02 ENCOUNTER — Other Ambulatory Visit (INDEPENDENT_AMBULATORY_CARE_PROVIDER_SITE_OTHER): Payer: PPO | Admitting: *Deleted

## 2016-01-02 ENCOUNTER — Ambulatory Visit: Payer: PPO | Admitting: Family Medicine

## 2016-01-02 ENCOUNTER — Telehealth: Payer: Self-pay | Admitting: Family Medicine

## 2016-01-02 ENCOUNTER — Telehealth: Payer: Self-pay

## 2016-01-02 DIAGNOSIS — R7309 Other abnormal glucose: Secondary | ICD-10-CM | POA: Diagnosis not present

## 2016-01-02 DIAGNOSIS — R972 Elevated prostate specific antigen [PSA]: Secondary | ICD-10-CM

## 2016-01-02 LAB — HEMOGLOBIN A1C
Hgb A1c MFr Bld: 5.8 % — ABNORMAL HIGH (ref <5.7)
Mean Plasma Glucose: 120 mg/dL — ABNORMAL HIGH (ref <117)

## 2016-01-02 NOTE — Telephone Encounter (Signed)
Patient did not receive message of rescheduling appointment; presenting for office visit yet no scheduled appointment. Patient requesting labs be drawn since fasting.  Labs ordered by Dr. Lorelei Pont.

## 2016-01-02 NOTE — Telephone Encounter (Signed)
Patient needs a refill on vertigo medication.   [phone]

## 2016-01-03 ENCOUNTER — Encounter: Payer: Self-pay | Admitting: Family Medicine

## 2016-01-03 DIAGNOSIS — R7303 Prediabetes: Secondary | ICD-10-CM | POA: Insufficient documentation

## 2016-01-03 LAB — PSA, MEDICARE: PSA: 0.38 ng/mL (ref <=4.00)

## 2016-01-04 NOTE — Telephone Encounter (Signed)
What medication? Please get details of Sxs and send to provider, or advise pt to RTC if appropriate.

## 2016-01-07 ENCOUNTER — Telehealth: Payer: Self-pay

## 2016-01-07 NOTE — Telephone Encounter (Signed)
Pt is returning our call from earlier  Best number [phone]

## 2016-01-08 NOTE — Telephone Encounter (Signed)
PATIENT WOULD LIKE A REFILL ON MECLIZINE.  HE SAYS THAT HE HAS BEEN PRESCRIBED IT IN THE PAST FOR VERTIGO.  I COULD NOT FIND IT ANYWHERE IN HIS CHART.  HE WAS HERE IN December FOR A CPE WITH DR COPLAND AND STATED THAT HE MENTIONED IT TO HER BUT I DID NOT SEE IT.  PLEASE ADVISE/

## 2016-01-10 NOTE — Telephone Encounter (Signed)
Spoke with Pt. And he said he would call to set up an appointment

## 2016-01-10 NOTE — Telephone Encounter (Signed)
Please call --- we do not have any record of a meclizine rx in the past year; there is also no documentation of vertigo in chart from CPE with Dr. Lorelei Pont.  Thus, we are unable to prescribe Meclizine without evaluation.

## 2016-01-27 DIAGNOSIS — G4733 Obstructive sleep apnea (adult) (pediatric): Secondary | ICD-10-CM | POA: Diagnosis not present

## 2016-02-27 DIAGNOSIS — G4733 Obstructive sleep apnea (adult) (pediatric): Secondary | ICD-10-CM | POA: Diagnosis not present

## 2016-03-28 DIAGNOSIS — G4733 Obstructive sleep apnea (adult) (pediatric): Secondary | ICD-10-CM | POA: Diagnosis not present

## 2016-04-28 DIAGNOSIS — G4733 Obstructive sleep apnea (adult) (pediatric): Secondary | ICD-10-CM | POA: Diagnosis not present

## 2016-05-28 DIAGNOSIS — G4733 Obstructive sleep apnea (adult) (pediatric): Secondary | ICD-10-CM | POA: Diagnosis not present

## 2016-06-02 DIAGNOSIS — I482 Chronic atrial fibrillation: Secondary | ICD-10-CM | POA: Diagnosis not present

## 2016-06-02 DIAGNOSIS — E7800 Pure hypercholesterolemia, unspecified: Secondary | ICD-10-CM | POA: Diagnosis not present

## 2016-06-02 DIAGNOSIS — I10 Essential (primary) hypertension: Secondary | ICD-10-CM | POA: Diagnosis not present

## 2016-06-06 DIAGNOSIS — G4733 Obstructive sleep apnea (adult) (pediatric): Secondary | ICD-10-CM | POA: Diagnosis not present

## 2016-06-06 DIAGNOSIS — E7800 Pure hypercholesterolemia, unspecified: Secondary | ICD-10-CM | POA: Diagnosis not present

## 2016-06-06 DIAGNOSIS — I482 Chronic atrial fibrillation: Secondary | ICD-10-CM | POA: Diagnosis not present

## 2016-06-06 DIAGNOSIS — Z7901 Long term (current) use of anticoagulants: Secondary | ICD-10-CM | POA: Diagnosis not present

## 2016-06-25 ENCOUNTER — Other Ambulatory Visit: Payer: Self-pay | Admitting: Family Medicine

## 2016-06-26 ENCOUNTER — Other Ambulatory Visit: Payer: Self-pay

## 2016-06-26 DIAGNOSIS — I10 Essential (primary) hypertension: Secondary | ICD-10-CM

## 2016-06-26 DIAGNOSIS — E785 Hyperlipidemia, unspecified: Secondary | ICD-10-CM

## 2016-06-26 MED ORDER — LOSARTAN POTASSIUM 50 MG PO TABS: 50.0000 mg | ORAL_TABLET | Freq: Every day | ORAL | 0 refills | Status: DC

## 2016-06-26 MED ORDER — NIACIN ER (ANTIHYPERLIPIDEMIC) 1000 MG PO TBCR: EXTENDED_RELEASE_TABLET | ORAL | 0 refills | Status: DC

## 2016-06-28 DIAGNOSIS — G4733 Obstructive sleep apnea (adult) (pediatric): Secondary | ICD-10-CM | POA: Diagnosis not present

## 2016-07-10 ENCOUNTER — Encounter: Payer: Self-pay | Admitting: Family Medicine

## 2016-07-10 ENCOUNTER — Ambulatory Visit (INDEPENDENT_AMBULATORY_CARE_PROVIDER_SITE_OTHER): Payer: PPO | Admitting: Family Medicine

## 2016-07-10 VITALS — BP 118/78 | HR 68 | Temp 97.8°F | Resp 18 | Ht 73.0 in | Wt 280.2 lb

## 2016-07-10 DIAGNOSIS — R7303 Prediabetes: Secondary | ICD-10-CM | POA: Diagnosis not present

## 2016-07-10 DIAGNOSIS — E785 Hyperlipidemia, unspecified: Secondary | ICD-10-CM

## 2016-07-10 DIAGNOSIS — Z0000 Encounter for general adult medical examination without abnormal findings: Secondary | ICD-10-CM | POA: Diagnosis not present

## 2016-07-10 DIAGNOSIS — G4733 Obstructive sleep apnea (adult) (pediatric): Secondary | ICD-10-CM

## 2016-07-10 DIAGNOSIS — Z114 Encounter for screening for human immunodeficiency virus [HIV]: Secondary | ICD-10-CM | POA: Diagnosis not present

## 2016-07-10 DIAGNOSIS — Z9989 Dependence on other enabling machines and devices: Secondary | ICD-10-CM

## 2016-07-10 DIAGNOSIS — I10 Essential (primary) hypertension: Secondary | ICD-10-CM

## 2016-07-10 DIAGNOSIS — E663 Overweight: Secondary | ICD-10-CM

## 2016-07-10 NOTE — Patient Instructions (Addendum)
Follow up with your dermatologist for the area on your face. Let me know if they need a referral.   Call your eye care provider for appointment.  Keep up the good work on weight loss.   Return when you are well hydrated to attempt a blood draw again. Let them know you are here for labs only.   Follow-up in 6 months. Sooner if needed.   Keeping you healthy  Get these tests  Blood pressure- Have your blood pressure checked once a year by your healthcare provider.  Normal blood pressure is 120/80  Weight- Have your body mass index (BMI) calculated to screen for obesity.  BMI is a measure of body fat based on height and weight. You can also calculate your own BMI at [employer][email or web address].  Cholesterol- Have your cholesterol checked every year.  Diabetes- Have your blood sugar checked regularly if you have high blood pressure, high cholesterol, have a family history of diabetes or if you are overweight.  Screening for Colon Cancer- Colonoscopy starting at age 58.  Screening may begin sooner depending on your family history and other health conditions. Follow up colonoscopy as directed by your Gastroenterologist.  Screening for Prostate Cancer- Both blood work (PSA) and a rectal exam help screen for Prostate Cancer.  Screening begins at age 38 with African-American men and at age 49 with Caucasian men.  Screening may begin sooner depending on your family history.  Take these medicines  Aspirin- One aspirin daily can help prevent Heart disease and Stroke.  Flu shot- Every fall.  Tetanus- Every 10 years.  Zostavax- Once after the age of 82 to prevent Shingles.  Pneumonia shot- Once after the age of 65; if you are younger than 49, ask your healthcare provider if you need a Pneumonia shot.  Take these steps  Don't smoke- If you do smoke, talk to your doctor about quitting.  For tips on how to quit, go to www.smokefree.gov or call 1-800-QUIT-NOW.  Be physically active- Exercise 5 days  a week for at least 30 minutes.  If you are not already physically active start slow and gradually work up to 30 minutes of moderate physical activity.  Examples of moderate activity include walking briskly, mowing the yard, dancing, swimming, bicycling, etc.  Eat a healthy diet- Eat a variety of healthy food such as fruits, vegetables, low fat milk, low fat cheese, yogurt, lean meant, poultry, fish, beans, tofu, etc. For more information go to www.thenutritionsource.org  Drink alcohol in moderation- Limit alcohol intake to less than two drinks a day. Never drink and drive.  Dentist- Brush and floss twice daily; visit your dentist twice a year.  Depression- Your emotional health is as important as your physical health. If you're feeling down, or losing interest in things you would normally enjoy please talk to your healthcare provider.  Eye exam- Visit your eye doctor every year.  Safe sex- If you may be exposed to a sexually transmitted infection, use a condom.  Seat belts- Seat belts can save your life; always wear one.  Smoke/Carbon Monoxide detectors- These detectors need to be installed on the appropriate level of your home.  Replace batteries at least once a year.  Skin cancer- When out in the sun, cover up and use sunscreen 15 SPF or higher.  Violence- If anyone is threatening you, please tell your healthcare provider.  Living Will/ Health care power of attorney- Speak with your healthcare provider and family.    IF you received an x-ray  today, you will receive an invoice from [facility]. Please contact [facility] at [phone] with questions or concerns regarding your invoice.   IF you received labwork today, you will receive an invoice from [employer]. Please contact [facility] at [phone] with questions or concerns regarding your invoice.   Our billing staff will not be able to assist you with questions regarding bills from  these companies.  You will be contacted with the lab results as soon as they are available. The fastest way to get your results is to activate your My Chart account. Instructions are located on the last page of this paperwork. If you have not heard from [place] regarding the results in 2 weeks, please contact this office.
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
## 2016-07-11 ENCOUNTER — Other Ambulatory Visit: Payer: Self-pay | Admitting: *Deleted

## 2016-07-11 ENCOUNTER — Other Ambulatory Visit: Payer: Self-pay

## 2016-07-11 DIAGNOSIS — E785 Hyperlipidemia, unspecified: Secondary | ICD-10-CM

## 2016-07-11 DIAGNOSIS — I10 Essential (primary) hypertension: Secondary | ICD-10-CM

## 2016-07-11 MED ORDER — LOSARTAN POTASSIUM 50 MG PO TABS: 50.0000 mg | ORAL_TABLET | Freq: Every day | ORAL | 1 refills | Status: DC

## 2016-07-11 MED ORDER — NIACIN ER (ANTIHYPERLIPIDEMIC) 1000 MG PO TBCR: EXTENDED_RELEASE_TABLET | ORAL | 1 refills | Status: DC

## 2016-07-29 DIAGNOSIS — G4733 Obstructive sleep apnea (adult) (pediatric): Secondary | ICD-10-CM | POA: Diagnosis not present

## 2016-08-14 DIAGNOSIS — H43812 Vitreous degeneration, left eye: Secondary | ICD-10-CM | POA: Diagnosis not present

## 2016-08-14 DIAGNOSIS — H4312 Vitreous hemorrhage, left eye: Secondary | ICD-10-CM | POA: Diagnosis not present

## 2016-08-21 DIAGNOSIS — H43812 Vitreous degeneration, left eye: Secondary | ICD-10-CM | POA: Diagnosis not present

## 2016-08-21 DIAGNOSIS — H4312 Vitreous hemorrhage, left eye: Secondary | ICD-10-CM | POA: Diagnosis not present

## 2016-08-28 DIAGNOSIS — G4733 Obstructive sleep apnea (adult) (pediatric): Secondary | ICD-10-CM | POA: Diagnosis not present

## 2016-09-03 DIAGNOSIS — H43812 Vitreous degeneration, left eye: Secondary | ICD-10-CM | POA: Diagnosis not present

## 2016-09-03 DIAGNOSIS — H4312 Vitreous hemorrhage, left eye: Secondary | ICD-10-CM | POA: Diagnosis not present

## 2016-09-18 DIAGNOSIS — I788 Other diseases of capillaries: Secondary | ICD-10-CM | POA: Diagnosis not present

## 2016-09-18 DIAGNOSIS — D225 Melanocytic nevi of trunk: Secondary | ICD-10-CM | POA: Diagnosis not present

## 2016-09-18 DIAGNOSIS — L570 Actinic keratosis: Secondary | ICD-10-CM | POA: Diagnosis not present

## 2016-09-18 DIAGNOSIS — L820 Inflamed seborrheic keratosis: Secondary | ICD-10-CM | POA: Diagnosis not present

## 2016-09-18 DIAGNOSIS — L821 Other seborrheic keratosis: Secondary | ICD-10-CM | POA: Diagnosis not present

## 2016-09-18 DIAGNOSIS — L812 Freckles: Secondary | ICD-10-CM | POA: Diagnosis not present

## 2016-09-19 ENCOUNTER — Other Ambulatory Visit: Payer: Self-pay | Admitting: Emergency Medicine

## 2016-09-19 ENCOUNTER — Other Ambulatory Visit: Payer: Self-pay | Admitting: Family Medicine

## 2016-09-19 MED ORDER — ATORVASTATIN CALCIUM 40 MG PO TABS: 40.0000 mg | ORAL_TABLET | Freq: Every day | ORAL | 0 refills | Status: DC

## 2016-09-24 DIAGNOSIS — H43812 Vitreous degeneration, left eye: Secondary | ICD-10-CM | POA: Diagnosis not present

## 2016-09-24 DIAGNOSIS — H2513 Age-related nuclear cataract, bilateral: Secondary | ICD-10-CM | POA: Diagnosis not present

## 2016-09-28 DIAGNOSIS — G4733 Obstructive sleep apnea (adult) (pediatric): Secondary | ICD-10-CM | POA: Diagnosis not present

## 2016-10-01 ENCOUNTER — Other Ambulatory Visit: Payer: Self-pay | Admitting: Family Medicine

## 2016-10-01 DIAGNOSIS — I10 Essential (primary) hypertension: Secondary | ICD-10-CM

## 2016-10-01 DIAGNOSIS — E785 Hyperlipidemia, unspecified: Secondary | ICD-10-CM

## 2016-10-01 MED ORDER — ATORVASTATIN CALCIUM 40 MG PO TABS: 40.0000 mg | ORAL_TABLET | Freq: Every day | ORAL | 0 refills | Status: DC

## 2016-10-01 NOTE — Telephone Encounter (Signed)
Pt called to advise that he talked with [facility] mail order and they have all of his Rxs except the atrovastatin which they need for [place] to send in. Sent the RF in for pt.

## 2016-10-28 DIAGNOSIS — G4733 Obstructive sleep apnea (adult) (pediatric): Secondary | ICD-10-CM | POA: Diagnosis not present

## 2016-12-02 ENCOUNTER — Telehealth: Payer: Self-pay

## 2016-12-02 ENCOUNTER — Telehealth: Payer: Self-pay | Admitting: Family Medicine

## 2016-12-02 NOTE — Telephone Encounter (Signed)
lmom that his appointment that he had with Carlota Raspberry on 01-01-17 has been cancelled and to reschedule for an OV

## 2016-12-02 NOTE — Telephone Encounter (Signed)
I spoke to patient and r/s his appt due to possible snow.

## 2016-12-03 ENCOUNTER — Ambulatory Visit: Payer: PPO | Admitting: Neurology

## 2016-12-08 ENCOUNTER — Encounter: Payer: Self-pay | Admitting: Neurology

## 2016-12-08 ENCOUNTER — Ambulatory Visit (INDEPENDENT_AMBULATORY_CARE_PROVIDER_SITE_OTHER): Payer: PPO | Admitting: Neurology

## 2016-12-08 VITALS — BP 136/66 | HR 82 | Resp 20 | Ht 73.0 in | Wt 288.0 lb

## 2016-12-08 DIAGNOSIS — Z9989 Dependence on other enabling machines and devices: Secondary | ICD-10-CM | POA: Diagnosis not present

## 2016-12-08 DIAGNOSIS — G4733 Obstructive sleep apnea (adult) (pediatric): Secondary | ICD-10-CM

## 2016-12-08 NOTE — Patient Instructions (Signed)
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
## 2016-12-09 DIAGNOSIS — R739 Hyperglycemia, unspecified: Secondary | ICD-10-CM | POA: Diagnosis not present

## 2016-12-09 DIAGNOSIS — I10 Essential (primary) hypertension: Secondary | ICD-10-CM | POA: Diagnosis not present

## 2016-12-17 DIAGNOSIS — Z7901 Long term (current) use of anticoagulants: Secondary | ICD-10-CM | POA: Diagnosis not present

## 2016-12-17 DIAGNOSIS — E7800 Pure hypercholesterolemia, unspecified: Secondary | ICD-10-CM | POA: Diagnosis not present

## 2016-12-17 DIAGNOSIS — G4733 Obstructive sleep apnea (adult) (pediatric): Secondary | ICD-10-CM | POA: Diagnosis not present

## 2016-12-17 DIAGNOSIS — I482 Chronic atrial fibrillation: Secondary | ICD-10-CM | POA: Diagnosis not present

## 2017-01-01 ENCOUNTER — Other Ambulatory Visit: Payer: Self-pay | Admitting: Family Medicine

## 2017-01-01 ENCOUNTER — Ambulatory Visit: Payer: PPO | Admitting: Family Medicine

## 2017-01-01 DIAGNOSIS — I10 Essential (primary) hypertension: Secondary | ICD-10-CM

## 2017-01-01 DIAGNOSIS — E785 Hyperlipidemia, unspecified: Secondary | ICD-10-CM

## 2017-01-08 ENCOUNTER — Encounter: Payer: Self-pay | Admitting: Family Medicine

## 2017-01-08 ENCOUNTER — Ambulatory Visit (INDEPENDENT_AMBULATORY_CARE_PROVIDER_SITE_OTHER): Payer: PPO | Admitting: Family Medicine

## 2017-01-08 VITALS — BP 118/74 | HR 84 | Temp 98.0°F | Resp 16 | Ht 73.0 in | Wt 288.0 lb

## 2017-01-08 DIAGNOSIS — I4891 Unspecified atrial fibrillation: Secondary | ICD-10-CM | POA: Diagnosis not present

## 2017-01-08 DIAGNOSIS — R7303 Prediabetes: Secondary | ICD-10-CM

## 2017-01-08 DIAGNOSIS — E785 Hyperlipidemia, unspecified: Secondary | ICD-10-CM

## 2017-01-08 DIAGNOSIS — I10 Essential (primary) hypertension: Secondary | ICD-10-CM | POA: Diagnosis not present

## 2017-01-08 MED ORDER — NIACIN ER (ANTIHYPERLIPIDEMIC) 1000 MG PO TBCR: EXTENDED_RELEASE_TABLET | ORAL | 2 refills | Status: DC

## 2017-01-08 MED ORDER — LOSARTAN POTASSIUM 50 MG PO TABS: 50.0000 mg | ORAL_TABLET | Freq: Every day | ORAL | 2 refills | Status: DC

## 2017-01-08 MED ORDER — ATORVASTATIN CALCIUM 40 MG PO TABS: 40.0000 mg | ORAL_TABLET | Freq: Every day | ORAL | 2 refills | Status: DC

## 2017-01-08 NOTE — Patient Instructions (Addendum)
  Continue exercise every day if possible. Continue to watch diet and portions to help with weight loss.  No change in meds for now.    IF you received an x-ray today, you will receive an invoice from [facility]. Please contact [facility] at [phone] with questions or concerns regarding your invoice.   IF you received labwork today, you will receive an invoice from [facility]. Please contact [facility] at [phone] with questions or concerns regarding your invoice.   Our billing staff will not be able to assist you with questions regarding bills from these companies.  You will be contacted with the lab results as soon as they are available. The fastest way to get your results is to activate your My Chart account. Instructions are located on the last page of this paperwork. If you have not heard from [place] regarding the results in 2 weeks, please contact this office.
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
## 2017-01-09 LAB — HEPATIC FUNCTION PANEL
ALK PHOS: 70 IU/L (ref 39–117)
ALT: 27 IU/L (ref 0–44)
AST: 23 IU/L (ref 0–40)
Albumin: 4.4 g/dL (ref 3.6–4.8)
BILIRUBIN TOTAL: 1 mg/dL (ref 0.0–1.2)
BILIRUBIN, DIRECT: 0.24 mg/dL (ref 0.00–0.40)
Total Protein: 6.6 g/dL (ref 6.0–8.5)

## 2017-01-09 LAB — LIPID PANEL
CHOL/HDL RATIO: 3.1 (ref 0.0–5.0)
CHOLESTEROL TOTAL: 107 mg/dL (ref 100–199)
HDL: 34 mg/dL — ABNORMAL LOW (ref >39)
LDL Calculated: 57 (ref 0–99)
TRIGLYCERIDES: 79 mg/dL (ref 0–149)
VLDL Cholesterol Cal: 16 (ref 5–40)

## 2017-03-12 ENCOUNTER — Other Ambulatory Visit: Payer: Self-pay | Admitting: Family Medicine

## 2017-03-12 DIAGNOSIS — I10 Essential (primary) hypertension: Secondary | ICD-10-CM

## 2017-03-12 DIAGNOSIS — E785 Hyperlipidemia, unspecified: Secondary | ICD-10-CM

## 2017-03-13 DIAGNOSIS — H2513 Age-related nuclear cataract, bilateral: Secondary | ICD-10-CM | POA: Diagnosis not present

## 2017-03-27 DIAGNOSIS — H524 Presbyopia: Secondary | ICD-10-CM | POA: Diagnosis not present

## 2017-03-27 DIAGNOSIS — H2513 Age-related nuclear cataract, bilateral: Secondary | ICD-10-CM | POA: Diagnosis not present

## 2017-03-27 DIAGNOSIS — H52203 Unspecified astigmatism, bilateral: Secondary | ICD-10-CM | POA: Diagnosis not present

## 2017-04-07 DIAGNOSIS — H2512 Age-related nuclear cataract, left eye: Secondary | ICD-10-CM | POA: Diagnosis not present

## 2017-04-07 DIAGNOSIS — H25812 Combined forms of age-related cataract, left eye: Secondary | ICD-10-CM | POA: Diagnosis not present

## 2017-05-05 DIAGNOSIS — H2511 Age-related nuclear cataract, right eye: Secondary | ICD-10-CM | POA: Diagnosis not present

## 2017-05-05 DIAGNOSIS — H25811 Combined forms of age-related cataract, right eye: Secondary | ICD-10-CM | POA: Diagnosis not present

## 2017-06-17 ENCOUNTER — Other Ambulatory Visit: Payer: Self-pay | Admitting: Family Medicine

## 2017-06-17 DIAGNOSIS — I10 Essential (primary) hypertension: Secondary | ICD-10-CM

## 2017-06-17 DIAGNOSIS — Z7901 Long term (current) use of anticoagulants: Secondary | ICD-10-CM | POA: Diagnosis not present

## 2017-06-17 DIAGNOSIS — E7800 Pure hypercholesterolemia, unspecified: Secondary | ICD-10-CM | POA: Diagnosis not present

## 2017-06-17 DIAGNOSIS — E785 Hyperlipidemia, unspecified: Secondary | ICD-10-CM

## 2017-06-17 DIAGNOSIS — G4733 Obstructive sleep apnea (adult) (pediatric): Secondary | ICD-10-CM | POA: Diagnosis not present

## 2017-06-17 DIAGNOSIS — I482 Chronic atrial fibrillation: Secondary | ICD-10-CM | POA: Diagnosis not present

## 2017-06-18 ENCOUNTER — Ambulatory Visit: Payer: PPO | Admitting: Family Medicine

## 2017-06-19 ENCOUNTER — Ambulatory Visit (INDEPENDENT_AMBULATORY_CARE_PROVIDER_SITE_OTHER): Payer: PPO | Admitting: Family Medicine

## 2017-06-19 ENCOUNTER — Encounter: Payer: Self-pay | Admitting: Family Medicine

## 2017-06-19 VITALS — BP 120/78 | HR 77 | Temp 97.7°F | Resp 18 | Ht 73.0 in | Wt 286.6 lb

## 2017-06-19 DIAGNOSIS — E7800 Pure hypercholesterolemia, unspecified: Secondary | ICD-10-CM | POA: Diagnosis not present

## 2017-06-19 DIAGNOSIS — E785 Hyperlipidemia, unspecified: Secondary | ICD-10-CM

## 2017-06-19 DIAGNOSIS — R7303 Prediabetes: Secondary | ICD-10-CM

## 2017-06-19 DIAGNOSIS — I482 Chronic atrial fibrillation: Secondary | ICD-10-CM | POA: Diagnosis not present

## 2017-06-19 DIAGNOSIS — I10 Essential (primary) hypertension: Secondary | ICD-10-CM

## 2017-06-19 DIAGNOSIS — I481 Persistent atrial fibrillation: Secondary | ICD-10-CM

## 2017-06-19 DIAGNOSIS — I4819 Other persistent atrial fibrillation: Secondary | ICD-10-CM

## 2017-06-19 MED ORDER — NIACIN ER (ANTIHYPERLIPIDEMIC) 1000 MG PO TBCR: EXTENDED_RELEASE_TABLET | ORAL | 1 refills | Status: DC

## 2017-06-19 MED ORDER — LOSARTAN POTASSIUM 50 MG PO TABS: 50.0000 mg | ORAL_TABLET | Freq: Every day | ORAL | 2 refills | Status: DC

## 2017-06-19 MED ORDER — ATORVASTATIN CALCIUM 40 MG PO TABS: 40.0000 mg | ORAL_TABLET | Freq: Every day | ORAL | 2 refills | Status: DC

## 2017-06-19 NOTE — Patient Instructions (Addendum)
I will keep an eye out for your blood tests from cardiology.   Continue to work on diet and exercise as cleared by cardiology to work on weight loss. That should help treat prediabetes.    Follow-up before the end of the year for physical as planned. Let me know if you have questions in the meantime.    IF you received an x-ray today, you will receive an invoice from [facility]. Please contact [facility] at [phone] with questions or concerns regarding your invoice.   IF you received labwork today, you will receive an invoice from [facility]. Please contact [facility] at [phone] with questions or concerns regarding your invoice.   Our billing staff will not be able to assist you with questions regarding bills from these companies.  You will be contacted with the lab results as soon as they are available. The fastest way to get your results is to activate your My Chart account. Instructions are located on the last page of this paperwork. If you have not heard from [place] regarding the results in 2 weeks, please contact this office.
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
## 2017-06-24 DIAGNOSIS — H1013 Acute atopic conjunctivitis, bilateral: Secondary | ICD-10-CM | POA: Diagnosis not present

## 2017-07-01 ENCOUNTER — Telehealth: Payer: Self-pay | Admitting: Family Medicine

## 2017-07-01 NOTE — Telephone Encounter (Signed)
PATIENT WOULD LIKE DR. GREENE TO CALL HIM IN 10 GENERIC VIAGRA PILLS. HE SAID HE SAW DR. Carlota Raspberry ABOUT 2 WEEKS AGO BUT NOT FOR THIS. HE IS NOT SURE WHO HAS PRESCRIBED THIS TO HIM  IN THE PAST. BEST PHONE [phone] (CELL) HE WOULD LIKE A PRESCRIPTION FAXED TO [facility] IN [place]. HE SAID IT IS CHEAPER THERE. THEIR PHONE # IS [phone]   FAX # IS: [phone]  [place]

## 2017-07-09 NOTE — Telephone Encounter (Signed)
Unfortunately the last time I see this medication addressed was in 2015 with Dr. Leward Quan. However in reviewing her notes it appears that Viagra was not working well at that time and had changed to Cialis. If Viagra was not working for him, then I'm concerned that generic Viagra would also not be effective. Typically would want to discuss this medication in the office, but I understand we just had a visit a few weeks ago so can call him to discuss further if he would like.

## 2017-07-09 NOTE — Telephone Encounter (Signed)
Please advise 

## 2017-07-13 DIAGNOSIS — Z961 Presence of intraocular lens: Secondary | ICD-10-CM | POA: Diagnosis not present

## 2017-07-13 NOTE — Telephone Encounter (Signed)
Please advise 

## 2017-07-15 NOTE — Telephone Encounter (Signed)
Pt states no one has follow up with him.  Did adv pt of note from dr. Carlota Raspberry, he states he will wait til his appt in Nov. To talk to Dr Carlota Raspberry about the medication.

## 2017-09-03 ENCOUNTER — Telehealth: Payer: Self-pay

## 2017-09-03 NOTE — Telephone Encounter (Signed)
Called pt to schedule Medicare Annual Wellness Visit prior to upcoming visit with PCP.    Josepha Pigg, B.A.  Care Guide [phone]

## 2017-09-14 ENCOUNTER — Ambulatory Visit (INDEPENDENT_AMBULATORY_CARE_PROVIDER_SITE_OTHER): Payer: PPO

## 2017-09-14 VITALS — BP 114/72 | HR 82 | Temp 98.2°F | Ht 73.0 in | Wt 288.0 lb

## 2017-09-14 DIAGNOSIS — R7303 Prediabetes: Secondary | ICD-10-CM | POA: Diagnosis not present

## 2017-09-14 DIAGNOSIS — Z0000 Encounter for general adult medical examination without abnormal findings: Secondary | ICD-10-CM

## 2017-09-14 DIAGNOSIS — I10 Essential (primary) hypertension: Secondary | ICD-10-CM

## 2017-09-14 DIAGNOSIS — Z125 Encounter for screening for malignant neoplasm of prostate: Secondary | ICD-10-CM | POA: Diagnosis not present

## 2017-09-14 NOTE — Progress Notes (Signed)
Subjective:   Edward Watson is a 71 y.o. male who presents for Medicare Annual/Subsequent preventive examination.  Review of Systems:  N/A Cardiac Risk Factors include: advanced age (>65men, >52 women);diabetes mellitus;dyslipidemia;obesity (BMI >30kg/m2);male gender     Objective:    Vitals: BP 114/72   Pulse 82   Temp 98.2 F (36.8 C) (Oral)   Ht 6\' 1"  (1.854 m)   Wt 288 lb (130.6 kg)   SpO2 95%   BMI 38.00 kg/m   Body mass index is 38 kg/m.  Tobacco History  Smoking Status  . Former Smoker  . Packs/day: 1.00  . Years: 50.00  . Quit date: 08/26/2013  Smokeless Tobacco  . Never Used    Comment: 0 cigarettes for 3 weeks     Counseling given: Not Answered   Past Medical History:  Diagnosis Date  . Atrial fibrillation ([place])   . Dyslipidemia   . Erectile dysfunction   . Hyperlipidemia   . Hypertension   . Obesity    Past Surgical History:  Procedure Laterality Date  . CARDIOVERSION N/A 03/20/2015   Procedure: CARDIOVERSION;  Surgeon: [name], MD;  Location: [facility] ENDOSCOPY;  Service: Cardiovascular;  Laterality: N/A;  . TONSILLECTOMY     age 35  . VASECTOMY     Family History  Problem Relation Age of Onset  . Colon cancer Paternal Grandmother   . Cancer Mother        kind unknown   History  Sexual Activity  . Sexual activity: Not on file    Outpatient Encounter Prescriptions as of 09/14/2017  Medication Sig  . aspirin 81 MG tablet Take 81 mg by mouth daily.  Marland Kitchen atorvastatin (LIPITOR) 40 MG tablet Take 1 tablet (40 mg total) by mouth daily.  Marland Kitchen losartan (COZAAR) 50 MG tablet Take 1 tablet (50 mg total) by mouth daily.  . Multiple Vitamin (MULTIVITAMIN) tablet Take 1 tablet by mouth daily.  . niacin (NIASPAN) 1000 MG CR tablet Take 1 tablet by mouth once daily as directed with a low fat snack and aspirin 30 minutes prior to taking medication  . Omega-3 Fatty Acids (FISH OIL) 1000 MG CAPS Take 2,000 mg by mouth 2 (two) times daily.   . verapamil  (VERELAN PM) 240 MG 24 hr capsule Take 240 mg by mouth at bedtime.  . vitamin C (ASCORBIC ACID) 500 MG tablet Take 500 mg by mouth daily.  Alveda Reasons 20 MG TABS tablet TK 1 T PO QPM AFTER DINNER   No facility-administered encounter medications on file as of 09/14/2017.     Activities of Daily Living In your present state of health, do you have any difficulty performing the following activities: 09/14/2017  Hearing? N  Vision? N  Difficulty concentrating or making decisions? N  Walking or climbing stairs? N  Dressing or bathing? N  Doing errands, shopping? N  Preparing Food and eating ? N  Using the Toilet? N  In the past six months, have you accidently leaked urine? N  Do you have problems with loss of bowel control? N  Managing your Medications? N  Managing your Finances? N  Housekeeping or managing your Housekeeping? N  Some recent data might be hidden    Patient Care Team: [name], MD as PCP - General (Family Medicine) Melissa Noon, [place] as Referring Physician (Optometry) [name], MD as Consulting Physician (Cardiology) Star Age, MD as Attending Physician (Neurology)   Assessment:     Exercise Activities and Dietary  recommendations Current Exercise Habits: Home exercise routine, Type of exercise: walking, Time (Minutes): 45, Frequency (Times/Week): 7, Weekly Exercise (Minutes/Week): 315, Intensity: Moderate, Exercise limited by: None identified  Goals    . Weight (lb) < 250 lb (113.4 kg)          Patient states that he wants to try to lose weight and weigh under 250 lbs in the near future.      Fall Risk Fall Risk  09/14/2017 06/19/2017 01/08/2017 07/10/2016 10/31/2015  Falls in the past year? No No No No No   Depression Screen PHQ 2/9 Scores 09/14/2017 06/19/2017 01/08/2017 07/10/2016  PHQ - 2 Score 0 0 0 0    Cognitive Function     6CIT Screen 09/14/2017  What Year? 0 points  What month? 0 points  What time? 0 points  Count back from 20 0 points    Months in reverse 0 points  Repeat phrase 0 points  Total Score 0    Immunization History  Administered Date(s) Administered  . Pneumococcal Conjugate-13 10/31/2015  . Pneumococcal Polysaccharide-23 08/11/2013  . Tdap 08/17/2010  . Zoster 04/15/2014   Screening Tests Health Maintenance  Topic Date Due  . INFLUENZA VACCINE  09/14/2018 (Originally 06/17/2017)  . COLONOSCOPY  10/17/2018  . TETANUS/TDAP  08/17/2020  . Hepatitis C Screening  Completed  . PNA vac Low Risk Adult  Completed      Plan:   I have personally reviewed and noted the following in the patient's chart:   . Medical and social history . Use of alcohol, tobacco or illicit drugs  . Current medications and supplements . Functional ability and status . Nutritional status . Physical activity . Advanced directives . List of other physicians . Hospitalizations, surgeries, and ER visits in previous 12 months . Vitals . Screenings to include cognitive, depression, and falls . Referrals and appointments  In addition, I have reviewed and discussed with patient certain preventive protocols, quality metrics, and best practice recommendations. A written personalized care plan for preventive services as well as general preventive health recommendations were provided to patient.  Flu vacine declined. Blood work ordered- patient will come back and have this done when he is fasting for his appointment on 09/17/17.    Andrez Grime, LPN  [id number]

## 2017-09-14 NOTE — Patient Instructions (Addendum)
[name] , Thank you for taking time to come for your Medicare Wellness Visit. I appreciate your ongoing commitment to your health goals. Please review the following plan we discussed and let me know if I can assist you in the future.   Screening recommendations/referrals: Colonoscopy: up to date, next due 10/18/2023 Recommended yearly ophthalmology/optometry visit for glaucoma screening and checkup Recommended yearly dental visit for hygiene and checkup  Vaccinations: Influenza vaccine: declined  Pneumococcal vaccine: up to date Tdap vaccine: up to date, next due 08/17/2020 Shingles vaccine: up to date    Advanced directives:Please bring a copy of your POA (Power of Attorney) and/or Living Will to your next appointment once you get it notarized.   Conditions/risks identified: Try to lose weight and weigh under 250 lbs in the near future.   Next appointment: 09/17/17 @ 8:40 am with Dr. Carlota Watson  Preventive Care 65 Years and Older, Male Preventive care refers to lifestyle choices and visits with your health care provider that can promote health and wellness. What does preventive care include?  A yearly physical exam. This is also called an annual well check.  Dental exams once or twice a year.  Routine eye exams. Ask your health care provider how often you should have your eyes checked.  Personal lifestyle choices, including:  Daily care of your teeth and gums.  Regular physical activity.  Eating a healthy diet.  Avoiding tobacco and drug use.  Limiting alcohol use.  Practicing safe sex.  Taking low doses of aspirin every day.  Taking vitamin and mineral supplements as recommended by your health care provider. What happens during an annual well check? The services and screenings done by your health care provider during your annual well check will depend on your age, overall health, lifestyle risk factors, and family history of disease. Counseling  Your health care  provider may ask you questions about your:  Alcohol use.  Tobacco use.  Drug use.  Emotional well-being.  Home and relationship well-being.  Sexual activity.  Eating habits.  History of falls.  Memory and ability to understand (cognition).  Work and work Statistician. Screening  You may have the following tests or measurements:  Height, weight, and BMI.  Blood pressure.  Lipid and cholesterol levels. These may be checked every 5 years, or more frequently if you are over 60 years old.  Skin check.  Lung cancer screening. You may have this screening every year starting at age 69 if you have a 30-pack-year history of smoking and currently smoke or have quit within the past 15 years.  Fecal occult blood test (FOBT) of the stool. You may have this test every year starting at age 82.  Flexible sigmoidoscopy or colonoscopy. You may have a sigmoidoscopy every 5 years or a colonoscopy every 10 years starting at age 17.  Prostate cancer screening. Recommendations will vary depending on your family history and other risks.  Hepatitis C blood test.  Hepatitis B blood test.  Sexually transmitted disease (STD) testing.  Diabetes screening. This is done by checking your blood sugar (glucose) after you have not eaten for a while (fasting). You may have this done every 1-3 years.  Abdominal aortic aneurysm (AAA) screening. You may need this if you are a current or former smoker.  Osteoporosis. You may be screened starting at age 46 if you are at high risk. Talk with your health care provider about your test results, treatment options, and if necessary, the need for more tests. Vaccines  Your health care provider may recommend certain vaccines, such as:  Influenza vaccine. This is recommended every year.  Tetanus, diphtheria, and acellular pertussis (Tdap, Td) vaccine. You may need a Td booster every 10 years.  Zoster vaccine. You may need this after age 28.  Pneumococcal  13-valent conjugate (PCV13) vaccine. One dose is recommended after age 89.  Pneumococcal polysaccharide (PPSV23) vaccine. One dose is recommended after age 41. Talk to your health care provider about which screenings and vaccines you need and how often you need them. This information is not intended to replace advice given to you by your health care provider. Make sure you discuss any questions you have with your health care provider. Document Released: 11/30/2015 Document Revised: 07/23/2016 Document Reviewed: 09/04/2015 Elsevier Interactive Patient Education  2017 [employer] Prevention in the Home Falls can cause injuries. They can happen to people of all ages. There are many things you can do to make your home safe and to help prevent falls. What can I do on the outside of my home?  Regularly fix the edges of walkways and driveways and fix any cracks.  Remove anything that might make you trip as you walk through a door, such as a raised step or threshold.  Trim any bushes or trees on the path to your home.  Use bright outdoor lighting.  Clear any walking paths of anything that might make someone trip, such as rocks or tools.  Regularly check to see if handrails are loose or broken. Make sure that both sides of any steps have handrails.  Any raised decks and porches should have guardrails on the edges.  Have any leaves, snow, or ice cleared regularly.  Use sand or salt on walking paths during winter.  Clean up any spills in your garage right away. This includes oil or grease spills. What can I do in the bathroom?  Use night lights.  Install grab bars by the toilet and in the tub and shower. Do not use towel bars as grab bars.  Use non-skid mats or decals in the tub or shower.  If you need to sit down in the shower, use a plastic, non-slip stool.  Keep the floor dry. Clean up any water that spills on the floor as soon as it happens.  Remove soap buildup in the  tub or shower regularly.  Attach bath mats securely with double-sided non-slip rug tape.  Do not have throw rugs and other things on the floor that can make you trip. What can I do in the bedroom?  Use night lights.  Make sure that you have a light by your bed that is easy to reach.  Do not use any sheets or blankets that are too big for your bed. They should not hang down onto the floor.  Have a firm chair that has side arms. You can use this for support while you get dressed.  Do not have throw rugs and other things on the floor that can make you trip. What can I do in the kitchen?  Clean up any spills right away.  Avoid walking on wet floors.  Keep items that you use a lot in easy-to-reach places.  If you need to reach something above you, use a strong step stool that has a grab bar.  Keep electrical cords out of the way.  Do not use floor polish or wax that makes floors slippery. If you must use wax, use non-skid floor wax.  Do  not have throw rugs and other things on the floor that can make you trip. What can I do with my stairs?  Do not leave any items on the stairs.  Make sure that there are handrails on both sides of the stairs and use them. Fix handrails that are broken or loose. Make sure that handrails are as long as the stairways.  Check any carpeting to make sure that it is firmly attached to the stairs. Fix any carpet that is loose or worn.  Avoid having throw rugs at the top or bottom of the stairs. If you do have throw rugs, attach them to the floor with carpet tape.  Make sure that you have a light switch at the top of the stairs and the bottom of the stairs. If you do not have them, ask someone to add them for you. What else can I do to help prevent falls?  Wear shoes that:  Do not have high heels.  Have rubber bottoms.  Are comfortable and fit you well.  Are closed at the toe. Do not wear sandals.  If you use a stepladder:  Make sure that it  is fully opened. Do not climb a closed stepladder.  Make sure that both sides of the stepladder are locked into place.  Ask someone to hold it for you, if possible.  Clearly mark and make sure that you can see:  Any grab bars or handrails.  First and last steps.  Where the edge of each step is.  Use tools that help you move around (mobility aids) if they are needed. These include:  Canes.  Walkers.  Scooters.  Crutches.  Turn on the lights when you go into a dark area. Replace any light bulbs as soon as they burn out.  Set up your furniture so you have a clear path. Avoid moving your furniture around.  If any of your floors are uneven, fix them.  If there are any pets around you, be aware of where they are.  Review your medicines with your doctor. Some medicines can make you feel dizzy. This can increase your chance of falling. Ask your doctor what other things that you can do to help prevent falls. This information is not intended to replace advice given to you by your health care provider. Make sure you discuss any questions you have with your health care provider. Document Released: 08/30/2009 Document Revised: 04/10/2016 Document Reviewed: 12/08/2014 Elsevier Interactive Patient Education  2017 [employer].

## 2017-09-17 ENCOUNTER — Ambulatory Visit (INDEPENDENT_AMBULATORY_CARE_PROVIDER_SITE_OTHER): Payer: PPO | Admitting: Family Medicine

## 2017-09-17 ENCOUNTER — Encounter: Payer: Self-pay | Admitting: Family Medicine

## 2017-09-17 VITALS — BP 104/52 | HR 78 | Temp 97.7°F | Resp 16 | Ht 73.0 in | Wt 289.8 lb

## 2017-09-17 DIAGNOSIS — E785 Hyperlipidemia, unspecified: Secondary | ICD-10-CM

## 2017-09-17 DIAGNOSIS — Z125 Encounter for screening for malignant neoplasm of prostate: Secondary | ICD-10-CM

## 2017-09-17 DIAGNOSIS — R7303 Prediabetes: Secondary | ICD-10-CM

## 2017-09-17 DIAGNOSIS — N529 Male erectile dysfunction, unspecified: Secondary | ICD-10-CM | POA: Diagnosis not present

## 2017-09-17 DIAGNOSIS — I481 Persistent atrial fibrillation: Secondary | ICD-10-CM

## 2017-09-17 DIAGNOSIS — I10 Essential (primary) hypertension: Secondary | ICD-10-CM

## 2017-09-17 DIAGNOSIS — I4819 Other persistent atrial fibrillation: Secondary | ICD-10-CM

## 2017-09-17 NOTE — Patient Instructions (Addendum)
  If blood pressure is staying low, or you are feeling lightheaded or dizzy, we may need to decrease the dose of losartan. Let me know.   Let me know or follow up to discuss options besides Viagra if needed.   Recheck in 6 months.    IF you received an x-ray today, you will receive an invoice from [facility]. Please contact [facility] at [phone] with questions or concerns regarding your invoice.   IF you received labwork today, you will receive an invoice from [place]. Please contact [facility] at [phone] with questions or concerns regarding your invoice.   Our billing staff will not be able to assist you with questions regarding bills from these companies.  You will be contacted with the lab results as soon as they are available. The fastest way to get your results is to activate your My Chart account. Instructions are located on the last page of this paperwork. If you have not heard from [place] regarding the results in 2 weeks, please contact this office.
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
## 2017-09-18 LAB — COMPREHENSIVE METABOLIC PANEL
A/G RATIO: 2.2 (ref 1.2–2.2)
ALBUMIN: 4.3 g/dL (ref 3.6–4.8)
ALK PHOS: 69 IU/L (ref 39–117)
ALT: 36 IU/L (ref 0–44)
AST: 20 IU/L (ref 0–40)
BILIRUBIN TOTAL: 0.5 mg/dL (ref 0.0–1.2)
BUN / CREAT RATIO: 14 (ref 10–24)
BUN: 11 mg/dL (ref 8–27)
CHLORIDE: 103 mmol/L (ref 96–106)
CO2: 26 mmol/L (ref 20–29)
Calcium: 9.3 mg/dL (ref 8.6–10.2)
Creatinine, Ser: 0.78 mg/dL (ref 0.76–1.27)
GFR calc Af Amer: 106 mL/min/{1.73_m2} (ref >59)
GFR calc non Af Amer: 92 mL/min/{1.73_m2} (ref >59)
GLOBULIN, TOTAL: 2 g/dL (ref 1.5–4.5)
Glucose: 108 mg/dL — ABNORMAL HIGH (ref 65–99)
POTASSIUM: 5.1 mmol/L (ref 3.5–5.2)
SODIUM: 143 mmol/L (ref 134–144)
Total Protein: 6.3 g/dL (ref 6.0–8.5)

## 2017-09-18 LAB — LIPID PANEL
CHOLESTEROL TOTAL: 122 mg/dL (ref 100–199)
Chol/HDL Ratio: 2.9 ratio (ref 0.0–5.0)
HDL: 42 mg/dL (ref >39)
LDL Calculated: 63 mg/dL (ref 0–99)
Triglycerides: 83 mg/dL (ref 0–149)
VLDL CHOLESTEROL CAL: 17 mg/dL (ref 5–40)

## 2017-09-18 LAB — HEMOGLOBIN A1C
ESTIMATED AVERAGE GLUCOSE: 117 mg/dL
HEMOGLOBIN A1C: 5.7 % — AB (ref 4.8–5.6)

## 2017-09-18 LAB — PSA: PROSTATE SPECIFIC AG, SERUM: 0.6 ng/mL (ref 0.0–4.0)

## 2017-10-20 ENCOUNTER — Ambulatory Visit: Payer: PPO | Admitting: Family Medicine

## 2017-10-20 ENCOUNTER — Other Ambulatory Visit: Payer: Self-pay

## 2017-10-20 ENCOUNTER — Encounter: Payer: Self-pay | Admitting: Family Medicine

## 2017-10-20 VITALS — BP 104/62 | HR 90 | Temp 97.5°F | Resp 18 | Ht 73.0 in | Wt 286.0 lb

## 2017-10-20 DIAGNOSIS — L209 Atopic dermatitis, unspecified: Secondary | ICD-10-CM | POA: Diagnosis not present

## 2017-10-20 DIAGNOSIS — H9319 Tinnitus, unspecified ear: Secondary | ICD-10-CM

## 2017-10-20 DIAGNOSIS — H60543 Acute eczematoid otitis externa, bilateral: Secondary | ICD-10-CM | POA: Diagnosis not present

## 2017-10-20 DIAGNOSIS — R42 Dizziness and giddiness: Secondary | ICD-10-CM

## 2017-10-20 DIAGNOSIS — Z87898 Personal history of other specified conditions: Secondary | ICD-10-CM

## 2017-10-20 DIAGNOSIS — L299 Pruritus, unspecified: Secondary | ICD-10-CM | POA: Diagnosis not present

## 2017-10-20 LAB — GLUCOSE, POCT (MANUAL RESULT ENTRY): POC Glucose: 94 mg/dl (ref 70–99)

## 2017-10-20 LAB — POCT CBC
Granulocyte percent: 55.8 %G (ref 37–80)
HCT, POC: 40.5 % — AB (ref 43.5–53.7)
HEMOGLOBIN: 13.6 g/dL — AB (ref 14.1–18.1)
LYMPH, POC: 2.9 (ref 0.6–3.4)
MCH, POC: 30.2 pg (ref 27–31.2)
MCHC: 33.6 g/dL (ref 31.8–35.4)
MCV: 89.7 fL (ref 80–97)
MID (cbc): 0.6 (ref 0–0.9)
MPV: 6.1 fL (ref 0–99.8)
PLATELET COUNT, POC: 212 10*3/uL (ref 142–424)
POC Granulocyte: 4.4 (ref 2–6.9)
POC LYMPH PERCENT: 37 %L (ref 10–50)
POC MID %: 7.2 % (ref 0–12)
RBC: 4.52 M/uL — AB (ref 4.69–6.13)
RDW, POC: 13.9 %
WBC: 7.8 10*3/uL (ref 4.6–10.2)

## 2017-10-20 MED ORDER — NEOMYCIN-POLYMYXIN-HC 3.5-10000-1 OT SOLN: 3.0000 [drp] | Freq: Three times a day (TID) | OTIC | 0 refills | Status: DC

## 2017-10-20 NOTE — Patient Instructions (Addendum)
Dizziness may be related to lower blood pressures, especially as you are experiencing the symptoms primarily with first standing up. Try stopping losartan for right now, continue your other medications. Check your blood pressure twice per day, and if you are obtaining readings over 160/100, restart losartan at previous dose. Otherwise follow-up in 2 weeks with home readings and bring your blood pressure monitor with you to verify it is obtaining accurate readings. If dizziness is worsening, or any focal weakness between now and then, be seen here or the emergency room right away.   Return to the clinic or go to the nearest emergency room if any of your symptoms worsen or new symptoms occur.  For ear eczema - over the counter hydrocortisone cream up to twice per day to outside of ear and Eucerin lotion twice per day.  If inside ear canal - can use the drops up to 3 times per day for a few days. Return if worse.    Dizziness Dizziness is a common problem. It is a feeling of unsteadiness or light-headedness. You may feel like you are about to faint. Dizziness can lead to injury if you stumble or fall. Anyone can become dizzy, but dizziness is more common in older adults. This condition can be caused by a number of things, including medicines, dehydration, or illness. Follow these instructions at home: Taking these steps may help with your condition: Eating and drinking  Drink enough fluid to keep your urine clear or pale yellow. This helps to keep you from becoming dehydrated. Try to drink more clear fluids, such as water.  Do not drink alcohol.  Limit your caffeine intake if directed by your health care provider.  Limit your salt intake if directed by your health care provider. Activity  Avoid making quick movements. ? Rise slowly from chairs and steady yourself until you feel okay. ? In the morning, first sit up on the side of the bed. When you feel okay, stand slowly while you hold onto  something until you know that your balance is fine.  Move your legs often if you need to stand in one place for a long time. Tighten and relax your muscles in your legs while you are standing.  Do not drive or operate heavy machinery if you feel dizzy.  Avoid bending down if you feel dizzy. Place items in your home so that they are easy for you to reach without leaning over. Lifestyle  Do not use any tobacco products, including cigarettes, chewing tobacco, or electronic cigarettes. If you need help quitting, ask your health care provider.  Try to reduce your stress level, such as with yoga or meditation. Talk with your health care provider if you need help. General instructions  Watch your dizziness for any changes.  Take medicines only as directed by your health care provider. Talk with your health care provider if you think that your dizziness is caused by a medicine that you are taking.  Tell a friend or a family member that you are feeling dizzy. If he or she notices any changes in your behavior, have this person call your health care provider.  Keep all follow-up visits as directed by your health care provider. This is important. Contact a health care provider if:  Your dizziness does not go away.  Your dizziness or light-headedness gets worse.  You feel nauseous.  You have reduced hearing.  You have new symptoms.  You are unsteady on your feet or you feel like the  room is spinning. Get help right away if:  You vomit or have diarrhea and are unable to eat or drink anything.  You have problems talking, walking, swallowing, or using your arms, hands, or legs.  You feel generally weak.  You are not thinking clearly or you have trouble forming sentences. It may take a friend or family member to notice this.  You have chest pain, abdominal pain, shortness of breath, or sweating.  Your vision changes.  You notice any bleeding.  You have a headache.  You have neck  pain or a stiff neck.  You have a fever. This information is not intended to replace advice given to you by your health care provider. Make sure you discuss any questions you have with your health care provider. Document Released: 04/29/2001 Document Revised: 04/10/2016 Document Reviewed: 10/30/2014 Elsevier Interactive Patient Education  2017 [employer].      IF you received an x-ray today, you will receive an invoice from [facility]. Please contact [facility] at [phone] with questions or concerns regarding your invoice.   IF you received labwork today, you will receive an invoice from [facility]. Please contact [facility] at [phone] with questions or concerns regarding your invoice.   Our billing staff will not be able to assist you with questions regarding bills from these companies.  You will be contacted with the lab results as soon as they are available. The fastest way to get your results is to activate your My Chart account. Instructions are located on the last page of this paperwork. If you have not heard from [place] regarding the results in 2 weeks, please contact this office.
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
## 2017-11-05 ENCOUNTER — Other Ambulatory Visit: Payer: Self-pay

## 2017-11-05 ENCOUNTER — Encounter: Payer: Self-pay | Admitting: Family Medicine

## 2017-11-05 ENCOUNTER — Ambulatory Visit (INDEPENDENT_AMBULATORY_CARE_PROVIDER_SITE_OTHER): Payer: PPO | Admitting: Family Medicine

## 2017-11-05 VITALS — BP 130/70 | HR 95 | Temp 97.9°F | Resp 18 | Ht 73.0 in | Wt 284.0 lb

## 2017-11-05 DIAGNOSIS — J069 Acute upper respiratory infection, unspecified: Secondary | ICD-10-CM

## 2017-11-05 DIAGNOSIS — R42 Dizziness and giddiness: Secondary | ICD-10-CM | POA: Diagnosis not present

## 2017-11-05 DIAGNOSIS — I10 Essential (primary) hypertension: Secondary | ICD-10-CM | POA: Diagnosis not present

## 2017-11-05 NOTE — Patient Instructions (Addendum)
I'm glad to hear that dizziness symptoms have improved. Blood pressure overall looks ok here today. Ok to hold off on taking losartan for now. If any return of dizziness, follow-up to look into other causes.  If any heart palpitations with dizziness, be seen right away as I could be related to your atrial fibrillation.  Your symptoms likely were related to a virus or common cold. The patient continued to improve. See information below. Return to the clinic or go to the nearest emergency room if any of your symptoms worsen or new symptoms occur.    Upper Respiratory Infection, Adult Most upper respiratory infections (URIs) are caused by a virus. A URI affects the nose, throat, and upper air passages. The most common type of URI is often called "the common cold." Follow these instructions at home:  Take medicines only as told by your doctor.  Gargle warm saltwater or take cough drops to comfort your throat as told by your doctor.  Use a warm mist humidifier or inhale steam from a shower to increase air moisture. This may make it easier to breathe.  Drink enough fluid to keep your pee (urine) clear or pale yellow.  Eat soups and other clear broths.  Have a healthy diet.  Rest as needed.  Go back to work when your fever is gone or your doctor says it is okay. ? You may need to stay home longer to avoid giving your URI to others. ? You can also wear a face mask and wash your hands often to prevent spread of the virus.  Use your inhaler more if you have asthma.  Do not use any tobacco products, including cigarettes, chewing tobacco, or electronic cigarettes. If you need help quitting, ask your doctor. Contact a doctor if:  You are getting worse, not better.  Your symptoms are not helped by medicine.  You have chills.  You are getting more short of breath.  You have brown or red mucus.  You have yellow or brown discharge from your nose.  You have pain in your face, especially  when you bend forward.  You have a fever.  You have puffy (swollen) neck glands.  You have pain while swallowing.  You have white areas in the back of your throat. Get help right away if:  You have very bad or constant: ? Headache. ? Ear pain. ? Pain in your forehead, behind your eyes, and over your cheekbones (sinus pain). ? Chest pain.  You have long-lasting (chronic) lung disease and any of the following: ? Wheezing. ? Long-lasting cough. ? Coughing up blood. ? A change in your usual mucus.  You have a stiff neck.  You have changes in your: ? Vision. ? Hearing. ? Thinking. ? Mood. This information is not intended to replace advice given to you by your health care provider. Make sure you discuss any questions you have with your health care provider. Document Released: 04/21/2008 Document Revised: 07/06/2016 Document Reviewed: 02/08/2014 Elsevier Interactive Patient Education  2018 [employer].   IF you received an x-ray today, you will receive an invoice from [facility] Radiology. Please contact [facility]iology at [phone] with questions or concerns regarding your invoice.   IF you received labwork today, you will receive an invoice from [place]. Please contact [facility] at [phone] with questions or concerns regarding your invoice.   Our billing staff will not be able to assist you with questions regarding bills from these companies.  You will be contacted with the  lab results as soon as they are available. The fastest way to get your results is to activate your My Chart account. Instructions are located on the last page of this paperwork. If you have not heard from us regarding the results in 2 weeks, please contact this office.     
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
## 2017-12-06 ENCOUNTER — Encounter: Payer: Self-pay | Admitting: Adult Health

## 2017-12-08 ENCOUNTER — Ambulatory Visit (INDEPENDENT_AMBULATORY_CARE_PROVIDER_SITE_OTHER): Payer: PPO | Admitting: Neurology

## 2017-12-08 ENCOUNTER — Encounter: Payer: Self-pay | Admitting: Neurology

## 2017-12-08 VITALS — BP 120/76 | HR 64 | Ht 73.0 in | Wt 291.0 lb

## 2017-12-08 DIAGNOSIS — G4733 Obstructive sleep apnea (adult) (pediatric): Secondary | ICD-10-CM

## 2017-12-08 DIAGNOSIS — Z9989 Dependence on other enabling machines and devices: Secondary | ICD-10-CM

## 2017-12-08 NOTE — Patient Instructions (Signed)
Keep up the good work with your CPAP, please work on weight loss.  We can see you in 1 year, you can see one of our nurse practitioners as you are stable. I will see you after that.
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
## 2017-12-17 DIAGNOSIS — G4733 Obstructive sleep apnea (adult) (pediatric): Secondary | ICD-10-CM | POA: Diagnosis not present

## 2017-12-24 DIAGNOSIS — G4733 Obstructive sleep apnea (adult) (pediatric): Secondary | ICD-10-CM | POA: Diagnosis not present

## 2017-12-24 DIAGNOSIS — I2510 Atherosclerotic heart disease of native coronary artery without angina pectoris: Secondary | ICD-10-CM | POA: Diagnosis not present

## 2017-12-24 DIAGNOSIS — I482 Chronic atrial fibrillation: Secondary | ICD-10-CM | POA: Diagnosis not present

## 2017-12-24 DIAGNOSIS — E7800 Pure hypercholesterolemia, unspecified: Secondary | ICD-10-CM | POA: Diagnosis not present

## 2018-01-04 ENCOUNTER — Telehealth: Payer: Self-pay | Admitting: Family Medicine

## 2018-01-04 NOTE — Telephone Encounter (Signed)
Copied from [place] [id number]. Topic: Quick Communication - See Telephone Encounter >> Jan 04, 2018 11:29 AM Aurelio Brash B wrote: CRM for notification. See Telephone encounter for:  PT states he was missing  niacin (NIASPAN) 1000 MG CR tablet   in his recent refill request,  he is requesting this medication be refilled now   [facility] [place], [place] [phone] (Phone) [phone] (Fax)     01/04/18.

## 2018-01-05 ENCOUNTER — Other Ambulatory Visit: Payer: Self-pay

## 2018-01-05 DIAGNOSIS — E785 Hyperlipidemia, unspecified: Secondary | ICD-10-CM

## 2018-01-05 MED ORDER — NIACIN ER (ANTIHYPERLIPIDEMIC) 1000 MG PO TBCR: EXTENDED_RELEASE_TABLET | ORAL | 1 refills | Status: DC

## 2018-01-05 NOTE — Telephone Encounter (Signed)
RX sent

## 2018-03-18 ENCOUNTER — Encounter: Payer: Self-pay | Admitting: Family Medicine

## 2018-03-18 ENCOUNTER — Ambulatory Visit (INDEPENDENT_AMBULATORY_CARE_PROVIDER_SITE_OTHER): Payer: PPO | Admitting: Family Medicine

## 2018-03-18 VITALS — BP 120/70 | HR 80 | Temp 98.7°F | Ht 73.0 in | Wt 288.4 lb

## 2018-03-18 DIAGNOSIS — Z7901 Long term (current) use of anticoagulants: Secondary | ICD-10-CM | POA: Diagnosis not present

## 2018-03-18 DIAGNOSIS — E785 Hyperlipidemia, unspecified: Secondary | ICD-10-CM

## 2018-03-18 DIAGNOSIS — Z130 Encounter for screening for diseases of the blood and blood-forming organs and certain disorders involving the immune mechanism: Secondary | ICD-10-CM

## 2018-03-18 DIAGNOSIS — R7303 Prediabetes: Secondary | ICD-10-CM

## 2018-03-18 LAB — CBC
HEMATOCRIT: 40.5 % (ref 37.5–51.0)
Hemoglobin: 13.8 g/dL (ref 13.0–17.7)
MCH: 30.5 pg (ref 26.6–33.0)
MCHC: 34.1 g/dL (ref 31.5–35.7)
MCV: 89 fL (ref 79–97)
PLATELETS: 185 10*3/uL (ref 150–379)
RBC: 4.53 x10E6/uL (ref 4.14–5.80)
RDW: 14 % (ref 12.3–15.4)
WBC: 7.1 10*3/uL (ref 3.4–10.8)

## 2018-03-18 LAB — LIPID PANEL
CHOL/HDL RATIO: 3.2 ratio (ref 0.0–5.0)
Cholesterol, Total: 116 mg/dL (ref 100–199)
HDL: 36 mg/dL — ABNORMAL LOW (ref >39)
LDL Calculated: 57 mg/dL (ref 0–99)
Triglycerides: 116 mg/dL (ref 0–149)
VLDL CHOLESTEROL CAL: 23 mg/dL (ref 5–40)

## 2018-03-18 LAB — HEMOGLOBIN A1C
Est. average glucose Bld gHb Est-mCnc: 117 mg/dL
HEMOGLOBIN A1C: 5.7 % — AB (ref 4.8–5.6)

## 2018-03-18 LAB — COMPREHENSIVE METABOLIC PANEL
ALK PHOS: 69 IU/L (ref 39–117)
ALT: 27 IU/L (ref 0–44)
AST: 24 IU/L (ref 0–40)
Albumin/Globulin Ratio: 2.2 (ref 1.2–2.2)
Albumin: 4.4 g/dL (ref 3.6–4.8)
BILIRUBIN TOTAL: 0.6 mg/dL (ref 0.0–1.2)
BUN/Creatinine Ratio: 15 (ref 10–24)
BUN: 12 mg/dL (ref 8–27)
CHLORIDE: 102 mmol/L (ref 96–106)
CO2: 23 mmol/L (ref 20–29)
CREATININE: 0.78 mg/dL (ref 0.76–1.27)
Calcium: 9.1 mg/dL (ref 8.6–10.2)
GFR calc Af Amer: 106 mL/min/{1.73_m2} (ref >59)
GFR calc non Af Amer: 92 mL/min/{1.73_m2} (ref >59)
GLUCOSE: 114 mg/dL — AB (ref 65–99)
Globulin, Total: 2 g/dL (ref 1.5–4.5)
Potassium: 4.2 mmol/L (ref 3.5–5.2)
Sodium: 140 mmol/L (ref 134–144)
TOTAL PROTEIN: 6.4 g/dL (ref 6.0–8.5)

## 2018-03-18 MED ORDER — NIACIN ER (ANTIHYPERLIPIDEMIC) 1000 MG PO TBCR: EXTENDED_RELEASE_TABLET | ORAL | 1 refills | Status: DC

## 2018-03-18 MED ORDER — ATORVASTATIN CALCIUM 40 MG PO TABS: 40.0000 mg | ORAL_TABLET | Freq: Every day | ORAL | 2 refills | Status: DC
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
## 2018-03-18 NOTE — Patient Instructions (Addendum)
  No changes in meds at this time. Continue to work on exercise/activity for weight loss. Lab work should be available within 2 weeks.   Thanks for coming in today.    IF you received an x-ray today, you will receive an invoice from [facility]. Please contact [facility] at [phone] with questions or concerns regarding your invoice.   IF you received labwork today, you will receive an invoice from [place]. Please contact [facility] at [phone] with questions or concerns regarding your invoice.   Our billing staff will not be able to assist you with questions regarding bills from these companies.  You will be contacted with the lab results as soon as they are available. The fastest way to get your results is to activate your My Chart account. Instructions are located on the last page of this paperwork. If you have not heard from [place] regarding the results in 2 weeks, please contact this office.

## 2018-03-19 DIAGNOSIS — G4733 Obstructive sleep apnea (adult) (pediatric): Secondary | ICD-10-CM | POA: Diagnosis not present

## 2018-06-10 DIAGNOSIS — D0461 Carcinoma in situ of skin of right upper limb, including shoulder: Secondary | ICD-10-CM | POA: Diagnosis not present

## 2018-06-10 DIAGNOSIS — L738 Other specified follicular disorders: Secondary | ICD-10-CM | POA: Diagnosis not present

## 2018-06-10 DIAGNOSIS — D485 Neoplasm of uncertain behavior of skin: Secondary | ICD-10-CM | POA: Diagnosis not present

## 2018-06-10 DIAGNOSIS — L820 Inflamed seborrheic keratosis: Secondary | ICD-10-CM | POA: Diagnosis not present

## 2018-06-10 DIAGNOSIS — L570 Actinic keratosis: Secondary | ICD-10-CM | POA: Diagnosis not present

## 2018-07-30 ENCOUNTER — Encounter: Payer: Self-pay | Admitting: Family Medicine

## 2018-07-30 ENCOUNTER — Ambulatory Visit (INDEPENDENT_AMBULATORY_CARE_PROVIDER_SITE_OTHER): Payer: PPO | Admitting: Family Medicine

## 2018-07-30 ENCOUNTER — Other Ambulatory Visit: Payer: Self-pay

## 2018-07-30 VITALS — BP 149/79 | HR 85 | Temp 97.9°F | Ht 73.0 in | Wt 279.6 lb

## 2018-07-30 DIAGNOSIS — B002 Herpesviral gingivostomatitis and pharyngotonsillitis: Secondary | ICD-10-CM | POA: Diagnosis not present

## 2018-07-30 DIAGNOSIS — Z87898 Personal history of other specified conditions: Secondary | ICD-10-CM | POA: Diagnosis not present

## 2018-07-30 DIAGNOSIS — L0100 Impetigo, unspecified: Secondary | ICD-10-CM | POA: Diagnosis not present

## 2018-07-30 MED ORDER — CEPHALEXIN 500 MG PO CAPS: 500.0000 mg | ORAL_CAPSULE | Freq: Two times a day (BID) | ORAL | 0 refills | Status: DC

## 2018-07-30 MED ORDER — VALACYCLOVIR HCL 1 G PO TABS: 2000.0000 mg | ORAL_TABLET | Freq: Once | ORAL | 2 refills | Status: AC

## 2018-07-30 NOTE — Patient Instructions (Addendum)
Rash around mouth may be initially due to cold sore virus. We can try valtrex, but it may be past most effective time. Take 2 pills now, 2 more in 12 hours only. If outbreak of cold sores in future, can use same dose (2 pills once, repeat dose once in 12 hours).   If any return of fever, or worsening symptoms please return for recheck.  Return to the clinic or go to the nearest emergency room if any of your symptoms worsen or new symptoms occur. Impetigo, Adult Impetigo is an infection of the skin. It commonly occurs in young children, but it can also occur in adults. The infection causes itchy blisters and sores that produce brownish-yellow fluid. As the fluid dries, it forms a thick, honey-colored crust. These skin changes usually occur on the face but can also affect other areas of the body. Impetigo usually goes away in 7-10 days with treatment. What are the causes? Impetigo is caused by two types of bacteria. It may be caused by staphylococci or streptococci bacteria. These bacteria cause impetigo when they get under the surface of the skin. This often happens after some damage to the skin, such as damage from:  Cuts, scrapes, or scratches.  Insect bites, especially when you scratch the area of a bite.  Chickenpox or other illnesses that cause open skin sores.  Nail biting or chewing.  Impetigo is contagious and can spread easily from one person to another. This may occur through close skin contact or by sharing towels, clothing, or other items with a person who has the infection. What increases the risk? Some things that can increase the risk of getting this infection include:  Playing sports that include skin-to-skin contact with others.  Having a skin condition with open sores.  Having many skin cuts or scrapes.  Living in an area that has high humidity levels.  Having poor hygiene.  Having high levels of staphylococci in your nose.  What are the signs or symptoms? Impetigo  usually starts out as small blisters, often on the face. The blisters then break open and turn into tiny sores (lesions) with a yellow crust. In some cases, the blisters cause itching or burning. With scratching, irritation, or lack of treatment, these small lesions may get larger. Scratching can also cause impetigo to spread to other parts of the body. The bacteria can get under the fingernails and spread when you touch another area of your skin. Other possible symptoms include:  Larger blisters.  Pus.  Swollen lymph glands.  How is this diagnosed? This condition is usually diagnosed during a physical exam. A skin sample or sample of fluid from a blister may be taken for lab tests that involve growing bacteria (culture test). This can help confirm the diagnosis or help determine the best treatment. How is this treated? Mild impetigo can be treated with prescription antibiotic cream. Oral antibiotic medicine may be used in more severe cases. Medicines for itching may also be used. Follow these instructions at home:  Take medicines only as directed by your health care provider.  To help prevent impetigo from spreading to other body areas: ? Keep your fingernails short and clean. ? Do not scratch the blisters or sores. ? Cover infected areas, if necessary, to keep from scratching.  Gently wash the infected areas with antibiotic soap and water.  Soak crusted areas in warm, soapy water using antibiotic soap. ? Gently rub the areas to remove crusts. Do not scrub.  Wash your  hands often to avoid spreading this infection.  Stay home until you have used an antibiotic cream for 48 hours (2 days) or an oral antibiotic medicine for 24 hours (1 day). You should only return to work and activities with other people if your skin shows significant improvement. How is this prevented? To keep the infection from spreading:  Stay home until you have used an antibiotic cream for 48 hours or an oral  antibiotic for 24 hours.  Wash your hands often.  Do not engage in skin-to-skin contact with other people while you have still have blisters.  Do not share towels, washcloths, or bedding with others while you have the infection.  Contact a health care provider if:  You develop more blisters or sores despite treatment.  Other family members get sores.  Your skin sores are not improving after 48 hours of treatment.  You have a fever. Get help right away if:  You see spreading redness or swelling of the skin around your sores.  You see red streaks coming from your sores.  You develop a sore throat. This information is not intended to replace advice given to you by your health care provider. Make sure you discuss any questions you have with your health care provider. Document Released: 11/24/2014 Document Revised: 04/10/2016 Document Reviewed: 10/17/2014 Elsevier Interactive Patient Education  2017 Else[employer] Inc[employer]    Cold Sore A cold sore, also called a fever blister, is a skin infection that causes small, fluid-filled sores to form inside of the mouth or on the lips, gums, nose, chin, or cheeks. Cold sores can spread to other parts of the body, such as the eyes or fingers. In some people with other medical conditions, cold sores can spread to multiple other body sites, including the genitals. Cold sores can be spread or passed from person to person (contagious) until the sores crust over completely. What are the causes? Cold sores are caused by the herpes simplex virus (HSV-1). HSV-1 is closely related to the virus that causes genital herpes (HSV-2), but these viruses are not the same. Once a person is infected with HSV-1, the virus remains permanently in the body. HSV-1 is spread from person to person through close contact, such as through kissing, touching the affected area, or sharing personal items such as lip balm, razors, or eating utensils. What increases the risk? A cold sore  outbreak is more likely to develop in people who:  Are tired, stressed, or sick.  Are menstruating.  Are pregnant.  Take certain medicines.  Are exposed to cold weather or too much sun.  What are the signs or symptoms? Symptoms of a cold sore outbreak often go through different stages. Here is how a cold sore develops:  Tingling, itching, or burning is felt 1-2 days before the outbreak.  Fluid-filled blisters appear on the lips, inside the mouth, on the nose, or on the cheeks.  The blisters start to ooze clear fluid.  The blisters dry up and a yellow crust appears in its place.  The crust falls off.  Other symptoms include:  Fever.  Sore throat.  Headache.  Muscle aches.  Swollen neck glands.  You also may not have any symptoms. How is this diagnosed? This condition is often diagnosed based on your medical history and a physical exam. Your health care provider may swab your sore and then examine it in the lab. Rarely, blood tests may be done to check for HSV-1. How is this treated? There is no  cure for cold sores or HSV-1. There also is no vaccine for HSV-1. Most cold sores go away on their own without treatment within two weeks. Medicines cannot make the infection go away, but medicines can:  Help relieve some of the pain associated with the sores.  Work to stop the virus from multiplying.  Shorten healing time.  Medicines may be in the form of creams, gels, pills, or a shot. Follow these instructions at home: Medicines  Take or apply over-the-counter and prescription medicines only as told by your health care provider.  Use a cotton-tip swab to apply creams or gels to your sores. Sore Care  Do not touch the sores or pick the scabs.  Wash your hands often. Do not touch your eyes without washing your hands first.  Keep the sores clean and dry.  If directed, apply ice to the sores: ? Put ice in a plastic bag. ? Place a towel between your skin and the  bag. ? Leave the ice on for 20 minutes, 2-3 times per day. Lifestyle  Do not kiss, have oral sex, or share personal items until your sores heal.  Eat a soft, bland diet. Avoid eating hot, cold, or salty foods. These can hurt your mouth.  Use a straw if it hurts to drink out of a glass.  Avoid the sun and limit your stress if these things trigger outbreaks. If sun causes cold sores, apply sunscreen on your lips before being out in the sun. Contact a health care provider if:  You have symptoms for more than two weeks.  You have pus coming from the sores.  You have redness that is spreading.  You have pain or irritation in your eye.  You get sores on your genitals.  Your sores do not heal within two weeks.  You have frequent cold sore outbreaks. Get help right away if:  You have a fever and your symptoms suddenly get worse.  You have a headache and confusion. This information is not intended to replace advice given to you by your health care provider. Make sure you discuss any questions you have with your health care provider. Document Released: 10/31/2000 Document Revised: 06/27/2016 Document Reviewed: 08/24/2015 Elsevier Interactive Patient Education  [employer].   If you have lab work done today you will be contacted with your lab results within the next 2 weeks.  If you have not heard from [place] then please contact us. The fastest way to get your results is to register for My Chart.   IF you received an x-ray today, you will receive an invoice from [facility]. Please contact [facility] at [phone] with questions or concerns regarding your invoice.   IF you received labwork today, you will receive an invoice from [place]. Please contact [facility] at [phone] with questions or concerns regarding your invoice.   Our billing staff will not be able to assist you with questions regarding bills from these companies.  You will be contacted with  the lab results as soon as they are available. The fastest way to get your results is to activate your My Chart account. Instructions are located on the last page of this paperwork. If you have not heard from [place] regarding the results in 2 weeks, please contact this office.
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
## 2018-09-20 ENCOUNTER — Encounter: Payer: Self-pay | Admitting: Family Medicine

## 2018-09-20 ENCOUNTER — Ambulatory Visit (INDEPENDENT_AMBULATORY_CARE_PROVIDER_SITE_OTHER): Payer: PPO | Admitting: Family Medicine

## 2018-09-20 ENCOUNTER — Ambulatory Visit: Payer: PPO

## 2018-09-20 VITALS — BP 128/75 | HR 80 | Temp 97.8°F | Ht 73.0 in | Wt 290.2 lb

## 2018-09-20 DIAGNOSIS — I4891 Unspecified atrial fibrillation: Secondary | ICD-10-CM

## 2018-09-20 DIAGNOSIS — Z0000 Encounter for general adult medical examination without abnormal findings: Secondary | ICD-10-CM

## 2018-09-20 DIAGNOSIS — G4733 Obstructive sleep apnea (adult) (pediatric): Secondary | ICD-10-CM | POA: Diagnosis not present

## 2018-09-20 DIAGNOSIS — Z113 Encounter for screening for infections with a predominantly sexual mode of transmission: Secondary | ICD-10-CM | POA: Diagnosis not present

## 2018-09-20 DIAGNOSIS — Z7901 Long term (current) use of anticoagulants: Secondary | ICD-10-CM | POA: Diagnosis not present

## 2018-09-20 DIAGNOSIS — Z9989 Dependence on other enabling machines and devices: Secondary | ICD-10-CM | POA: Diagnosis not present

## 2018-09-20 DIAGNOSIS — Z202 Contact with and (suspected) exposure to infections with a predominantly sexual mode of transmission: Secondary | ICD-10-CM

## 2018-09-20 DIAGNOSIS — R7303 Prediabetes: Secondary | ICD-10-CM | POA: Diagnosis not present

## 2018-09-20 DIAGNOSIS — E785 Hyperlipidemia, unspecified: Secondary | ICD-10-CM

## 2018-09-20 MED ORDER — VERAPAMIL HCL ER 240 MG PO CP24: 240.0000 mg | ORAL_CAPSULE | Freq: Every day | ORAL | 1 refills | Status: DC

## 2018-09-20 MED ORDER — ATORVASTATIN CALCIUM 40 MG PO TABS: 40.0000 mg | ORAL_TABLET | Freq: Every day | ORAL | 2 refills | Status: DC

## 2018-09-20 MED ORDER — NIACIN ER (ANTIHYPERLIPIDEMIC) 1000 MG PO TBCR: EXTENDED_RELEASE_TABLET | ORAL | 1 refills | Status: DC

## 2018-09-20 NOTE — Patient Instructions (Addendum)
You are due for repeat colonoscopy based on last test. Please call gastroenterologist and schedule that appointment. [phone].  I will check the PSA as discussed.   I do recommend flu shot - let me know if you change your mind.  I would also recommend updated shingles vaccine - that can be given at your pharmacy.  Shingrix.  I would recommend meeting with dentist.   Some form of low intensity exercise/activity most days per week.  Bring copy of living will when completed.   No med changes at this time.   I will check herpesvirus testing and other STI's as discussed. Return to office if any new rash or new symptoms.   Thanks for coming in today.      Preventive Care 69 Years and Older, Male Preventive care refers to lifestyle choices and visits with your health care provider that can promote health and wellness. What does preventive care include?  A yearly physical exam. This is also called an annual well check.  Dental exams once or twice a year.  Routine eye exams. Ask your health care provider how often you should have your eyes checked.  Personal lifestyle choices, including: ? Daily care of your teeth and gums. ? Regular physical activity. ? Eating a healthy diet. ? Avoiding tobacco and drug use. ? Limiting alcohol use. ? Practicing safe sex. ? Taking low doses of aspirin every day. ? Taking vitamin and mineral supplements as recommended by your health care provider. What happens during an annual well check? The services and screenings done by your health care provider during your annual well check will depend on your age, overall health, lifestyle risk factors, and family history of disease. Counseling Your health care provider may ask you questions about your:  Alcohol use.  Tobacco use.  Drug use.  Emotional well-being.  Home and relationship well-being.  Sexual activity.  Eating habits.  History of falls.  Memory and ability to understand  (cognition).  Work and work Statistician.  Screening You may have the following tests or measurements:  Height, weight, and BMI.  Blood pressure.  Lipid and cholesterol levels. These may be checked every 5 years, or more frequently if you are over 64 years old.  Skin check.  Lung cancer screening. You may have this screening every year starting at age 30 if you have a 30-pack-year history of smoking and currently smoke or have quit within the past 15 years.  Fecal occult blood test (FOBT) of the stool. You may have this test every year starting at age 60.  Flexible sigmoidoscopy or colonoscopy. You may have a sigmoidoscopy every 5 years or a colonoscopy every 10 years starting at age 54.  Prostate cancer screening. Recommendations will vary depending on your family history and other risks.  Hepatitis C blood test.  Hepatitis B blood test.  Sexually transmitted disease (STD) testing.  Diabetes screening. This is done by checking your blood sugar (glucose) after you have not eaten for a while (fasting). You may have this done every 1-3 years.  Abdominal aortic aneurysm (AAA) screening. You may need this if you are a current or former smoker.  Osteoporosis. You may be screened starting at age 109 if you are at high risk.  Talk with your health care provider about your test results, treatment options, and if necessary, the need for more tests. Vaccines Your health care provider may recommend certain vaccines, such as:  Influenza vaccine. This is recommended every year.  Tetanus,  diphtheria, and acellular pertussis (Tdap, Td) vaccine. You may need a Td booster every 10 years.  Varicella vaccine. You may need this if you have not been vaccinated.  Zoster vaccine. You may need this after age 45.  Measles, mumps, and rubella (MMR) vaccine. You may need at least one dose of MMR if you were born in 1957 or later. You may also need a second dose.  Pneumococcal 13-valent conjugate  (PCV13) vaccine. One dose is recommended after age 34.  Pneumococcal polysaccharide (PPSV23) vaccine. One dose is recommended after age 99.  Meningococcal vaccine. You may need this if you have certain conditions.  Hepatitis A vaccine. You may need this if you have certain conditions or if you travel or work in places where you may be exposed to hepatitis A.  Hepatitis B vaccine. You may need this if you have certain conditions or if you travel or work in places where you may be exposed to hepatitis B.  Haemophilus influenzae type b (Hib) vaccine. You may need this if you have certain risk factors.  Talk to your health care provider about which screenings and vaccines you need and how often you need them. This information is not intended to replace advice given to you by your health care provider. Make sure you discuss any questions you have with your health care provider. Document Released: 11/30/2015 Document Revised: 07/23/2016 Document Reviewed: 09/04/2015 Elsevier Interactive Patient Education  [employer].    If you have lab work done today you will be contacted with your lab results within the next 2 weeks.  If you have not heard from [place] then please contact us. The fastest way to get your results is to register for My Chart.   IF you received an x-ray today, you will receive an invoice from [facility]. Please contact [facility] at [phone] with questions or concerns regarding your invoice.   IF you received labwork today, you will receive an invoice from [place]. Please contact [facility] at [phone] with questions or concerns regarding your invoice.   Our billing staff will not be able to assist you with questions regarding bills from these companies.  You will be contacted with the lab results as soon as they are available. The fastest way to get your results is to activate your My Chart account. Instructions are located on the last page of this  paperwork. If you have not heard from [place] regarding the results in 2 weeks, please contact this office.
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
## 2018-09-21 LAB — GC/CHLAMYDIA PROBE AMP
Chlamydia trachomatis, NAA: NEGATIVE
NEISSERIA GONORRHOEAE BY PCR: NEGATIVE

## 2018-09-21 LAB — COMPREHENSIVE METABOLIC PANEL
ALK PHOS: 80 IU/L (ref 39–117)
ALT: 25 IU/L (ref 0–44)
AST: 19 IU/L (ref 0–40)
Albumin/Globulin Ratio: 2.4 — ABNORMAL HIGH (ref 1.2–2.2)
Albumin: 4.7 g/dL (ref 3.5–4.8)
BILIRUBIN TOTAL: 1 mg/dL (ref 0.0–1.2)
BUN/Creatinine Ratio: 15 (ref 10–24)
BUN: 11 mg/dL (ref 8–27)
CHLORIDE: 101 mmol/L (ref 96–106)
CO2: 24 mmol/L (ref 20–29)
CREATININE: 0.73 mg/dL — AB (ref 0.76–1.27)
Calcium: 9.4 mg/dL (ref 8.6–10.2)
GFR calc Af Amer: 109 mL/min/{1.73_m2} (ref >59)
GFR calc non Af Amer: 94 mL/min/{1.73_m2} (ref >59)
GLUCOSE: 117 mg/dL — AB (ref 65–99)
Globulin, Total: 2 g/dL (ref 1.5–4.5)
Potassium: 4.5 mmol/L (ref 3.5–5.2)
Sodium: 139 mmol/L (ref 134–144)
Total Protein: 6.7 g/dL (ref 6.0–8.5)

## 2018-09-21 LAB — LIPID PANEL
Chol/HDL Ratio: 3.4 ratio (ref 0.0–5.0)
Cholesterol, Total: 136 mg/dL (ref 100–199)
HDL: 40 mg/dL (ref >39)
LDL Calculated: 76 mg/dL (ref 0–99)
TRIGLYCERIDES: 98 mg/dL (ref 0–149)
VLDL CHOLESTEROL CAL: 20 mg/dL (ref 5–40)

## 2018-09-21 LAB — HIV ANTIBODY (ROUTINE TESTING W REFLEX): HIV Screen 4th Generation wRfx: NONREACTIVE

## 2018-09-21 LAB — CBC
Hematocrit: 42.2 % (ref 37.5–51.0)
Hemoglobin: 13.9 g/dL (ref 13.0–17.7)
MCH: 29.8 pg (ref 26.6–33.0)
MCHC: 32.9 g/dL (ref 31.5–35.7)
MCV: 91 fL (ref 79–97)
Platelets: 204 10*3/uL (ref 150–450)
RBC: 4.66 x10E6/uL (ref 4.14–5.80)
RDW: 13.1 % (ref 12.3–15.4)
WBC: 7.3 10*3/uL (ref 3.4–10.8)

## 2018-09-21 LAB — HSV(HERPES SIMPLEX VRS) I + II AB-IGG
HSV 1 GLYCOPROTEIN G AB, IGG: 57.5 {index} — AB (ref 0.00–0.90)
HSV 2 IgG, Type Spec: <0.91 index (ref 0.00–0.90)

## 2018-09-21 LAB — HEMOGLOBIN A1C
Est. average glucose Bld gHb Est-mCnc: 117 mg/dL
HEMOGLOBIN A1C: 5.7 % — AB (ref 4.8–5.6)

## 2018-09-21 LAB — RPR: RPR: NONREACTIVE

## 2018-11-21 ENCOUNTER — Encounter: Payer: Self-pay | Admitting: Gastroenterology

## 2018-12-09 ENCOUNTER — Ambulatory Visit: Payer: PPO | Admitting: Adult Health

## 2018-12-09 ENCOUNTER — Telehealth: Payer: Self-pay | Admitting: *Deleted

## 2018-12-09 NOTE — Telephone Encounter (Signed)
Called patient and informed him the NP is sick, need to reschedule FU today. Rescheduled with Janett Billow NP for 12/27/18 and advised he arrive no later than 7:30 am, bring new insurance card. He verbalized understanding.

## 2018-12-13 DIAGNOSIS — L821 Other seborrheic keratosis: Secondary | ICD-10-CM | POA: Diagnosis not present

## 2018-12-13 DIAGNOSIS — D485 Neoplasm of uncertain behavior of skin: Secondary | ICD-10-CM | POA: Diagnosis not present

## 2018-12-13 DIAGNOSIS — L300 Nummular dermatitis: Secondary | ICD-10-CM | POA: Diagnosis not present

## 2018-12-13 DIAGNOSIS — L570 Actinic keratosis: Secondary | ICD-10-CM | POA: Diagnosis not present

## 2018-12-13 DIAGNOSIS — L812 Freckles: Secondary | ICD-10-CM | POA: Diagnosis not present

## 2018-12-13 DIAGNOSIS — C44529 Squamous cell carcinoma of skin of other part of trunk: Secondary | ICD-10-CM | POA: Diagnosis not present

## 2018-12-24 ENCOUNTER — Encounter: Payer: Self-pay | Admitting: Cardiology

## 2018-12-24 DIAGNOSIS — I2510 Atherosclerotic heart disease of native coronary artery without angina pectoris: Secondary | ICD-10-CM | POA: Insufficient documentation
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
## 2018-12-27 ENCOUNTER — Telehealth: Payer: Self-pay

## 2018-12-27 ENCOUNTER — Ambulatory Visit: Payer: Self-pay | Admitting: Adult Health

## 2018-12-27 ENCOUNTER — Ambulatory Visit: Payer: Self-pay | Admitting: Cardiology
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
## 2018-12-27 NOTE — Telephone Encounter (Signed)
Patient was a no call/no show for their appointment today.   

## 2018-12-28 ENCOUNTER — Encounter: Payer: Self-pay | Admitting: Adult Health

## 2018-12-30 ENCOUNTER — Encounter: Payer: Self-pay | Admitting: Adult Health

## 2018-12-30 ENCOUNTER — Ambulatory Visit: Payer: PPO | Admitting: Adult Health

## 2018-12-30 VITALS — BP 132/82 | HR 81 | Ht 73.0 in | Wt 293.6 lb

## 2018-12-30 DIAGNOSIS — Z9989 Dependence on other enabling machines and devices: Secondary | ICD-10-CM

## 2018-12-30 DIAGNOSIS — G4733 Obstructive sleep apnea (adult) (pediatric): Secondary | ICD-10-CM | POA: Diagnosis not present
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
## 2018-12-30 NOTE — Patient Instructions (Signed)
Your Plan:  Continue current settings of CPAP and continue great compliance!  Contact us or advance home care if you need additional supplies or mask refitting   Follow up in 1 year with Jinny Blossom, NP or call earlier if needed     Thank you for coming to see [place] at [facility]. I hope we have been able to provide you high quality care today.  You may receive a patient satisfaction survey over the next few weeks. We would appreciate your feedback and comments so that we may continue to improve ourselves and the health of our patients.

## 2019-01-04 ENCOUNTER — Encounter: Payer: Self-pay | Admitting: Gastroenterology
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
## 2019-01-20 ENCOUNTER — Encounter: Payer: Self-pay | Admitting: Cardiology

## 2019-01-20 ENCOUNTER — Ambulatory Visit: Payer: PPO | Admitting: Cardiology

## 2019-01-20 VITALS — BP 154/91 | HR 71 | Ht 73.0 in | Wt 293.0 lb

## 2019-01-20 DIAGNOSIS — G4733 Obstructive sleep apnea (adult) (pediatric): Secondary | ICD-10-CM

## 2019-01-20 DIAGNOSIS — Z9989 Dependence on other enabling machines and devices: Secondary | ICD-10-CM

## 2019-01-20 DIAGNOSIS — I10 Essential (primary) hypertension: Secondary | ICD-10-CM | POA: Diagnosis not present

## 2019-01-20 DIAGNOSIS — I4821 Permanent atrial fibrillation: Secondary | ICD-10-CM

## 2019-01-20 DIAGNOSIS — I2510 Atherosclerotic heart disease of native coronary artery without angina pectoris: Secondary | ICD-10-CM | POA: Diagnosis not present

## 2019-01-20 DIAGNOSIS — E669 Obesity, unspecified: Secondary | ICD-10-CM | POA: Diagnosis not present

## 2019-03-21 ENCOUNTER — Telehealth (INDEPENDENT_AMBULATORY_CARE_PROVIDER_SITE_OTHER): Payer: PPO | Admitting: Family Medicine

## 2019-03-21 ENCOUNTER — Telehealth: Payer: Self-pay | Admitting: Family Medicine

## 2019-03-21 ENCOUNTER — Other Ambulatory Visit: Payer: Self-pay

## 2019-03-21 DIAGNOSIS — Z9989 Dependence on other enabling machines and devices: Secondary | ICD-10-CM

## 2019-03-21 DIAGNOSIS — I4891 Unspecified atrial fibrillation: Secondary | ICD-10-CM

## 2019-03-21 DIAGNOSIS — E785 Hyperlipidemia, unspecified: Secondary | ICD-10-CM | POA: Diagnosis not present

## 2019-03-21 DIAGNOSIS — G4733 Obstructive sleep apnea (adult) (pediatric): Secondary | ICD-10-CM

## 2019-03-21 DIAGNOSIS — I10 Essential (primary) hypertension: Secondary | ICD-10-CM

## 2019-03-21 MED ORDER — VERAPAMIL HCL ER 240 MG PO CP24: 240.0000 mg | ORAL_CAPSULE | Freq: Every day | ORAL | 1 refills | Status: DC

## 2019-03-21 MED ORDER — NIACIN ER (ANTIHYPERLIPIDEMIC) 1000 MG PO TBCR: EXTENDED_RELEASE_TABLET | ORAL | 1 refills | Status: DC

## 2019-03-21 MED ORDER — ATORVASTATIN CALCIUM 40 MG PO TABS: 40.0000 mg | ORAL_TABLET | Freq: Every day | ORAL | 2 refills | Status: DC

## 2019-03-21 NOTE — Progress Notes (Signed)
CC- 6 month F/u med review- Patient is not having any issus at this time. Patient have not went to have colonoscopy not the dentist yet stated he is trying to wait till all this pandemic clear up. All patients meds are up to date. No refills at this time.

## 2019-03-21 NOTE — Telephone Encounter (Addendum)
03/21/2019 - PATIENT HAD A TELEMED VISIT WITH DR. Carlota Raspberry ON Monday 03/21/2019 FOR MEDICATION REVIEW. PATIENT IS TO RETURN FOR A COMPLETE PHYSICAL IN 6 MONTHS. I CALLED AND SCHEDULED HIM FOR FRIDAY 09/23/2019 AT 8:20am. HE NEEDS TO BE SCHEDULED ALSO FOR HIS ANNUAL WELLNESS VISIT WITH JULIE. BEST PHONE [phone] (CELL) [place]
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
## 2019-03-21 NOTE — Patient Instructions (Addendum)
  Good talking to you today.  No change in medications.  Follow-up for fasting blood work in the next 6 to 8 weeks or so and coordinate follow-up of colonoscopy with gastroenterologist when able.  Take care.   If you have lab work done today you will be contacted with your lab results within the next 2 weeks.  If you have not heard from [place] then please contact us. The fastest way to get your results is to register for My Chart.   IF you received an x-ray today, you will receive an invoice from [facility]. Please contact [facility] at [phone] with questions or concerns regarding your invoice.   IF you received labwork today, you will receive an invoice from [facility]. Please contact [facility] at [phone] with questions or concerns regarding your invoice.   Our billing staff will not be able to assist you with questions regarding bills from these companies.  You will be contacted with the lab results as soon as they are available. The fastest way to get your results is to activate your My Chart account. Instructions are located on the last page of this paperwork. If you have not heard from [place] regarding the results in 2 weeks, please contact this office.

## 2019-03-24 DIAGNOSIS — C44529 Squamous cell carcinoma of skin of other part of trunk: Secondary | ICD-10-CM | POA: Diagnosis not present

## 2019-03-24 DIAGNOSIS — Z85828 Personal history of other malignant neoplasm of skin: Secondary | ICD-10-CM | POA: Diagnosis not present

## 2019-03-24 DIAGNOSIS — D485 Neoplasm of uncertain behavior of skin: Secondary | ICD-10-CM | POA: Diagnosis not present

## 2019-04-04 ENCOUNTER — Telehealth: Payer: Self-pay | Admitting: Family Medicine

## 2019-04-04 ENCOUNTER — Telehealth: Payer: Self-pay

## 2019-04-04 NOTE — Telephone Encounter (Signed)
Copied from [place] [id number]. Topic: Quick Communication - See Telephone Encounter >> Apr 04, 2019  3:54 PM Loma [place] wrote: CRM for notification. See Telephone encounter for: 04/04/19. [facility] called and Taren at [phone] is stating that they need a confirmation to fill XARELTO 20 MG TABS however tablet  verapamil (VERELAN PM) 240 MG 24 hr capsule and the XARELTO 20 mg these (2) two drugs to be taken together questioned??? as there is a possible interaction between the 2.                      [facility]) - [place], [place] [phone] (Phone) [phone] (Fax) Advise however Lorene Dy # [phone]

## 2019-04-04 NOTE — Telephone Encounter (Signed)
I am aware of the interaction. He is sstable on it

## 2019-04-04 NOTE — Telephone Encounter (Signed)
Taryn from [facility] called stating that they were showing pt on both verapamil and xarelto.  Wanted to make sure you were aware of drug interaction and if it is ok for pt to be on both. I only see xarelto on epic med list. Please advise on this. //ah

## 2019-04-05 NOTE — Telephone Encounter (Signed)
LMOM advising of JG annotation.//ah

## 2019-04-14 NOTE — Telephone Encounter (Signed)
Hi Dr. Einar Gip.  Please review the information below about the possible interaction between Xarelto and Verapamil. Please give recommendation.   [facility] called and Taren at [phone]

## 2019-04-14 NOTE — Telephone Encounter (Signed)
This was the plan from 03/21/2019  "Atrial fibrillation, unspecified type ([place]) - Plan: verapamil (VERELAN PM) 240 MG 24 hr capsule             -Controlled with verapamil rate controlled and on Xarelto for anticoagulation.  Continue routine cardiology follow-up"  Please see message below for the pharmacy stated that there were some possible interactions with the 2 medications of Xarelto and Verapamil.  Please advise on your recommendation

## 2019-04-14 NOTE — Telephone Encounter (Signed)
There is an infection with all the new agents with calcium channel blockers.  But it has not been clinically significant and at least in cardiology world we have not been changing the medication as long as patient has remained stable and there has not been a bleeding event.  Hence do not think we need to change this.

## 2019-04-14 NOTE — Telephone Encounter (Signed)
Reviewed potential interaction, recommended to avoid if creatinine clearance less than 80 but his most recent creatinine clearance was 94.  Additionally risk of bleeding with combination but has been on that regimen for a while.  Can be discussed with his cardiologist to determine if any change in regimen needed.  Please provide pharmacist phone number to [facility], Dr. Einar Gip. Thanks.

## 2019-04-15 NOTE — Telephone Encounter (Signed)
I have called [place] [facility] and left a message with pt name and DOB in regards to the medication interaction. The message stated that both Dr. Einar Gip and Dr. Carlota Raspberry stated that that pt is okay to continue current regimen of medications for pt is currently in the clear for his creatinine levels.  If they have any addition questions or concerns they are able to call back.

## 2019-09-21 ENCOUNTER — Encounter: Payer: PPO | Admitting: Family Medicine

## 2019-09-23 ENCOUNTER — Encounter: Payer: Self-pay | Admitting: Family Medicine

## 2019-09-23 ENCOUNTER — Ambulatory Visit (INDEPENDENT_AMBULATORY_CARE_PROVIDER_SITE_OTHER): Payer: PPO | Admitting: Family Medicine

## 2019-09-23 ENCOUNTER — Other Ambulatory Visit: Payer: Self-pay

## 2019-09-23 VITALS — BP 132/81 | HR 82 | Temp 97.8°F | Wt 287.6 lb

## 2019-09-23 DIAGNOSIS — Z0001 Encounter for general adult medical examination with abnormal findings: Secondary | ICD-10-CM

## 2019-09-23 DIAGNOSIS — Z0000 Encounter for general adult medical examination without abnormal findings: Secondary | ICD-10-CM

## 2019-09-23 DIAGNOSIS — E785 Hyperlipidemia, unspecified: Secondary | ICD-10-CM

## 2019-09-23 DIAGNOSIS — I10 Essential (primary) hypertension: Secondary | ICD-10-CM

## 2019-09-23 DIAGNOSIS — I4891 Unspecified atrial fibrillation: Secondary | ICD-10-CM

## 2019-09-23 MED ORDER — SHINGRIX 50 MCG/0.5ML IM SUSR: 0.5000 mL | Freq: Once | INTRAMUSCULAR | 1 refills | Status: AC

## 2019-09-23 MED ORDER — NIACIN ER (ANTIHYPERLIPIDEMIC) 1000 MG PO TBCR: EXTENDED_RELEASE_TABLET | ORAL | 1 refills | Status: DC

## 2019-09-23 MED ORDER — VERAPAMIL HCL ER 240 MG PO CP24: 240.0000 mg | ORAL_CAPSULE | Freq: Every day | ORAL | 1 refills | Status: DC

## 2019-09-23 MED ORDER — ATORVASTATIN CALCIUM 40 MG PO TABS: 40.0000 mg | ORAL_TABLET | Freq: Every day | ORAL | 2 refills | Status: DC

## 2019-09-23 NOTE — Patient Instructions (Addendum)
No med changes today.  I do recommend scheduling colonoscopy and dental visit when able. Please let me know if there are questions. Take care.    Preventive Care 71 Years and Older, Male Preventive care refers to lifestyle choices and visits with your health care provider that can promote health and wellness. This includes:  A yearly physical exam. This is also called an annual well check.  Regular dental and eye exams.  Immunizations.  Screening for certain conditions.  Healthy lifestyle choices, such as diet and exercise. What can I expect for my preventive care visit? Physical exam Your health care provider will check:  Height and weight. These may be used to calculate body mass index (BMI), which is a measurement that tells if you are at a healthy weight.  Heart rate and blood pressure.  Your skin for abnormal spots. Counseling Your health care provider may ask you questions about:  Alcohol, tobacco, and drug use.  Emotional well-being.  Home and relationship well-being.  Sexual activity.  Eating habits.  History of falls.  Memory and ability to understand (cognition).  Work and work Statistician. What immunizations do I need?  Influenza (flu) vaccine  This is recommended every year. Tetanus, diphtheria, and pertussis (Tdap) vaccine  You may need a Td booster every 10 years. Varicella (chickenpox) vaccine  You may need this vaccine if you have not already been vaccinated. Zoster (shingles) vaccine  You may need this after age 76. Pneumococcal conjugate (PCV13) vaccine  One dose is recommended after age 26. Pneumococcal polysaccharide (PPSV23) vaccine  One dose is recommended after age 41. Measles, mumps, and rubella (MMR) vaccine  You may need at least one dose of MMR if you were born in 1957 or later. You may also need a second dose. Meningococcal conjugate (MenACWY) vaccine  You may need this if you have certain conditions. Hepatitis A  vaccine  You may need this if you have certain conditions or if you travel or work in places where you may be exposed to hepatitis A. Hepatitis B vaccine  You may need this if you have certain conditions or if you travel or work in places where you may be exposed to hepatitis B. Haemophilus influenzae type b (Hib) vaccine  You may need this if you have certain conditions. You may receive vaccines as individual doses or as more than one vaccine together in one shot (combination vaccines). Talk with your health care provider about the risks and benefits of combination vaccines. What tests do I need? Blood tests  Lipid and cholesterol levels. These may be checked every 5 years, or more frequently depending on your overall health.  Hepatitis C test.  Hepatitis B test. Screening  Lung cancer screening. You may have this screening every year starting at age 64 if you have a 30-pack-year history of smoking and currently smoke or have quit within the past 15 years.  Colorectal cancer screening. All adults should have this screening starting at age 31 and continuing until age 64. Your health care provider may recommend screening at age 80 if you are at increased risk. You will have tests every 1-10 years, depending on your results and the type of screening test.  Prostate cancer screening. Recommendations will vary depending on your family history and other risks.  Diabetes screening. This is done by checking your blood sugar (glucose) after you have not eaten for a while (fasting). You may have this done every 1-3 years.  Abdominal aortic aneurysm (AAA)  screening. You may need this if you are a current or former smoker.  Sexually transmitted disease (STD) testing. Follow these instructions at home: Eating and drinking  Eat a diet that includes fresh fruits and vegetables, whole grains, lean protein, and low-fat dairy products. Limit your intake of foods with high amounts of sugar, saturated  fats, and salt.  Take vitamin and mineral supplements as recommended by your health care provider.  Do not drink alcohol if your health care provider tells you not to drink.  If you drink alcohol: ? Limit how much you have to 0-2 drinks a day. ? Be aware of how much alcohol is in your drink. In the [place] one drink equals one 12 oz bottle of beer (355 mL), one 5 oz glass of wine (148 mL), or one 1 oz glass of hard liquor (44 mL). Lifestyle  Take daily care of your teeth and gums.  Stay active. Exercise for at least 30 minutes on 5 or more days each week.  Do not use any products that contain nicotine or tobacco, such as cigarettes, e-cigarettes, and chewing tobacco. If you need help quitting, ask your health care provider.  If you are sexually active, practice safe sex. Use a condom or other form of protection to prevent STIs (sexually transmitted infections).  Talk with your health care provider about taking a low-dose aspirin or statin. What's next?  Visit your health care provider once a year for a well check visit.  Ask your health care provider how often you should have your eyes and teeth checked.  Stay up to date on all vaccines. This information is not intended to replace advice given to you by your health care provider. Make sure you discuss any questions you have with your health care provider. Document Released: 11/30/2015 Document Revised: 10/28/2018 Document Reviewed: 10/28/2018 Elsevier Patient Education  [employer].   If you have lab work done today you will be contacted with your lab results within the next 2 weeks.  If you have not heard from [place] then please contact us. The fastest way to get your results is to register for My Chart.   IF you received an x-ray today, you will receive an invoice from [facility]. Please contact [facility] Radiology at [phone] with questions or concerns regarding your invoice.   IF you received labwork today,  you will receive an invoice from [facility]. Please contact [facility] at [phone] with questions or concerns regarding your invoice.   Our billing staff will not be able to assist you with questions regarding bills from these companies.  You will be contacted with the lab results as soon as they are available. The fastest way to get your results is to activate your My Chart account. Instructions are located on the last page of this paperwork. If you have not heard from [place] regarding the results in 2 weeks, please contact this office.
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
## 2019-09-24 LAB — COMPREHENSIVE METABOLIC PANEL
ALT: 29 IU/L (ref 0–44)
AST: 24 IU/L (ref 0–40)
Albumin/Globulin Ratio: 2.4 — ABNORMAL HIGH (ref 1.2–2.2)
Albumin: 4.7 g/dL (ref 3.7–4.7)
Alkaline Phosphatase: 76 IU/L (ref 39–117)
BUN/Creatinine Ratio: 18 (ref 10–24)
BUN: 13 mg/dL (ref 8–27)
Bilirubin Total: 0.9 mg/dL (ref 0.0–1.2)
CO2: 22 mmol/L (ref 20–29)
Calcium: 9.5 mg/dL (ref 8.6–10.2)
Chloride: 107 mmol/L — ABNORMAL HIGH (ref 96–106)
Creatinine, Ser: 0.74 mg/dL — ABNORMAL LOW (ref 0.76–1.27)
GFR calc Af Amer: 107 mL/min/{1.73_m2} (ref >59)
GFR calc non Af Amer: 93 mL/min/{1.73_m2} (ref >59)
Globulin, Total: 2 g/dL (ref 1.5–4.5)
Glucose: 110 mg/dL — ABNORMAL HIGH (ref 65–99)
Potassium: 4.6 mmol/L (ref 3.5–5.2)
Sodium: 142 mmol/L (ref 134–144)
Total Protein: 6.7 g/dL (ref 6.0–8.5)

## 2019-09-24 LAB — LIPID PANEL
Chol/HDL Ratio: 3.1 ratio (ref 0.0–5.0)
Cholesterol, Total: 123 mg/dL (ref 100–199)
HDL: 40 mg/dL (ref >39)
LDL Chol Calc (NIH): 65 mg/dL (ref 0–99)
Triglycerides: 93 mg/dL (ref 0–149)
VLDL Cholesterol Cal: 18 mg/dL (ref 5–40)

## 2019-09-29 ENCOUNTER — Other Ambulatory Visit: Payer: Self-pay

## 2019-09-29 MED ORDER — XARELTO 20 MG PO TABS: ORAL_TABLET | ORAL | 1 refills | Status: DC

## 2019-09-30 ENCOUNTER — Other Ambulatory Visit: Payer: Self-pay

## 2019-09-30 MED ORDER — XARELTO 20 MG PO TABS: ORAL_TABLET | ORAL | 1 refills | Status: DC

## 2019-10-03 ENCOUNTER — Other Ambulatory Visit: Payer: Self-pay

## 2020-01-05 ENCOUNTER — Ambulatory Visit: Payer: PPO | Admitting: Adult Health

## 2020-01-10 ENCOUNTER — Other Ambulatory Visit: Payer: Self-pay

## 2020-01-10 ENCOUNTER — Ambulatory Visit (INDEPENDENT_AMBULATORY_CARE_PROVIDER_SITE_OTHER): Payer: PPO | Admitting: Adult Health

## 2020-01-10 ENCOUNTER — Encounter: Payer: Self-pay | Admitting: Adult Health

## 2020-01-10 VITALS — BP 139/75 | HR 79 | Temp 98.0°F | Ht 73.0 in | Wt 294.5 lb

## 2020-01-10 DIAGNOSIS — G4733 Obstructive sleep apnea (adult) (pediatric): Secondary | ICD-10-CM

## 2020-01-10 DIAGNOSIS — Z9989 Dependence on other enabling machines and devices: Secondary | ICD-10-CM | POA: Diagnosis not present
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
## 2020-01-10 NOTE — Patient Instructions (Signed)
Continue using CPAP nightly and greater than 4 hours each night °If your symptoms worsen or you develop new symptoms please let us know.  ° °

## 2020-01-20 ENCOUNTER — Encounter: Payer: Self-pay | Admitting: Cardiology

## 2020-01-20 ENCOUNTER — Other Ambulatory Visit: Payer: Self-pay

## 2020-01-20 ENCOUNTER — Ambulatory Visit: Payer: PPO | Admitting: Cardiology

## 2020-01-20 VITALS — BP 148/85 | HR 73 | Temp 97.5°F | Ht 73.0 in | Wt 293.3 lb

## 2020-01-20 DIAGNOSIS — I10 Essential (primary) hypertension: Secondary | ICD-10-CM

## 2020-01-20 DIAGNOSIS — E7800 Pure hypercholesterolemia, unspecified: Secondary | ICD-10-CM

## 2020-01-20 DIAGNOSIS — G4733 Obstructive sleep apnea (adult) (pediatric): Secondary | ICD-10-CM

## 2020-01-20 DIAGNOSIS — I4821 Permanent atrial fibrillation: Secondary | ICD-10-CM | POA: Diagnosis not present

## 2020-01-20 DIAGNOSIS — Z9989 Dependence on other enabling machines and devices: Secondary | ICD-10-CM | POA: Diagnosis not present

## 2020-01-20 DIAGNOSIS — R0600 Dyspnea, unspecified: Secondary | ICD-10-CM | POA: Diagnosis not present

## 2020-01-20 DIAGNOSIS — R0609 Other forms of dyspnea: Secondary | ICD-10-CM

## 2020-01-20 MED ORDER — LOSARTAN POTASSIUM-HCTZ 50-12.5 MG PO TABS: 1.0000 | ORAL_TABLET | Freq: Every morning | ORAL | 3 refills | Status: DC

## 2020-01-20 NOTE — Progress Notes (Signed)
Primary Physician/Referring:  [name], MD  [name], male    DOB: [birth year], 74 y.o.   MRN: [id number]  Chief Complaint  Patient presents with  . Atrial Fibrillation  . Follow-up    29yr   [name]  is a 74 y.o. male  with chroninc atrial fibrillation and asymptomatic from this regard. He presents today for 12 month follow-up. He is tolerating Xarelto well without bleeding diathesis.  He reports that he is still 100% compliant with CPAP.  He quit smoking Dec 2016 and  has remained abstinent from tobacco use since that time, 50 pack year history.  Denies any chest pain, has mild chronic shortness of breath, denies PND, orthopnea, dizziness, syncope, or symptoms suggestive of claudication or TIA.  He has had a negative nuclear stress test in January 2016 but ejection fraction was low probably due to atrial fibrillation and echocardiogram revealed normal LVEF with borderline right ventricular and atrial enlargement.  No change in his weight, feels well.   Past Medical History:  Diagnosis Date  . Atrial fibrillation ([place])   . Coronary artery calcification   . Dyslipidemia   . Erectile dysfunction   . Hyperlipidemia   . Hypertension   . Obesity   . OSA (obstructive sleep apnea)   . Tobacco user 06/22/2013   Pt quit smoking October 2014.    Past Surgical History:  Procedure Laterality Date  . CARDIOVERSION N/A 03/20/2015   Procedure: CARDIOVERSION;  Surgeon: [name], MD;  Location: [facility] ENDOSCOPY;  Service: Cardiovascular;  Laterality: N/A;  . TONSILLECTOMY     age 71  . VASECTOMY     Family History  Problem Relation Age of Onset  . Colon cancer Paternal Grandmother   . Cancer Mother        kind unknown    Social History   Tobacco Use  . Smoking status: Former Smoker    Packs/day: 1.00    Years: 50.00    Pack years: 50.00    Quit date: 08/26/2013    Years since quitting: 6.4  . Smokeless tobacco: Never Used  . Tobacco  comment: 0 cigarettes for 3 weeks  Substance Use Topics  . Alcohol use: Yes    Alcohol/week: 1.0 standard drinks    Types: 1 Standard drinks or equivalent per week    Comment: occasional   ROS  Review of Systems  Constitution: Negative for malaise/fatigue.  Cardiovascular: Positive for dyspnea on exertion and leg swelling. Negative for chest pain.  Respiratory: Positive for snoring (on CPAP).   Gastrointestinal: Negative for melena.   Objective  Blood pressure (!) 148/85, pulse 73, temperature (!) 97.5 F (36.4 C), height 6\' 1"  (1.854 m), weight 293 lb 4.8 oz (133 kg), SpO2 96 %.  Vitals with BMI 01/20/2020 01/10/2020 09/23/2019  Height 6\' 1"  6\' 1"  -  Weight 293 lbs 5 oz 294 lbs 8 oz 287 lbs 10 oz  BMI 123XX123 123XX123 -  Systolic 123456 [id number]  Diastolic 85 75 81  Pulse 73 79 82     Physical Exam  HENT:  Head: Atraumatic.  Cardiovascular: An irregularly irregular rhythm present. Exam reveals no gallop, no S3 and no S4.  No murmur heard. Pulses:      Carotid pulses are 2+ on the right side and 2+ on the left side.      Femoral pulses are 2+ on the right side and 2+ on the left side.  Dorsalis pedis pulses are 2+ on the right side and 0 on the left side.       Posterior tibial pulses are 0 on the right side and 2+ on the left side.  S1 is variable, S2 is normal. No JVD, 2+ ankle edema.  Pulmonary/Chest: Effort normal and breath sounds normal.  Abdominal: Soft. Bowel sounds are normal.   Laboratory examination:   Recent Labs    09/23/19 0946  NA 142  K 4.6  CL 107*  CO2 22  GLUCOSE 110*  BUN 13  CREATININE 0.74*  CALCIUM 9.5  GFRNONAA 93  GFRAA 107   CrCl cannot be calculated (Patient's most recent lab result is older than the maximum 21 days allowed.).  CMP Latest Ref Rng & Units 09/23/2019 09/20/2018 03/18/2018  Glucose 65 - 99 mg/dL 110(H) 117(H) 114(H)  BUN 8 - 27 mg/dL 13 11 12   Creatinine 0.76 - 1.27 mg/dL 0.74(L) 0.73(L) 0.78  Sodium 134 - 144 mmol/L 142 139 140    Potassium 3.5 - 5.2 mmol/L 4.6 4.5 4.2  Chloride 96 - 106 mmol/L 107(H) 101 102  CO2 20 - 29 mmol/L 22 24 23   Calcium 8.6 - 10.2 mg/dL 9.5 9.4 9.1  Total Protein 6.0 - 8.5 g/dL 6.7 6.7 6.4  Total Bilirubin 0.0 - 1.2 mg/dL 0.9 1.0 0.6  Alkaline Phos 39 - 117 IU/L 76 80 69  AST 0 - 40 IU/L 24 19 24   ALT 0 - 44 IU/L 29 25 27    CBC Latest Ref Rng & Units 09/20/2018 03/18/2018 10/20/2017  WBC 3.4 - 10.8 x10E3/uL 7.3 7.1 7.8  Hemoglobin 13.0 - 17.7 g/dL 13.9 13.8 13.6(A)  Hematocrit 37.5 - 51.0 % 42.2 40.5 40.5(A)  Platelets 150 - 450 x10E3/uL 204 185 -   Lipid Panel     Component Value Date/Time   CHOL 123 09/23/2019 0946   TRIG 93 09/23/2019 0946   HDL 40 09/23/2019 0946   CHOLHDL 3.1 09/23/2019 0946   CHOLHDL 3.6 10/31/2015 0843   VLDL 25 10/31/2015 0843   LDLCALC 65 09/23/2019 0946   HEMOGLOBIN A1C Lab Results  Component Value Date   HGBA1C 5.7 (H) 09/20/2018   MPG 120 (H) 01/02/2016  "  TSH No results for input(s): TSH in the last 8760 hours.  Medications and allergies  No Known Allergies   Current Outpatient Medications  Medication Instructions  . atorvastatin (LIPITOR) 40 mg, Oral, Daily  . losartan-hydrochlorothiazide (HYZAAR) 50-12.5 MG tablet 1 tablet, Oral,  Every morning - 10a  . Multiple Vitamin (MULTIVITAMIN) tablet 1 tablet, Daily  . niacin (NIASPAN) 1000 MG CR tablet Take 1 tablet by mouth once daily as directed with a low fat snack and aspirin 30 minutes prior to taking medication  . verapamil (VERELAN PM) 240 mg, Oral, Daily at bedtime  . vitamin C (ASCORBIC ACID) 500 mg, Daily  . XARELTO 20 MG TABS tablet 20 mg.     Radiology:   CT Scan of Chest [08/24/2013]: Normal lung fields. Coronary artery calcification present primarily in the distribution of the LAD.  Cardiac Studies:   Sleep study 12/20/2014: Moderate to severe obstructive sleep apnea, successfully treated with CPAP. Follows Dr. Rexene Alberts.  Echocardiogram  [11/21/2014]: Poor parasternal views  due to bodily habitus. Left ventricle cavity is normal in size. Moderate concentric hypertrophy of the left ventricle. Grossly normal global wall motion. Unable to evaluate diastolic function due to A. Fibrillation. Calculated EF 55%. Left atrial cavity is severely dilated. Right atrial cavity is  moderate to severely dilated. Right ventricle cavity is borderline dilated. Normal right ventricular function. IVC is dilated with respiratory variation. This suggests elevated right heart pressure.  Nuclear stress test  [11/20/2014]: 1. The resting electrocardiogram demonstrated atrial fibrillation and incomplete RBBB. The stress electrocardiogram was non-diagnostic due to pharmacological stress testing with Lexican. The stress test was terminated because of dyspnea and end of protocol. 2. SPECT images demonstrate homogeneous tracer distribution throughout the myocardium. The left ventricular ejection fraction was calculated or visually estimated to be 38%. The estimation of the EF could be an error due to A. fibrillation. Clinical correlation recommended, this is a low risk scan.  EKG 01/20/2020: Atrial fibrillation with controlled ventricular response at the rate of 83 bpm, normal axis, no evidence of ischemia.  Low-voltage complexes.  ABNORMAL compared to 01/20/2019, heart rate is much better controlled.   Assessment     ICD-10-CM   1. Permanent atrial fibrillation ([place]). CHA2DS2-VASc Score is 3.  Yearly risk of stroke: 3.2% (A, HTN, Vasc Dz).   I48.21 EKG 12-Lead    PCV ECHOCARDIOGRAM COMPLETE  2. Essential hypertension  I10 losartan-hydrochlorothiazide (HYZAAR) 50-12.5 MG tablet  3. Obstructive sleep apnea on CPAP  G47.33    Z99.89   4. Hypercholesteremia  E78.00   5. Dyspnea on exertion  R06.00 PCV ECHOCARDIOGRAM COMPLETE     Meds ordered this encounter  Medications  . losartan-hydrochlorothiazide (HYZAAR) 50-12.5 MG tablet    Sig: Take 1 tablet by mouth every morning.    Dispense:  90 tablet     Refill:  3    There are no discontinued medications.   Recommendations:   Edward Watson  is a 74 y.o. male  with chroninc atrial fibrillation and asymptomatic from this regard. He presents today for 12 month follow-up. He is tolerating Xarelto well without bleeding diathesis.  He reports that he is still 100% compliant with CPAP. He quit smoking Dec 2016 and  has remained abstinent from tobacco use since that time, 50 pack year history.  He is presently doing well, dyspnea has remained stable, he has new bilateral ankle edema.  Previously he has had mild RV dilatation.  I will obtain repeat echocardiogram as it has been several years since last echocardiogram.  Weight loss was discussed extensively with the patient.  He appears motivated.  With regard to hypertension, added losartan HCT 50/12.5 mg in the morning, BMP in 2 weeks.  I will also obtain a TSH.  External labs reviewed, lipids in excellent control, renal function and liver function normal.  Continue anticoagulation, CBC is normal as well.  No bleeding diathesis.  Office visit in 6 months or sooner if problems.  Patient will let me know if systolic blood pressure is >130 mmHg which is the target.      [name], MD, [facility] 01/20/2020, 9:58 AM [facility]. [place] Office: [phone]

## 2020-02-06 DIAGNOSIS — G4733 Obstructive sleep apnea (adult) (pediatric): Secondary | ICD-10-CM | POA: Diagnosis not present

## 2020-02-10 DIAGNOSIS — I10 Essential (primary) hypertension: Secondary | ICD-10-CM | POA: Diagnosis not present

## 2020-02-11 LAB — BASIC METABOLIC PANEL
BUN/Creatinine Ratio: 13 (ref 10–24)
BUN: 10 mg/dL (ref 8–27)
CO2: 26 mmol/L (ref 20–29)
Calcium: 9.9 mg/dL (ref 8.6–10.2)
Chloride: 100 mmol/L (ref 96–106)
Creatinine, Ser: 0.8 mg/dL (ref 0.76–1.27)
GFR calc Af Amer: 104 mL/min/{1.73_m2} (ref >59)
GFR calc non Af Amer: 90 mL/min/{1.73_m2} (ref >59)
Glucose: 112 mg/dL — ABNORMAL HIGH (ref 65–99)
Potassium: 5.2 mmol/L (ref 3.5–5.2)
Sodium: 139 mmol/L (ref 134–144)

## 2020-02-11 LAB — TSH: TSH: 2.91 u[IU]/mL (ref 0.450–4.500)

## 2020-02-24 ENCOUNTER — Other Ambulatory Visit: Payer: Self-pay

## 2020-02-24 MED ORDER — XARELTO 20 MG PO TABS: ORAL_TABLET | ORAL | 1 refills | Status: DC

## 2020-04-03 ENCOUNTER — Other Ambulatory Visit: Payer: Self-pay | Admitting: Family Medicine

## 2020-04-03 DIAGNOSIS — I4891 Unspecified atrial fibrillation: Secondary | ICD-10-CM

## 2020-04-04 ENCOUNTER — Other Ambulatory Visit: Payer: Self-pay | Admitting: *Deleted

## 2020-05-22 ENCOUNTER — Other Ambulatory Visit: Payer: Self-pay | Admitting: Family Medicine

## 2020-05-22 DIAGNOSIS — E785 Hyperlipidemia, unspecified: Secondary | ICD-10-CM

## 2020-05-22 MED ORDER — NIACIN ER (ANTIHYPERLIPIDEMIC) 1000 MG PO TBCR: EXTENDED_RELEASE_TABLET | ORAL | 0 refills | Status: DC

## 2020-05-22 NOTE — Telephone Encounter (Signed)
Medication Refill - Medication: niacin (NIASPAN) 1000 MG CR tablet (Patient is requesting 10 tablets be sent to his local pharmacy as he awaits the mail order for his medication that is running behind. )   Has the patient contacted their pharmacy?yes (Agent: If no, request that the patient contact the pharmacy for the refill.) (Agent: If yes, when and what did the pharmacy advise?)contact pcp  Preferred Pharmacy (with phone number or street name):  [facility] [place], [place] Phone:  [phone]  Fax:  [phone]       Agent: Please be advised that RX refills may take up to 3 business days. We ask that you follow-up with your pharmacy.

## 2020-05-28 ENCOUNTER — Encounter: Payer: Self-pay | Admitting: Family Medicine

## 2020-05-28 ENCOUNTER — Other Ambulatory Visit: Payer: Self-pay

## 2020-05-28 ENCOUNTER — Ambulatory Visit (INDEPENDENT_AMBULATORY_CARE_PROVIDER_SITE_OTHER): Payer: PPO | Admitting: Family Medicine

## 2020-05-28 VITALS — BP 129/85 | HR 88 | Temp 98.3°F | Ht 73.0 in | Wt 288.0 lb

## 2020-05-28 DIAGNOSIS — I4891 Unspecified atrial fibrillation: Secondary | ICD-10-CM

## 2020-05-28 DIAGNOSIS — I10 Essential (primary) hypertension: Secondary | ICD-10-CM | POA: Diagnosis not present

## 2020-05-28 DIAGNOSIS — E785 Hyperlipidemia, unspecified: Secondary | ICD-10-CM | POA: Diagnosis not present

## 2020-05-28 DIAGNOSIS — R739 Hyperglycemia, unspecified: Secondary | ICD-10-CM | POA: Diagnosis not present

## 2020-05-28 MED ORDER — NIACIN ER (ANTIHYPERLIPIDEMIC) 1000 MG PO TBCR: EXTENDED_RELEASE_TABLET | ORAL | 2 refills | Status: DC

## 2020-05-28 MED ORDER — ATORVASTATIN CALCIUM 40 MG PO TABS: 40.0000 mg | ORAL_TABLET | Freq: Every day | ORAL | 2 refills | Status: DC

## 2020-05-28 MED ORDER — VERAPAMIL HCL ER 240 MG PO CP24: 240.0000 mg | ORAL_CAPSULE | Freq: Every day | ORAL | 2 refills | Status: DC

## 2020-05-28 NOTE — Patient Instructions (Signed)
° ° ° °  If you have lab work done today you will be contacted with your lab results within the next 2 weeks.  If you have not heard from us then please contact us. The fastest way to get your results is to register for My Chart. ° ° °IF you received an x-ray today, you will receive an invoice from [facility]. Please contact [facility] at [phone] with questions or concerns regarding your invoice.  ° °IF you received labwork today, you will receive an invoice from [facility]. Please contact [facility] at [phone] with questions or concerns regarding your invoice.  ° °Our billing staff will not be able to assist you with questions regarding bills from these companies. ° °You will be contacted with the lab results as soon as they are available. The fastest way to get your results is to activate your My Chart account. Instructions are located on the last page of this paperwork. If you have not heard from us regarding the results in 2 weeks, please contact this office. °  ° ° ° °
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
## 2020-05-29 ENCOUNTER — Encounter: Payer: Self-pay | Admitting: Family Medicine

## 2020-05-29 LAB — LIPID PANEL
Chol/HDL Ratio: 2.9 ratio (ref 0.0–5.0)
Cholesterol, Total: 112 mg/dL (ref 100–199)
HDL: 38 mg/dL — ABNORMAL LOW (ref >39)
LDL Chol Calc (NIH): 53 mg/dL (ref 0–99)
Triglycerides: 112 mg/dL (ref 0–149)
VLDL Cholesterol Cal: 21 mg/dL (ref 5–40)

## 2020-05-29 LAB — COMPREHENSIVE METABOLIC PANEL
ALT: 26 IU/L (ref 0–44)
AST: 23 IU/L (ref 0–40)
Albumin/Globulin Ratio: 1.8 (ref 1.2–2.2)
Albumin: 4.3 g/dL (ref 3.7–4.7)
Alkaline Phosphatase: 74 IU/L (ref 48–121)
BUN/Creatinine Ratio: 14 (ref 10–24)
BUN: 10 mg/dL (ref 8–27)
Bilirubin Total: 0.7 mg/dL (ref 0.0–1.2)
CO2: 25 mmol/L (ref 20–29)
Calcium: 9.4 mg/dL (ref 8.6–10.2)
Chloride: 106 mmol/L (ref 96–106)
Creatinine, Ser: 0.74 mg/dL — ABNORMAL LOW (ref 0.76–1.27)
GFR calc Af Amer: 107 mL/min/{1.73_m2} (ref >59)
GFR calc non Af Amer: 93 mL/min/{1.73_m2} (ref >59)
Globulin, Total: 2.4 g/dL (ref 1.5–4.5)
Glucose: 91 mg/dL (ref 65–99)
Potassium: 4.4 mmol/L (ref 3.5–5.2)
Sodium: 143 mmol/L (ref 134–144)
Total Protein: 6.7 g/dL (ref 6.0–8.5)

## 2020-05-29 LAB — HEMOGLOBIN A1C
Est. average glucose Bld gHb Est-mCnc: 114 mg/dL
Hgb A1c MFr Bld: 5.6 % (ref 4.8–5.6)

## 2020-06-20 ENCOUNTER — Other Ambulatory Visit: Payer: Self-pay

## 2020-06-20 ENCOUNTER — Ambulatory Visit: Payer: PPO

## 2020-06-20 DIAGNOSIS — R0609 Other forms of dyspnea: Secondary | ICD-10-CM

## 2020-06-20 DIAGNOSIS — R0600 Dyspnea, unspecified: Secondary | ICD-10-CM | POA: Diagnosis not present

## 2020-06-20 DIAGNOSIS — I4821 Permanent atrial fibrillation: Secondary | ICD-10-CM | POA: Diagnosis not present

## 2020-06-22 ENCOUNTER — Other Ambulatory Visit: Payer: PPO

## 2020-07-20 ENCOUNTER — Ambulatory Visit: Payer: PPO | Admitting: Cardiology

## 2020-07-30 ENCOUNTER — Ambulatory Visit: Payer: PPO | Admitting: Cardiology

## 2020-08-23 ENCOUNTER — Encounter: Payer: Self-pay | Admitting: Cardiology

## 2020-08-23 ENCOUNTER — Other Ambulatory Visit: Payer: Self-pay

## 2020-08-23 ENCOUNTER — Ambulatory Visit: Payer: PPO | Admitting: Cardiology

## 2020-08-23 VITALS — BP 124/76 | HR 85 | Resp 15 | Ht 73.0 in | Wt 278.0 lb

## 2020-08-23 DIAGNOSIS — I4821 Permanent atrial fibrillation: Secondary | ICD-10-CM

## 2020-08-23 DIAGNOSIS — E669 Obesity, unspecified: Secondary | ICD-10-CM | POA: Diagnosis not present

## 2020-08-23 DIAGNOSIS — Z9989 Dependence on other enabling machines and devices: Secondary | ICD-10-CM | POA: Diagnosis not present

## 2020-08-23 DIAGNOSIS — G4733 Obstructive sleep apnea (adult) (pediatric): Secondary | ICD-10-CM

## 2020-08-23 DIAGNOSIS — E7800 Pure hypercholesterolemia, unspecified: Secondary | ICD-10-CM

## 2020-08-23 DIAGNOSIS — I10 Essential (primary) hypertension: Secondary | ICD-10-CM

## 2020-08-23 NOTE — Progress Notes (Signed)
Primary Physician/Referring:  [name], MD  [name], male    DOB: [birth year], 74 y.o.   MRN: [id number]  Chief Complaint  Patient presents with  . Follow-up    6 month  . Hypertension   [name]  is a 74 y.o. male with chroninc atrial fibrillation anticoagulated on Xarelto. OSA compliant with CPAP, tobacco use disorder quit in Dec 2016 with 50 pack year history, hypertension, and hyperlipidemia.  The patient presents for 6 month follow up for atrial fibrillation. He is presently doing well and remains asymptomatic. Denies chest pain, dyspnea, PND, orthopnea. Denies dizziness, syncope. He reports he has lost some weight. He also discontinued his losartan due to this causing some dizziness. He has been monitoring his blood pressure at home. Denies melena, hematochezia.  Past Medical History:  Diagnosis Date  . Atrial fibrillation ([place])   . Coronary artery calcification   . Dyslipidemia   . Erectile dysfunction   . Hyperlipidemia   . Hypertension   . Obesity   . OSA (obstructive sleep apnea)   . Tobacco user 06/22/2013   Pt quit smoking October 2014.    Past Surgical History:  Procedure Laterality Date  . CARDIOVERSION N/A 03/20/2015   Procedure: CARDIOVERSION;  Surgeon: [name], MD;  Location: [facility] ENDOSCOPY;  Service: Cardiovascular;  Laterality: N/A;  . TONSILLECTOMY     age 69  . VASECTOMY     Family History  Problem Relation Age of Onset  . Colon cancer Paternal Grandmother   . Cancer Mother        kind unknown    Social History   Tobacco Use  . Smoking status: Former Smoker    Packs/day: 1.00    Years: 50.00    Pack years: 50.00    Quit date: 08/26/2013    Years since quitting: 6.9  . Smokeless tobacco: Never Used  . Tobacco comment: 0 cigarettes for 3 weeks  Substance Use Topics  . Alcohol use: Yes    Alcohol/week: 1.0 standard drink    Types: 1 Standard drinks or equivalent per week    Comment: rare   ROS    Review of Systems  Constitutional: Negative for malaise/fatigue.  Cardiovascular: Positive for leg swelling. Negative for chest pain, near-syncope and palpitations.  Respiratory: Positive for snoring (on CPAP). Negative for shortness of breath.   Gastrointestinal: Negative for melena.   Objective  Blood pressure 124/76, pulse 85, resp. rate 15, height 6\' 1"  (1.854 m), weight 278 lb (126.1 kg), SpO2 96 %.  Vitals with BMI 08/23/2020 05/28/2020 01/20/2020  Height 6\' 1"  6\' 1"  6\' 1"   Weight 278 lbs 288 lbs 293 lbs 5 oz  BMI 36.69 38.46 65.9  Systolic 935 701 779  Diastolic 76 85 85  Pulse 85 88 73     Physical Exam HENT:     Head: Atraumatic.  Cardiovascular:     Rate and Rhythm: Rhythm irregularly irregular.     Pulses:          Carotid pulses are 2+ on the right side and 2+ on the left side.      Femoral pulses are 2+ on the right side and 2+ on the left side.      Dorsalis pedis pulses are 2+ on the right side and 0 on the left side.       Posterior tibial pulses are 0 on the right side and 2+ on the left side.  Heart sounds: No murmur heard.  No gallop. No S3 or S4 sounds.      Comments: S1 is variable, S2 is normal. No JVD, 2+ ankle edema. Pulmonary:     Effort: Pulmonary effort is normal.     Breath sounds: Normal breath sounds.  Abdominal:     General: Bowel sounds are normal.     Palpations: Abdomen is soft.    Laboratory examination:   Recent Labs    09/23/19 0946 02/10/20 0806 05/28/20 1543  NA 142 139 143  K 4.6 5.2 4.4  CL 107* 100 106  CO2 22 26 25   GLUCOSE 110* 112* 91  BUN 13 10 10   CREATININE 0.74* 0.80 0.74*  CALCIUM 9.5 9.9 9.4  GFRNONAA 93 90 93  GFRAA 107 104 107   CrCl cannot be calculated (Patient's most recent lab result is older than the maximum 21 days allowed.).  CMP Latest Ref Rng & Units 05/28/2020 02/10/2020 09/23/2019  Glucose 65 - 99 mg/dL 91 112(H) 110(H)  BUN 8 - 27 mg/dL 10 10 13   Creatinine 0.76 - 1.27 mg/dL 0.74(L) 0.80 0.74(L)   Sodium 134 - 144 mmol/L 143 139 142  Potassium 3.5 - 5.2 mmol/L 4.4 5.2 4.6  Chloride 96 - 106 mmol/L 106 100 107(H)  CO2 20 - 29 mmol/L 25 26 22   Calcium 8.6 - 10.2 mg/dL 9.4 9.9 9.5  Total Protein 6.0 - 8.5 g/dL 6.7 - 6.7  Total Bilirubin 0.0 - 1.2 mg/dL 0.7 - 0.9  Alkaline Phos 48 - 121 IU/L 74 - 76  AST 0 - 40 IU/L 23 - 24  ALT 0 - 44 IU/L 26 - 29   CBC Latest Ref Rng & Units 09/20/2018 03/18/2018 10/20/2017  WBC 3.4 - 10.8 x10E3/uL 7.3 7.1 7.8  Hemoglobin 13.0 - 17.7 g/dL 13.9 13.8 13.6(A)  Hematocrit 37.5 - 51.0 % 42.2 40.5 40.5(A)  Platelets 150 - 450 x10E3/uL 204 185 -   Lipid Panel Recent Labs    09/23/19 0946 05/28/20 1543  CHOL 123 112  TRIG 93 112  LDLCALC 65 53  HDL 40 38*  CHOLHDL 3.1 2.9    HEMOGLOBIN A1C Lab Results  Component Value Date   HGBA1C 5.6 05/28/2020   MPG 120 (H) 01/02/2016   TSH Recent Labs    02/10/20 0806  TSH 2.910   Medications and allergies  No Known Allergies   Current Outpatient Medications on File Prior to Visit  Medication Sig Dispense Refill  . atorvastatin (LIPITOR) 40 MG tablet Take 1 tablet (40 mg total) by mouth daily. 90 tablet 2  . Multiple Vitamin (MULTIVITAMIN) tablet Take 1 tablet by mouth daily.    . niacin (NIASPAN) 1000 MG CR tablet Take 1 tablet by mouth once daily as directed with a low fat snack and aspirin 30 minutes prior to taking medication 90 tablet 2  . verapamil (VERELAN PM) 240 MG 24 hr capsule Take 1 capsule (240 mg total) by mouth at bedtime. 90 capsule 2  . vitamin C (ASCORBIC ACID) 500 MG tablet Take 500 mg by mouth daily.    Alveda Reasons 20 MG TABS tablet 20 mg. 90 tablet 1   No current facility-administered medications on file prior to visit.    Radiology:   CT Scan of Chest [08/24/2013]: Normal lung fields. Coronary artery calcification present primarily in the distribution of the LAD.  Cardiac Studies:   Sleep study 12/20/2014: Moderate to severe obstructive sleep apnea, successfully treated  with CPAP. Follows Dr.  Athar.  Nuclear stress test  [11/20/2014]: 1. The resting electrocardiogram demonstrated atrial fibrillation and incomplete RBBB. The stress electrocardiogram was non-diagnostic due to pharmacological stress testing with Lexican. The stress test was terminated because of dyspnea and end of protocol. 2. SPECT images demonstrate homogeneous tracer distribution throughout the myocardium. The left ventricular ejection fraction was calculated or visually estimated to be 38%. The estimation of the EF could be an error due to A. fibrillation. Clinical correlation recommended, this is a low risk scan.  Echocardiogram 06/20/2020: Normal LV systolic function with visual EF 55-60%. Left ventricle cavity is normal in size. Moderate left ventricular hypertrophy. Normal global wall motion. Unable to evaluate diastolic function due to atrial fibrillation. Elevated LAP. Calculated EF 59%. Left atrial cavity is severely dilated. Grossly right atrial size is moderately dilated. Mild (Grade I) aortic regurgitation. Aortic sclerosis without stenosis. Mild tricuspid regurgitation. Mild pulmonary hypertension. RVSP measures 38 mmHg. Insignificant pericardial effusion. There is no hemodynamic significance. The aortic root is dilated, sinus tubular junction 3.8cm IVC is dilated with a respiratory response of <50%. Compared to prior study dated 11/21/2014: Mild AR, Mild TR, mild PHTN, and aortic dilation are new findings.   EKG:   EKG 08/23/2020: Atrial fibrillation with controlled ventricular spots at rate of 78 bpm, normal axis, poor R wave progression, probably normal variant.  No evidence of ischemia, normal QT interval.  EKG 01/20/2020: Atrial fibrillation with controlled ventricular response at the rate of 83 bpm, normal axis, no evidence of ischemia.  Low-voltage complexes.  ABNORMAL compared to 01/20/2019, heart rate is much better controlled.   Assessment     ICD-10-CM   1. Permanent  atrial fibrillation (HCC)  I48.21 EKG 12-Lead    CBC  2. Essential hypertension  I10   3. Hypercholesteremia  E78.00   4. Obstructive sleep apnea on CPAP  G47.33    Z99.89   5. Obesity (BMI 35.0-39.9 without comorbidity)  E66.9      No orders of the defined types were placed in this encounter.   There are no discontinued medications.   Recommendations:   Edward Watson  is a 74 y.o. male  male with chroninc atrial fibrillation anticoagulated on Xarelto. OSA compliant with CPAP, tobacco use disorder quit in Dec 2016 with 50 pack year history, hypertension, and hyperlipidemia.   The patient presents for six month follow up for chronic atrial fibrillation. He is presently doing well and remains asymptomatic. His echocardiogram was reviewed showing mild MR, mild TR, mild PHTN, and aortic dilation which are new compared with previous study. He is tolerating Xarelto without bleeding diathesis. He remains compliant with CPAP. He continues to have bilateral ankle edema which is stable.   His blood pressure is well controlled. He discontinued losartan as it was causing him to feel dizzy. He has lost 15 lbs since his last visit six months ago. I suspect his weight loss contributed to the improvement in his blood pressure. He is encouraged to continue working on weight loss and is .   His labs were reviewed. Renal and liver function are normal. TSH within normal limits. A1C is normal. Lipids are well controlled. He has not had a CBC in more than a year so I will recheck this given long term anticoagulation.  We will see him back in one year for follow up.  Blair Heys, PA Student 08/23/20  11:13 AM   Patient seen and examined in conjunction with Blair Heys, PA second year student at [employer].  Time spent is in direct patient face to face encounter not including the teaching and training involved.    [name], MD, [facility] 08/23/2020, 11:13 AM Office: [phone]

## 2020-08-24 LAB — CBC
Hematocrit: 42.1 % (ref 37.5–51.0)
Hemoglobin: 14.2 g/dL (ref 13.0–17.7)
MCH: 30.7 pg (ref 26.6–33.0)
MCHC: 33.7 g/dL (ref 31.5–35.7)
MCV: 91 fL (ref 79–97)
Platelets: 194 10*3/uL (ref 150–450)
RBC: 4.62 x10E6/uL (ref 4.14–5.80)
RDW: 12.4 % (ref 11.6–15.4)
WBC: 6.3 10*3/uL (ref 3.4–10.8)

## 2020-09-28 ENCOUNTER — Ambulatory Visit (INDEPENDENT_AMBULATORY_CARE_PROVIDER_SITE_OTHER): Payer: PPO | Admitting: Family Medicine

## 2020-09-28 ENCOUNTER — Other Ambulatory Visit: Payer: Self-pay

## 2020-09-28 ENCOUNTER — Encounter: Payer: Self-pay | Admitting: Family Medicine

## 2020-09-28 VITALS — BP 138/73 | HR 82 | Temp 98.3°F | Ht 73.0 in | Wt 274.0 lb

## 2020-09-28 DIAGNOSIS — I10 Essential (primary) hypertension: Secondary | ICD-10-CM | POA: Diagnosis not present

## 2020-09-28 DIAGNOSIS — Z1211 Encounter for screening for malignant neoplasm of colon: Secondary | ICD-10-CM | POA: Diagnosis not present

## 2020-09-28 DIAGNOSIS — G4733 Obstructive sleep apnea (adult) (pediatric): Secondary | ICD-10-CM

## 2020-09-28 DIAGNOSIS — Z0000 Encounter for general adult medical examination without abnormal findings: Secondary | ICD-10-CM

## 2020-09-28 DIAGNOSIS — E785 Hyperlipidemia, unspecified: Secondary | ICD-10-CM

## 2020-09-28 DIAGNOSIS — Z0001 Encounter for general adult medical examination with abnormal findings: Secondary | ICD-10-CM | POA: Diagnosis not present

## 2020-09-28 DIAGNOSIS — I4891 Unspecified atrial fibrillation: Secondary | ICD-10-CM

## 2020-09-28 DIAGNOSIS — Z9989 Dependence on other enabling machines and devices: Secondary | ICD-10-CM

## 2020-09-28 NOTE — Patient Instructions (Addendum)
I will refer you for updated colonoscopy. No med changes today. Thanks for coming in and take care.    Preventive Care 23 Years and Older, Male Preventive care refers to lifestyle choices and visits with your health care provider that can promote health and wellness. This includes:  A yearly physical exam. This is also called an annual well check.  Regular dental and eye exams.  Immunizations.  Screening for certain conditions.  Healthy lifestyle choices, such as diet and exercise. What can I expect for my preventive care visit? Physical exam Your health care provider will check:  Height and weight. These may be used to calculate body mass index (BMI), which is a measurement that tells if you are at a healthy weight.  Heart rate and blood pressure.  Your skin for abnormal spots. Counseling Your health care provider may ask you questions about:  Alcohol, tobacco, and drug use.  Emotional well-being.  Home and relationship well-being.  Sexual activity.  Eating habits.  History of falls.  Memory and ability to understand (cognition).  Work and work Statistician. What immunizations do I need?  Influenza (flu) vaccine  This is recommended every year. Tetanus, diphtheria, and pertussis (Tdap) vaccine  You may need a Td booster every 10 years. Varicella (chickenpox) vaccine  You may need this vaccine if you have not already been vaccinated. Zoster (shingles) vaccine  You may need this after age 72. Pneumococcal conjugate (PCV13) vaccine  One dose is recommended after age 72. Pneumococcal polysaccharide (PPSV23) vaccine  One dose is recommended after age 72. Measles, mumps, and rubella (MMR) vaccine  You may need at least one dose of MMR if you were born in 1957 or later. You may also need a second dose. Meningococcal conjugate (MenACWY) vaccine  You may need this if you have certain conditions. Hepatitis A vaccine  You may need this if you have certain  conditions or if you travel or work in places where you may be exposed to hepatitis A. Hepatitis B vaccine  You may need this if you have certain conditions or if you travel or work in places where you may be exposed to hepatitis B. Haemophilus influenzae type b (Hib) vaccine  You may need this if you have certain conditions. You may receive vaccines as individual doses or as more than one vaccine together in one shot (combination vaccines). Talk with your health care provider about the risks and benefits of combination vaccines. What tests do I need? Blood tests  Lipid and cholesterol levels. These may be checked every 5 years, or more frequently depending on your overall health.  Hepatitis C test.  Hepatitis B test. Screening  Lung cancer screening. You may have this screening every year starting at age 72 if you have a 30-pack-year history of smoking and currently smoke or have quit within the past 15 years.  Colorectal cancer screening. All adults should have this screening starting at age 46 and continuing until age 62. Your health care provider may recommend screening at age 48 if you are at increased risk. You will have tests every 1-10 years, depending on your results and the type of screening test.  Prostate cancer screening. Recommendations will vary depending on your family history and other risks.  Diabetes screening. This is done by checking your blood sugar (glucose) after you have not eaten for a while (fasting). You may have this done every 1-3 years.  Abdominal aortic aneurysm (AAA) screening. You may need this if you are  a current or former smoker.  Sexually transmitted disease (STD) testing. Follow these instructions at home: Eating and drinking  Eat a diet that includes fresh fruits and vegetables, whole grains, lean protein, and low-fat dairy products. Limit your intake of foods with high amounts of sugar, saturated fats, and salt.  Take vitamin and mineral  supplements as recommended by your health care provider.  Do not drink alcohol if your health care provider tells you not to drink.  If you drink alcohol: ? Limit how much you have to 0-2 drinks a day. ? Be aware of how much alcohol is in your drink. In the [place] one drink equals one 12 oz bottle of beer (355 mL), one 5 oz glass of wine (148 mL), or one 1 oz glass of hard liquor (44 mL). Lifestyle  Take daily care of your teeth and gums.  Stay active. Exercise for at least 30 minutes on 5 or more days each week.  Do not use any products that contain nicotine or tobacco, such as cigarettes, e-cigarettes, and chewing tobacco. If you need help quitting, ask your health care provider.  If you are sexually active, practice safe sex. Use a condom or other form of protection to prevent STIs (sexually transmitted infections).  Talk with your health care provider about taking a low-dose aspirin or statin. What's next?  Visit your health care provider once a year for a well check visit.  Ask your health care provider how often you should have your eyes and teeth checked.  Stay up to date on all vaccines. This information is not intended to replace advice given to you by your health care provider. Make sure you discuss any questions you have with your health care provider. Document Revised: 10/28/2018 Document Reviewed: 10/28/2018 Elsevier Patient Education  [employer].    If you have lab work done today you will be contacted with your lab results within the next 2 weeks.  If you have not heard from [place] then please contact us. The fastest way to get your results is to register for My Chart.   IF you received an x-ray today, you will receive an invoice from [facility]. Please contact [facility] at [phone] with questions or concerns regarding your invoice.   IF you received labwork today, you will receive an invoice from [facility]. Please contact [facility] at  [phone] with questions or concerns regarding your invoice.   Our billing staff will not be able to assist you with questions regarding bills from these companies.  You will be contacted with the lab results as soon as they are available. The fastest way to get your results is to activate your My Chart account. Instructions are located on the last page of this paperwork. If you have not heard from [place] regarding the results in 2 weeks, please contact this office.
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
## 2020-10-04 ENCOUNTER — Telehealth: Payer: Self-pay

## 2020-10-04 ENCOUNTER — Other Ambulatory Visit: Payer: Self-pay

## 2020-10-04 MED ORDER — XARELTO 20 MG PO TABS: ORAL_TABLET | ORAL | 0 refills | Status: DC

## 2020-10-04 MED ORDER — XARELTO 20 MG PO TABS: ORAL_TABLET | ORAL | 1 refills | Status: DC

## 2020-10-04 NOTE — Telephone Encounter (Signed)
[facility] called for clarification for Xarelto 20mg . Is the patient supposed to be taking it once a daily or twice a day? Please advise.

## 2020-10-05 ENCOUNTER — Other Ambulatory Visit: Payer: Self-pay

## 2020-10-05 NOTE — Telephone Encounter (Signed)
Sent in full script to [facility], however patient was advised there would be a delay in receiving the medication. Patient requested small script be sent in to local pharmacy so he will not go without.

## 2020-10-05 NOTE — Telephone Encounter (Signed)
Clarified medication and instructions with pharmacy. Verbal order performed. Patient will come into office to get samples to last until he receives medication from mail order.

## 2020-10-05 NOTE — Telephone Encounter (Signed)
Edward Watson, I see you ordered 10 tablets for refill. Dont understand. One tablet daily at supper

## 2020-10-09 ENCOUNTER — Other Ambulatory Visit: Payer: Self-pay

## 2020-10-09 MED ORDER — XARELTO 20 MG PO TABS
20.0000 mg | ORAL_TABLET | Freq: Every day | ORAL | 0 refills | Status: DC
Start: 2020-10-09 — End: 2021-03-07

## 2020-12-14 ENCOUNTER — Ambulatory Visit: Payer: PPO | Admitting: Gastroenterology

## 2020-12-14 ENCOUNTER — Telehealth: Payer: Self-pay

## 2020-12-14 ENCOUNTER — Encounter: Payer: Self-pay | Admitting: Gastroenterology

## 2020-12-14 VITALS — BP 112/72 | HR 71 | Ht 73.0 in | Wt 279.0 lb

## 2020-12-14 DIAGNOSIS — Z86010 Personal history of colonic polyps: Secondary | ICD-10-CM

## 2020-12-14 DIAGNOSIS — Z1211 Encounter for screening for malignant neoplasm of colon: Secondary | ICD-10-CM

## 2020-12-14 DIAGNOSIS — Z7901 Long term (current) use of anticoagulants: Secondary | ICD-10-CM | POA: Diagnosis not present
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
## 2020-12-14 NOTE — Telephone Encounter (Signed)
Request for surgical clearance:     Endoscopy Procedure  What type of surgery is being performed?  Colonoscopy  When is this surgery scheduled?     TBD  What type of clearance is required ?   Pharmacy  Are there any medications that need to be held prior to surgery and how long? Xarelto 2 days prior to procedure   Practice name and name of physician performing surgery?      [facility]-Dr.Mansouraty   What is your office phone and fax number?      Phone- [phone]  [phone]  Anesthesia type (None, local, MAC, general) ?       MAC

## 2020-12-14 NOTE — Patient Instructions (Signed)
It has been recommended to you by your physician that you have a(n) Colonoscopy  completed. Per your request, we did not schedule the procedure(s) today. Please contact our office at [phone] ask Britainy Kozub.   You will be contaced by our office prior to your procedure for directions on holding your Xarelto.  If you do not hear from our office 1 week prior to your scheduled procedure, please call [phone] to discuss.  If you are age 73 or older, your body mass index should be between 23-30. Your Body mass index is 36.81 kg/m. If this is out of the aforementioned range listed, please consider follow up with your Primary Care Provider.  If you are age 4 or younger, your body mass index should be between 19-25. Your Body mass index is 36.81 kg/m. If this is out of the aformentioned range listed, please consider follow up with your Primary Care Provider.    Thank you for choosing me and [facility].  Dr. Rush Landmark

## 2020-12-16 ENCOUNTER — Encounter: Payer: Self-pay | Admitting: Cardiology

## 2020-12-16 NOTE — Telephone Encounter (Signed)
I have seen the patient recently, he is stable from cardiac standpoint to undergo colonoscopy with low risk.  You can surely hold his Xarelto for 2 days and restart same day if no biopsy otherwise in 3 to 4 days if Dr. Rush Landmark feels it is safe to start.  ? [name], MD, [facility] 12/16/2020, 8:42 AM Office: [phone] Pager: [phone]  M: [phone]

## 2020-12-17 NOTE — Telephone Encounter (Signed)
FYI

## 2020-12-18 ENCOUNTER — Encounter: Payer: Self-pay | Admitting: Gastroenterology

## 2020-12-18 NOTE — Telephone Encounter (Signed)
Dr. Einar Gip, thank you for update. Rovonda this will be good for our hold of anticoagulation. We will get the patient back on anticoagulation as quickly as possible. GM

## 2020-12-21 NOTE — Telephone Encounter (Signed)
Left message on ans mach for pt.Called to see if he is ready to schedule Colonoscopy.

## 2020-12-28 ENCOUNTER — Encounter: Payer: Self-pay | Admitting: Gastroenterology

## 2020-12-28 NOTE — Telephone Encounter (Signed)
Pt returned called per Janett Billow. Pt has been scheduled for pre-visit in April. Per Dr. Rush Landmark -Needs 2 day prep , use Miralax 1 week prior to colonoscopy. Pt has also been cleared to hold Xarelto 2 days prior to procedure per Dr. Einar Gip.

## 2021-01-09 ENCOUNTER — Encounter: Payer: Self-pay | Admitting: Adult Health

## 2021-01-09 ENCOUNTER — Ambulatory Visit: Payer: PPO | Admitting: Adult Health

## 2021-01-09 VITALS — BP 147/81 | HR 69 | Ht 73.0 in | Wt 270.0 lb

## 2021-01-09 DIAGNOSIS — G4733 Obstructive sleep apnea (adult) (pediatric): Secondary | ICD-10-CM

## 2021-01-09 DIAGNOSIS — Z9989 Dependence on other enabling machines and devices: Secondary | ICD-10-CM

## 2021-01-09 NOTE — Progress Notes (Signed)
Cm sent to [facility]
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
## 2021-01-31 DIAGNOSIS — Z961 Presence of intraocular lens: Secondary | ICD-10-CM | POA: Diagnosis not present

## 2021-01-31 DIAGNOSIS — H26493 Other secondary cataract, bilateral: Secondary | ICD-10-CM | POA: Diagnosis not present

## 2021-01-31 DIAGNOSIS — H524 Presbyopia: Secondary | ICD-10-CM | POA: Diagnosis not present

## 2021-02-07 DIAGNOSIS — H26492 Other secondary cataract, left eye: Secondary | ICD-10-CM | POA: Diagnosis not present

## 2021-02-25 ENCOUNTER — Other Ambulatory Visit: Payer: Self-pay | Admitting: Family Medicine

## 2021-02-25 DIAGNOSIS — E785 Hyperlipidemia, unspecified: Secondary | ICD-10-CM

## 2021-02-25 NOTE — Telephone Encounter (Signed)
Requested medications are due for refill today.  yes  Requested medications are on the active medications list.  yes  Last refill. 05/28/2020  Future visit scheduled.   yes  Notes to clinic.

## 2021-02-26 ENCOUNTER — Other Ambulatory Visit: Payer: Self-pay

## 2021-02-26 ENCOUNTER — Encounter: Payer: Self-pay | Admitting: Family Medicine

## 2021-02-26 DIAGNOSIS — E785 Hyperlipidemia, unspecified: Secondary | ICD-10-CM

## 2021-02-26 MED ORDER — NIACIN ER (ANTIHYPERLIPIDEMIC) 1000 MG PO TBCR
EXTENDED_RELEASE_TABLET | ORAL | 2 refills | Status: DC
Start: 2021-02-26 — End: 2021-03-29

## 2021-03-05 ENCOUNTER — Encounter: Payer: PPO | Admitting: Gastroenterology

## 2021-03-07 ENCOUNTER — Other Ambulatory Visit: Payer: Self-pay

## 2021-03-07 ENCOUNTER — Ambulatory Visit (AMBULATORY_SURGERY_CENTER): Payer: PPO | Admitting: *Deleted

## 2021-03-07 VITALS — Ht 73.0 in | Wt 277.0 lb

## 2021-03-07 DIAGNOSIS — Z1211 Encounter for screening for malignant neoplasm of colon: Secondary | ICD-10-CM

## 2021-03-07 MED ORDER — XARELTO 20 MG PO TABS: 20.0000 mg | ORAL_TABLET | Freq: Every day | ORAL | 1 refills | Status: DC

## 2021-03-07 MED ORDER — PEG 3350-KCL-NA BICARB-NACL 420 G PO SOLR: 4000.0000 mL | Freq: Once | ORAL | 0 refills | Status: AC

## 2021-03-07 NOTE — Progress Notes (Signed)
Patient's pre-visit was done today over the phone with the patient due to COVID-19 pandemic. Name,DOB and address verified. Insurance verified. Patient denies any allergies to Eggs and Soy. Patient denies any problems with anesthesia/sedation. Patient denies taking diet pills,is on blood thinner. Packet of Prep instructions mailed to patient including a copy of a consent form-pt is aware.  Patient understands to call us back with any questions or concerns. The patient is COVID-19 fully vaccinated, per patient. Patient is aware of our care-partner policy and UDJSH-70 safety protocol. EMMI education assigned to the patient for the procedure, sent to [facility]. Patient denies any medical hx changes since last GI OV.

## 2021-03-19 ENCOUNTER — Encounter: Payer: Self-pay | Admitting: Gastroenterology

## 2021-03-19 ENCOUNTER — Ambulatory Visit (AMBULATORY_SURGERY_CENTER): Payer: PPO | Admitting: Gastroenterology

## 2021-03-19 ENCOUNTER — Other Ambulatory Visit: Payer: Self-pay

## 2021-03-19 VITALS — BP 118/76 | HR 89 | Temp 97.5°F | Resp 19 | Ht 73.0 in | Wt 277.0 lb

## 2021-03-19 DIAGNOSIS — Z86010 Personal history of colonic polyps: Secondary | ICD-10-CM | POA: Diagnosis not present

## 2021-03-19 DIAGNOSIS — Z1211 Encounter for screening for malignant neoplasm of colon: Secondary | ICD-10-CM | POA: Diagnosis not present

## 2021-03-19 DIAGNOSIS — D122 Benign neoplasm of ascending colon: Secondary | ICD-10-CM | POA: Diagnosis not present

## 2021-03-19 DIAGNOSIS — D120 Benign neoplasm of cecum: Secondary | ICD-10-CM | POA: Diagnosis not present

## 2021-03-19 DIAGNOSIS — D124 Benign neoplasm of descending colon: Secondary | ICD-10-CM | POA: Diagnosis not present

## 2021-03-19 DIAGNOSIS — D123 Benign neoplasm of transverse colon: Secondary | ICD-10-CM

## 2021-03-19 DIAGNOSIS — D127 Benign neoplasm of rectosigmoid junction: Secondary | ICD-10-CM | POA: Diagnosis not present

## 2021-03-19 DIAGNOSIS — D128 Benign neoplasm of rectum: Secondary | ICD-10-CM | POA: Diagnosis not present

## 2021-03-19 DIAGNOSIS — D125 Benign neoplasm of sigmoid colon: Secondary | ICD-10-CM

## 2021-03-19 HISTORY — PX: COLONOSCOPY: SHX174

## 2021-03-19 MED ORDER — SODIUM CHLORIDE 0.9 % IV SOLN: 500.0000 mL | Freq: Once | INTRAVENOUS | Status: DC

## 2021-03-19 NOTE — Progress Notes (Signed)
Called to room to assist during endoscopic procedure.  Patient ID and intended procedure confirmed with present staff. Received instructions for my participation in the procedure from the performing physician.  

## 2021-03-19 NOTE — Patient Instructions (Signed)
Handouts given:  High Fiber Diet, diverticulosis, Hemorrhoids, Polyps Start High fiber Diet Use Fibercon 1-2 tablets daily May restart Xarelto in 2 days on 5 May Await pathology results  YOU HAD AN ENDOSCOPIC PROCEDURE TODAY AT [place]:   Refer to the procedure report that was given to you for any specific questions about what was found during the examination.  If the procedure report does not answer your questions, please call your gastroenterologist to clarify.  If you requested that your care partner not be given the details of your procedure findings, then the procedure report has been included in a sealed envelope for you to review at your convenience later.  YOU SHOULD EXPECT: Some feelings of bloating in the abdomen. Passage of more gas than usual.  Walking can help get rid of the air that was put into your GI tract during the procedure and reduce the bloating. If you had a lower endoscopy (such as a colonoscopy or flexible sigmoidoscopy) you may notice spotting of blood in your stool or on the toilet paper. If you underwent a bowel prep for your procedure, you may not have a normal bowel movement for a few days.  Please Note:  You might notice some irritation and congestion in your nose or some drainage.  This is from the oxygen used during your procedure.  There is no need for concern and it should clear up in a day or so.  SYMPTOMS TO REPORT IMMEDIATELY:   Following lower endoscopy (colonoscopy or flexible sigmoidoscopy):  Excessive amounts of blood in the stool  Significant tenderness or worsening of abdominal pains  Swelling of the abdomen that is new, acute  Fever of 100F or higher  For urgent or emergent issues, a gastroenterologist can be reached at any hour by calling [phone]. Do not use MyChart messaging for urgent concerns.   DIET:  We do recommend a small meal at first, but then you may proceed to your regular diet.  Drink plenty of fluids but  you should avoid alcoholic beverages for 24 hours.  ACTIVITY:  You should plan to take it easy for the rest of today and you should NOT DRIVE or use heavy machinery until tomorrow (because of the sedation medicines used during the test).    FOLLOW UP: Our staff will call the number listed on your records 48-72 hours following your procedure to check on you and address any questions or concerns that you may have regarding the information given to you following your procedure. If we do not reach you, we will leave a message.  We will attempt to reach you two times.  During this call, we will ask if you have developed any symptoms of COVID 19. If you develop any symptoms (ie: fever, flu-like symptoms, shortness of breath, cough etc.) before then, please call [phone].  If you test positive for Covid 19 in the 2 weeks post procedure, please call and report this information to [place].    If any biopsies were taken you will be contacted by phone or by letter within the next 1-3 weeks.  Please call us at [phone] if you have not heard about the biopsies in 3 weeks.   SIGNATURES/CONFIDENTIALITY: You and/or your care partner have signed paperwork which will be entered into your electronic medical record.  These signatures attest to the fact that that the information above on your After Visit Summary has been reviewed and is understood.  Full responsibility of the confidentiality  of this discharge information lies with you and/or your care-partner.

## 2021-03-19 NOTE — Progress Notes (Signed)
Pt's states no medical or surgical changes since previsit or office visit.  Vitals per MO 

## 2021-03-19 NOTE — Progress Notes (Signed)
pt tolerated well. VSS. awake and to recovery. Report given to RN.  

## 2021-03-19 NOTE — Op Note (Signed)
[facility] Patient Name: Edward Watson Procedure Date: 03/19/2021 8:56 AM MRN: [id number] Endoscopist: Justice Britain , MD Age: 75 Referring MD:  Date of Birth: August 28, 1946 Gender: Male Account #: [id number] Procedure:                Colonoscopy Indications:              Screening for colorectal malignant neoplasm,                            Previous Poor Preparation in 2014 Medicines:                Monitored Anesthesia Care Procedure:                Pre-Anesthesia Assessment:                           - Prior to the procedure, a History and Physical                            was performed, and patient medications and                            allergies were reviewed. The patient's tolerance of                            previous anesthesia was also reviewed. The risks                            and benefits of the procedure and the sedation                            options and risks were discussed with the patient.                            All questions were answered, and informed consent                            was obtained. Prior Anticoagulants: The patient has                            taken Xarelto (rivaroxaban), last dose was 2 days                            prior to procedure. ASA Grade Assessment: III - A                            patient with severe systemic disease. After                            reviewing the risks and benefits, the patient was                            deemed in satisfactory condition to undergo the  procedure.                           After obtaining informed consent, the colonoscope                            was passed under direct vision. Throughout the                            procedure, the patient's blood pressure, pulse, and                            oxygen saturations were monitored continuously. The                            Olympus CF-HQ190 (#[id number]) Colonoscope was                             introduced through the anus and advanced to the the                            cecum, identified by palpation. The colonoscopy was                            performed without difficulty. The patient tolerated                            the procedure. The quality of the bowel preparation                            was adequate. The terminal ileum, ileocecal valve,                            appendiceal orifice, and rectum were photographed. Scope In: 9:05:53 AM Scope Out: 9:28:38 AM Scope Withdrawal Time: 0 hours 17 minutes 17 seconds  Total Procedure Duration: 0 hours 22 minutes 45 seconds  Findings:                 The digital rectal exam findings include                            hemorrhoids. Pertinent negatives include no                            palpable rectal lesions.                           A large amount of liquid semi-liquid stool was                            found in the entire colon, interfering with                            visualization. Lavage of the area was performed  using copious amounts, resulting in clearance with                            adequate visualization.                           The terminal ileum and ileocecal valve appeared                            normal.                           15, sessile polyps were found in the rectum (2),                            sigmoid colon (2), descending colon (2), transverse                            colon (3), ascending colon (3) and cecum (2). The                            polyps were 2 to 7 mm in size. These polyps were                            removed with a cold snare. Resection and retrieval                            were complete.                           Many small and large-mouthed diverticula were found                            in the entire colon.                           Normal mucosa was found in the entire colon                            otherwise.                            Non-bleeding non-thrombosed external and internal                            hemorrhoids were found during retroflexion, during                            perianal exam and during digital exam. The                            hemorrhoids were Grade II (internal hemorrhoids                            that prolapse but reduce spontaneously). Complications:  No immediate complications. Estimated Blood Loss:     Estimated blood loss was minimal. Impression:               - Hemorrhoids found on digital rectal exam.                           - Stool in the entire examined colon.                           - The examined portion of the ileum was normal.                           - 15, 2 to 7 mm polyps in the rectum, in the                            sigmoid colon, in the descending colon, in the                            transverse colon, in the ascending colon and in the                            cecum, removed with a cold snare. Resected and                            retrieved.                           - Diverticulosis in the entire examined colon.                           - Normal mucosa in the entire examined colon                            otherwise.                           - Non-bleeding non-thrombosed external and internal                            hemorrhoids. Recommendation:           - The patient will be observed post-procedure,                            until all discharge criteria are met.                           - Discharge patient to home.                           - Patient has a contact number available for                            emergencies. The signs and symptoms of potential  delayed complications were discussed with the                            patient. Return to normal activities tomorrow.                            Written discharge instructions were provided to the                             patient.                           - High fiber diet.                           - Use FiberCon 1-2 tablets PO daily.                           - May restart Xarelto in 48 hours (5/5 AM) to                            decrease risk of post-interventional bleeding.                           - Continue present medications otherwise.                           - Await pathology results.                           - Repeat colonoscopy in 3/5/7 years for                            surveillance based on pathology results. If >10                            polyps are adenomatous then will need a 1-year                            follow up colonoscopy.                           - The findings and recommendations were discussed                            with the patient.                           - The findings and recommendations were discussed                            with the patient's family. Justice Britain, MD 03/19/2021 9:39:19 AM

## 2021-03-21 ENCOUNTER — Telehealth: Payer: Self-pay | Admitting: *Deleted

## 2021-03-21 NOTE — Telephone Encounter (Signed)
  Follow up Call-  Call back number 03/19/2021  Post procedure Call Back phone  # [phone]  Permission to leave phone message Yes  Some recent data might be hidden     Patient questions:  Do you have a fever, pain , or abdominal swelling? No. Pain Score  0 *  Have you tolerated food without any problems? Yes.    Have you been able to return to your normal activities? Yes.    Do you have any questions about your discharge instructions: Diet   No. Medications  No. Follow up visit  No.  Do you have questions or concerns about your Care? No.  Actions: * If pain score is 4 or above: 1. No action needed, pain <4.Have you developed a fever since your procedure? no  2.   Have you had an respiratory symptoms (SOB or cough) since your procedure? no  3.   Have you tested positive for COVID 19 since your procedure no  4.   Have you had any family members/close contacts diagnosed with the COVID 19 since your procedure?  no   If yes to any of these questions please route to Joylene John, RN and Joella Prince, RN

## 2021-03-29 ENCOUNTER — Other Ambulatory Visit: Payer: Self-pay

## 2021-03-29 ENCOUNTER — Encounter: Payer: Self-pay | Admitting: Gastroenterology

## 2021-03-29 ENCOUNTER — Ambulatory Visit (INDEPENDENT_AMBULATORY_CARE_PROVIDER_SITE_OTHER): Payer: PPO | Admitting: Family Medicine

## 2021-03-29 ENCOUNTER — Encounter: Payer: Self-pay | Admitting: Family Medicine

## 2021-03-29 VITALS — BP 132/76 | HR 84 | Temp 98.2°F | Resp 16 | Ht 73.0 in | Wt 275.0 lb

## 2021-03-29 DIAGNOSIS — Z9989 Dependence on other enabling machines and devices: Secondary | ICD-10-CM | POA: Diagnosis not present

## 2021-03-29 DIAGNOSIS — I4891 Unspecified atrial fibrillation: Secondary | ICD-10-CM | POA: Diagnosis not present

## 2021-03-29 DIAGNOSIS — I10 Essential (primary) hypertension: Secondary | ICD-10-CM | POA: Diagnosis not present

## 2021-03-29 DIAGNOSIS — G4733 Obstructive sleep apnea (adult) (pediatric): Secondary | ICD-10-CM

## 2021-03-29 DIAGNOSIS — E785 Hyperlipidemia, unspecified: Secondary | ICD-10-CM | POA: Diagnosis not present

## 2021-03-29 LAB — COMPREHENSIVE METABOLIC PANEL
ALT: 21 U/L (ref 0–53)
AST: 18 U/L (ref 0–37)
Albumin: 4.3 g/dL (ref 3.5–5.2)
Alkaline Phosphatase: 64 U/L (ref 39–117)
BUN: 10 mg/dL (ref 6–23)
CO2: 30 mEq/L (ref 19–32)
Calcium: 9.4 mg/dL (ref 8.4–10.5)
Chloride: 105 mEq/L (ref 96–112)
Creatinine, Ser: 0.72 mg/dL (ref 0.40–1.50)
GFR: 91.11 mL/min (ref >60.00)
Glucose, Bld: 103 mg/dL — ABNORMAL HIGH (ref 70–99)
Potassium: 4.7 mEq/L (ref 3.5–5.1)
Sodium: 141 mEq/L (ref 135–145)
Total Bilirubin: 1 mg/dL (ref 0.2–1.2)
Total Protein: 6.3 g/dL (ref 6.0–8.3)

## 2021-03-29 LAB — LIPID PANEL
Cholesterol: 101 mg/dL (ref 0–200)
HDL: 35.1 mg/dL — ABNORMAL LOW (ref >39.00)
LDL Cholesterol: 47 mg/dL (ref 0–99)
NonHDL: 65.54
Total CHOL/HDL Ratio: 3
Triglycerides: 92 mg/dL (ref 0.0–149.0)
VLDL: 18.4 mg/dL (ref 0.0–40.0)

## 2021-03-29 MED ORDER — VERAPAMIL HCL ER 240 MG PO CP24: 240.0000 mg | ORAL_CAPSULE | Freq: Every day | ORAL | 2 refills | Status: DC

## 2021-03-29 MED ORDER — ATORVASTATIN CALCIUM 40 MG PO TABS: 40.0000 mg | ORAL_TABLET | Freq: Every day | ORAL | 2 refills | Status: DC

## 2021-03-29 MED ORDER — NIACIN ER (ANTIHYPERLIPIDEMIC) 1000 MG PO TBCR: EXTENDED_RELEASE_TABLET | ORAL | 2 refills | Status: DC

## 2021-03-29 NOTE — Patient Instructions (Signed)
No med changes.  I will let you know if any concerns on labs.   Thanks for coming in today and take care.

## 2021-03-29 NOTE — Addendum Note (Signed)
Addended by: Jacob Moores on: 03/29/2021 09:08 AM   Modules accepted: Orders
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
## 2021-04-03 DIAGNOSIS — G4733 Obstructive sleep apnea (adult) (pediatric): Secondary | ICD-10-CM | POA: Diagnosis not present

## 2021-04-25 DIAGNOSIS — D485 Neoplasm of uncertain behavior of skin: Secondary | ICD-10-CM | POA: Diagnosis not present

## 2021-04-25 DIAGNOSIS — Z85828 Personal history of other malignant neoplasm of skin: Secondary | ICD-10-CM | POA: Diagnosis not present

## 2021-04-25 DIAGNOSIS — C44529 Squamous cell carcinoma of skin of other part of trunk: Secondary | ICD-10-CM | POA: Diagnosis not present

## 2021-07-09 ENCOUNTER — Encounter: Payer: Self-pay | Admitting: Adult Health

## 2021-07-09 ENCOUNTER — Ambulatory Visit: Payer: PPO | Admitting: Adult Health

## 2021-07-09 VITALS — BP 136/88 | HR 85 | Ht 73.0 in | Wt 279.2 lb

## 2021-07-09 DIAGNOSIS — G4733 Obstructive sleep apnea (adult) (pediatric): Secondary | ICD-10-CM | POA: Diagnosis not present

## 2021-07-09 DIAGNOSIS — Z9989 Dependence on other enabling machines and devices: Secondary | ICD-10-CM
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
## 2021-08-22 DIAGNOSIS — L578 Other skin changes due to chronic exposure to nonionizing radiation: Secondary | ICD-10-CM | POA: Diagnosis not present

## 2021-08-22 DIAGNOSIS — Z85828 Personal history of other malignant neoplasm of skin: Secondary | ICD-10-CM | POA: Diagnosis not present

## 2021-08-23 ENCOUNTER — Ambulatory Visit: Payer: PPO | Admitting: Cardiology

## 2021-09-12 ENCOUNTER — Other Ambulatory Visit: Payer: Self-pay

## 2021-09-12 ENCOUNTER — Ambulatory Visit (INDEPENDENT_AMBULATORY_CARE_PROVIDER_SITE_OTHER): Payer: PPO | Admitting: Neurology

## 2021-09-12 DIAGNOSIS — D6869 Other thrombophilia: Secondary | ICD-10-CM

## 2021-09-12 DIAGNOSIS — G4733 Obstructive sleep apnea (adult) (pediatric): Secondary | ICD-10-CM | POA: Diagnosis not present

## 2021-09-12 DIAGNOSIS — I4821 Permanent atrial fibrillation: Secondary | ICD-10-CM

## 2021-09-14 ENCOUNTER — Other Ambulatory Visit: Payer: Self-pay | Admitting: Student

## 2021-09-14 MED ORDER — XARELTO 20 MG PO TABS: 20.0000 mg | ORAL_TABLET | Freq: Every day | ORAL | 0 refills | Status: DC

## 2021-09-17 ENCOUNTER — Telehealth: Payer: Self-pay | Admitting: Adult Health

## 2021-09-17 DIAGNOSIS — I4821 Permanent atrial fibrillation: Secondary | ICD-10-CM | POA: Insufficient documentation

## 2021-09-17 DIAGNOSIS — D6869 Other thrombophilia: Secondary | ICD-10-CM | POA: Insufficient documentation

## 2021-09-17 DIAGNOSIS — G4733 Obstructive sleep apnea (adult) (pediatric): Secondary | ICD-10-CM | POA: Insufficient documentation

## 2021-09-17 DIAGNOSIS — Z9989 Dependence on other enabling machines and devices: Secondary | ICD-10-CM

## 2021-09-17 NOTE — Telephone Encounter (Signed)
Please call patient and let him know that his sleep study still shows sleep apnea.  He was best treated at 19 cm water.  Please advise him to avoid sleeping on his back.  He will do autotitration 10 to 20 cm water with EPR 2 if this pressure is too high he should let us know

## 2021-09-17 NOTE — Procedures (Signed)
PATIENT'S NAME:  Edward Watson, Munn DOB:      08/18/1946      MR#:    [id number]     DATE OF RECORDING: 09/12/2021 M. Ronni Rumble M.D.:  Kela Millin, MD, Ward Givens, NP Study Performed:   CPAP  Titration HISTORY:  MM, NP - 07-09-2021 [name] is a 75 year old male with a history of obstructive sleep apnea on CPAP since 2016 and with atrial fibrillation, dyslipidemia , HTN, CAD, smoker, on chronic anticoagulation. Marland Kitchen  He reports that he has machine continues to work well but the residual AHI is elevated.  The patient states he has not noticed any changes in his sleep.  Reports that his mask is not leaking. CPAP compliance: he used his CPAP every night with percent used days greater than 4 hours at 100%, indicating superb compliance with an average usage of 7 hours and 34 minutes, residual AHI suboptimal at 10.9 per hour, leak on the low side with the 95th percentile at 3.4 L/m on a pressure of 18 cm with EPR of 3. Residual AHI elevated primarily due to obstructive events per download. He continues to do well, has been using a FFM, seal seems to be good. GF does not complain about snoring or leak. He has an appointment coming up with his cardiologist. His primary care physician to come off of the losartan as his blood pressure was trending lower. No information about the age of CPAP machine .  The patient endorsed the Epworth Sleepiness Scale at 4/24 points.   The patient's weight 279 pounds with a height of 73 (inches), resulting in a BMI of 37.1 kg/m2. The patient's neck circumference measured 19.5 inches.  CURRENT MEDICATIONS: Lipitor, Multivitamin, Niaspan, Ascorbic, Xarelto    PROCEDURE:  This is a multichannel digital polysomnogram utilizing the SomnoStar 11.2 system.  Electrodes and sensors were applied and monitored per AASM Specifications.   EEG, EOG, Chin and Limb EMG, were sampled at 200 Hz.  ECG, Snore and Nasal Pressure, Thermal Airflow, Respiratory Effort, CPAP Flow and  Pressure, Oximetry was sampled at 50 Hz. Digital video and audio were recorded.       CPAP was initiated at 15 cmH20 with heated humidity per AASM split night standards and pressure was advanced to 19 cmH20 because of hypopneas, apneas and desaturations.  At a PAP pressure of 19 cmH20, with EPR of 2 cm water and under use of a large EVORA FFM, there was a reduction of the AHI to 0 with improvement of sleep apnea.  Lights Out was at 21:51 and Lights On at 04:33. Total recording time (TRT) was 402 minutes, with a total sleep time (TST) of 231.5 minutes. The patient's sleep latency was 151 minutes. REM latency was 198 minutes.  The sleep efficiency was 57.6 %.    SLEEP ARCHITECTURE: WASO (Wake after sleep onset) was 138.5 minutes.  There were 36 minutes in Stage N1, 144 minutes Stage N2, 28.5 minutes Stage N3 and 23 minutes in Stage REM.  The percentage of Stage N1 was 15.6%, Stage N2 was 62.2%, Stage N3 was 12.3% and Stage R (REM sleep) was 9.9%. The sleep architecture was notable for being highly fragmented until 0.45 AM when the patient changed to a non supine sleep position.     RESPIRATORY ANALYSIS:  There was a total of 38 respiratory events: 35 obstructive apneas, 0 central apneas and 0 mixed apneas with a total of 35 apneas and an apnea index (AHI) of 9.1 /hour. There were  3 hypopneas with a hypopnea index of .8/hour. The patient also had 0 respiratory event related arousals (RERAs).      The total APNEA/HYPOPNEA INDEX  (AHI) was 9.8 /hour and the total RESPIRATORY DISTURBANCE INDEX was 9.8 /hour  0 events occurred in REM sleep and 38 events in NREM. The REM AHI was 0 /hour versus a non-REM AHI of 10.9 /hour.  The patient spent 135 minutes of total sleep time in the supine position and 97 minutes in non-supine. The supine AHI was 16.9, versus a non-supine AHI of 0.0.  OXYGEN SATURATION & C02:  The baseline 02 saturation was 93%, with the lowest being 89%. Time spent below 89% saturation equaled  0 minutes.  The arousals were noted as: 54 were spontaneous, 0 were associated with PLMs, 27 were associated with respiratory events. The patient had a total of 0 Periodic Limb Movements.  Audio and video analysis did not show any abnormal or unusual movements, behaviors, phonations or vocalizations.   The patient was fitted with a Large sized Evora full face mask.  DIAGNOSIS Obstructive Sleep Apnea was controlled on 19 cm water with 2 cm EPR, but control dependent partially on sleep position.  The patient had clearly difficulties to initiate and maintain sleep. No evidence of PLMs, Sleep Related Hypoxemia, or loud snoring. Irregular, abnormal EKG   PLANS/RECOMMENDATIONS: The patient should avoid the supine sleep position. CPAP therapy will continue under auto titration CPAP at 15-20 cm water with 2 cm EPR, with an EVORA FF mask in large size and with heated humidification.  Follow-up with referring NP/ physician in 30-90 days post new settings on CPAP. CPAP- Therapy Compliance is defined as 4 hours or more of nightly use.  Any apnea patient should avoid sedatives, hypnotics, and alcohol consumption at bedtime.    DISCUSSION:  A follow up appointment will be scheduled with referring NP in the Sleep Clinic at [facility].   Please call [phone] with any questions.      I certify that I have reviewed the entire raw data recording prior to the issuance of this report in accordance with the Standards of Accreditation of the American Academy of Sleep Medicine (AASM)    Larey Seat, M.D. Diplomat, Tax adviser of Psychiatry and Neurology  [place], Tax adviser of Sleep Medicine

## 2021-09-18 NOTE — Telephone Encounter (Signed)
I will call pt back, as he was in a terrible position at this time.

## 2021-09-18 NOTE — Telephone Encounter (Signed)
I called pt and he is traveling last 4 days using machine and has been doing well.  I relayed that SS still shows osa.  Pressures to autopap change to 10 and 20 cm.  He will check machine, may have been changed remotely but will send order to adapt as well.  He will call back as needed.

## 2021-09-30 ENCOUNTER — Encounter: Payer: Self-pay | Admitting: Family Medicine

## 2021-09-30 ENCOUNTER — Ambulatory Visit (INDEPENDENT_AMBULATORY_CARE_PROVIDER_SITE_OTHER): Payer: PPO | Admitting: Family Medicine

## 2021-09-30 VITALS — BP 128/74 | HR 73 | Temp 98.1°F | Resp 16 | Ht 73.0 in | Wt 275.4 lb

## 2021-09-30 DIAGNOSIS — I10 Essential (primary) hypertension: Secondary | ICD-10-CM | POA: Diagnosis not present

## 2021-09-30 DIAGNOSIS — Z130 Encounter for screening for diseases of the blood and blood-forming organs and certain disorders involving the immune mechanism: Secondary | ICD-10-CM

## 2021-09-30 DIAGNOSIS — Z6836 Body mass index (BMI) 36.0-36.9, adult: Secondary | ICD-10-CM | POA: Diagnosis not present

## 2021-09-30 DIAGNOSIS — E785 Hyperlipidemia, unspecified: Secondary | ICD-10-CM

## 2021-09-30 DIAGNOSIS — Z125 Encounter for screening for malignant neoplasm of prostate: Secondary | ICD-10-CM

## 2021-09-30 DIAGNOSIS — Z0000 Encounter for general adult medical examination without abnormal findings: Secondary | ICD-10-CM | POA: Diagnosis not present

## 2021-09-30 DIAGNOSIS — R739 Hyperglycemia, unspecified: Secondary | ICD-10-CM | POA: Diagnosis not present

## 2021-09-30 DIAGNOSIS — E669 Obesity, unspecified: Secondary | ICD-10-CM | POA: Diagnosis not present

## 2021-09-30 LAB — COMPREHENSIVE METABOLIC PANEL
ALT: 21 U/L (ref 0–53)
AST: 20 U/L (ref 0–37)
Albumin: 4.5 g/dL (ref 3.5–5.2)
Alkaline Phosphatase: 77 U/L (ref 39–117)
BUN: 13 mg/dL (ref 6–23)
CO2: 29 mEq/L (ref 19–32)
Calcium: 9.4 mg/dL (ref 8.4–10.5)
Chloride: 105 mEq/L (ref 96–112)
Creatinine, Ser: 0.7 mg/dL (ref 0.40–1.50)
GFR: 91.56 mL/min (ref >60.00)
Glucose, Bld: 94 mg/dL (ref 70–99)
Potassium: 5.1 mEq/L (ref 3.5–5.1)
Sodium: 141 mEq/L (ref 135–145)
Total Bilirubin: 1.3 mg/dL — ABNORMAL HIGH (ref 0.2–1.2)
Total Protein: 6.5 g/dL (ref 6.0–8.3)

## 2021-09-30 LAB — CBC WITH DIFFERENTIAL/PLATELET
Basophils Absolute: 0 10*3/uL (ref 0.0–0.1)
Basophils Relative: 0.5 % (ref 0.0–3.0)
Eosinophils Absolute: 0.3 10*3/uL (ref 0.0–0.7)
Eosinophils Relative: 4.5 % (ref 0.0–5.0)
HCT: 42.5 % (ref 39.0–52.0)
Hemoglobin: 14 g/dL (ref 13.0–17.0)
Lymphocytes Relative: 28.3 % (ref 12.0–46.0)
Lymphs Abs: 2 10*3/uL (ref 0.7–4.0)
MCHC: 33 g/dL (ref 30.0–36.0)
MCV: 92.9 fl (ref 78.0–100.0)
Monocytes Absolute: 0.5 10*3/uL (ref 0.1–1.0)
Monocytes Relative: 6.3 % (ref 3.0–12.0)
Neutro Abs: 4.3 10*3/uL (ref 1.4–7.7)
Neutrophils Relative %: 60.4 % (ref 43.0–77.0)
Platelets: 182 10*3/uL (ref 150.0–400.0)
RBC: 4.58 Mil/uL (ref 4.22–5.81)
RDW: 13.5 % (ref 11.5–15.5)
WBC: 7.2 10*3/uL (ref 4.0–10.5)

## 2021-09-30 LAB — HEMOGLOBIN A1C: Hgb A1c MFr Bld: 5.8 % (ref 4.6–6.5)

## 2021-09-30 LAB — LIPID PANEL
Cholesterol: 109 mg/dL (ref 0–200)
HDL: 39.7 mg/dL (ref >39.00)
LDL Cholesterol: 52 mg/dL (ref 0–99)
NonHDL: 68.88
Total CHOL/HDL Ratio: 3
Triglycerides: 82 mg/dL (ref 0.0–149.0)
VLDL: 16.4 mg/dL (ref 0.0–40.0)
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
## 2021-09-30 NOTE — Patient Instructions (Addendum)
Thanks for coming in today.   Preventive Care 28 Years and Older, Male Preventive care refers to lifestyle choices and visits with your health care provider that can promote health and wellness. Preventive care visits are also called wellness exams. What can I expect for my preventive care visit? Counseling During your preventive care visit, your health care provider may ask about your: Medical history, including: Past medical problems. Family medical history. History of falls. Current health, including: Emotional well-being. Home life and relationship well-being. Sexual activity. Memory and ability to understand (cognition). Lifestyle, including: Alcohol, nicotine or tobacco, and drug use. Access to firearms. Diet, exercise, and sleep habits. Work and work Statistician. Sunscreen use. Safety issues such as seatbelt and bike helmet use. Physical exam Your health care provider will check your: Height and weight. These may be used to calculate your BMI (body mass index). BMI is a measurement that tells if you are at a healthy weight. Waist circumference. This measures the distance around your waistline. This measurement also tells if you are at a healthy weight and may help predict your risk of certain diseases, such as type 2 diabetes and high blood pressure. Heart rate and blood pressure. Body temperature. Skin for abnormal spots. What immunizations do I need? Vaccines are usually given at various ages, according to a schedule. Your health care provider will recommend vaccines for you based on your age, medical history, and lifestyle or other factors, such as travel or where you work. What tests do I need? Screening Your health care provider may recommend screening tests for certain conditions. This may include: Lipid and cholesterol levels. Diabetes screening. This is done by checking your blood sugar (glucose) after you have not eaten for a while (fasting). Hepatitis C  test. Hepatitis B test. HIV (human immunodeficiency virus) test. STI (sexually transmitted infection) testing, if you are at risk. Lung cancer screening. Colorectal cancer screening. Prostate cancer screening. Abdominal aortic aneurysm (AAA) screening. You may need this if you are a current or former smoker. Talk with your health care provider about your test results, treatment options, and if necessary, the need for more tests. Follow these instructions at home: Eating and drinking  Eat a diet that includes fresh fruits and vegetables, whole grains, lean protein, and low-fat dairy products. Limit your intake of foods with high amounts of sugar, saturated fats, and salt. Take vitamin and mineral supplements as recommended by your health care provider. Do not drink alcohol if your health care provider tells you not to drink. If you drink alcohol: Limit how much you have to 0-2 drinks a day. Know how much alcohol is in your drink. In the [place] one drink equals one 12 oz bottle of beer (355 mL), one 5 oz glass of wine (148 mL), or one 1 oz glass of hard liquor (44 mL). Lifestyle Brush your teeth every morning and night with fluoride toothpaste. Floss one time each day. Exercise for at least 30 minutes 5 or more days each week. Do not use any products that contain nicotine or tobacco. These products include cigarettes, chewing tobacco, and vaping devices, such as e-cigarettes. If you need help quitting, ask your health care provider. Do not use drugs. If you are sexually active, practice safe sex. Use a condom or other form of protection to prevent STIs. Take aspirin only as told by your health care provider. Make sure that you understand how much to take and what form to take. Work with your health care provider to  find out whether it is safe and beneficial for you to take aspirin daily. Ask your health care provider if you need to take a cholesterol-lowering medicine (statin). Find healthy  ways to manage stress, such as: Meditation, yoga, or listening to music. Journaling. Talking to a trusted person. Spending time with friends and family. Safety Always wear your seat belt while driving or riding in a vehicle. Do not drive: If you have been drinking alcohol. Do not ride with someone who has been drinking. When you are tired or distracted. While texting. If you have been using any mind-altering substances or drugs. Wear a helmet and other protective equipment during sports activities. If you have firearms in your house, make sure you follow all gun safety procedures. Minimize exposure to UV radiation to reduce your risk of skin cancer. What's next? Visit your health care provider once a year for an annual wellness visit. Ask your health care provider how often you should have your eyes and teeth checked. Stay up to date on all vaccines. This information is not intended to replace advice given to you by your health care provider. Make sure you discuss any questions you have with your health care provider. Document Revised: 05/01/2021 Document Reviewed: 05/01/2021 Elsevier Patient Education  [place].

## 2021-10-15 ENCOUNTER — Other Ambulatory Visit: Payer: Self-pay

## 2021-10-15 ENCOUNTER — Encounter: Payer: Self-pay | Admitting: Cardiology

## 2021-10-15 ENCOUNTER — Ambulatory Visit: Payer: PPO | Admitting: Cardiology

## 2021-10-15 VITALS — BP 126/84 | HR 63 | Temp 98.7°F | Resp 16 | Ht 73.0 in | Wt 276.4 lb

## 2021-10-15 DIAGNOSIS — I10 Essential (primary) hypertension: Secondary | ICD-10-CM | POA: Diagnosis not present

## 2021-10-15 DIAGNOSIS — I4821 Permanent atrial fibrillation: Secondary | ICD-10-CM | POA: Diagnosis not present

## 2021-10-15 DIAGNOSIS — E7800 Pure hypercholesterolemia, unspecified: Secondary | ICD-10-CM | POA: Diagnosis not present

## 2021-10-15 NOTE — Progress Notes (Signed)
Primary Physician/Referring:  [name], MD  [name], male    DOB: 07-07-1946, 75 y.o.   MRN: [id number]  Chief Complaint  Patient presents with   Atrial Fibrillation   Follow-up    1 year   [name]  is a 75 y.o. male with chroninc atrial fibrillation anticoagulated on Xarelto. OSA compliant with CPAP, tobacco use disorder quit in Dec 2016 with 50 pack year history, hypertension, and hyperlipidemia.  [name], palpitations, dyspnea, syncope, near syncope.  Denies orthopnea, PND, leg swelling.  Patient has maintained weight loss.  He is tolerating anticoagulation without bleeding diathesis.  He monitors blood pressure on a regular basis at home reporting average readings 120-130s/70s mmHg.  Past Medical History:  Diagnosis Date   Atrial fibrillation [facility])    Coronary artery calcification    Dyslipidemia    Erectile dysfunction    Hyperlipidemia    Hypertension    Obesity    OSA (obstructive sleep apnea)    Permanent atrial fibrillation ([place]) 04/25/2015   Cardioversion successful after 3 attempts on 03/20/2015; pt back in A. Fib on 03/28/2015. Anticoagulation long-term.   Sleep apnea    Phreesia 09/25/2020   Tobacco user 06/22/2013   Pt quit smoking October 2014.    Past Surgical History:  Procedure Laterality Date   CARDIOVERSION N/A 03/20/2015   Procedure: CARDIOVERSION;  Surgeon: [name], MD;  Location: [facility] ENDOSCOPY;  Service: Cardiovascular;  Laterality: N/A;   COLONOSCOPY  2014   COLONOSCOPY  03/19/2021   TONSILLECTOMY     age 29   VASECTOMY     Family History  Problem Relation Age of Onset   Cancer Mother        kind unknown   Lung cancer Sister    Colon cancer Paternal Grandmother    Esophageal cancer Neg Hx    Inflammatory bowel disease Neg Hx    Liver disease Neg Hx    Colon polyps Neg Hx     Stomach cancer Neg Hx    Rectal cancer Neg Hx    Sleep apnea Neg Hx     Social History   Tobacco Use   Smoking status: Former    Packs/day: 1.00    Years: 50.00    Pack years: 50.00    Types: Cigarettes    Quit date: 08/26/2013    Years since quitting: 8.1   Smokeless tobacco: Never   Tobacco comments:    0 cigarettes for 3 weeks  Substance Use Topics   Alcohol use: Yes    Alcohol/week: 1.0 standard drink    Types: 1 Cans of beer per week    Comment: rare occasion, maybe once monthly   ROS  Review of Systems  Constitutional: Negative for malaise/fatigue.  Cardiovascular:  Negative for chest pain, leg swelling, near-syncope and palpitations.  Respiratory:  Positive for snoring (on CPAP). Negative for shortness of breath.   Gastrointestinal:  Negative for melena.  Objective  Blood pressure 126/84, pulse 63, temperature 98.7 F (37.1 C), temperature source Temporal, resp. rate 16, height 6\' 1"  (1.854 m), weight 276 lb 6.4 oz (125.4 kg), SpO2 96 %.  Vitals with BMI 10/15/2021 09/30/2021 07/09/2021  Height 6\' 1"  6\' 1"  6\' 1"   Weight 276 lbs 6 oz 275 lbs 6 oz 279 lbs 3 oz  BMI 36.47 76.22 63.33  Systolic  741 287 867  Diastolic 84 74 88  Pulse 63 73 85     Physical Exam HENT:     Head: Atraumatic.  Cardiovascular:     Rate and Rhythm: Rhythm irregularly irregular.     Pulses:          Carotid pulses are 2+ on the right side and 2+ on the left side.      Femoral pulses are 2+ on the right side and 2+ on the left side.      Dorsalis pedis pulses are 2+ on the right side and 0 on the left side.       Posterior tibial pulses are 0 on the right side and 2+ on the left side.     Heart sounds: No murmur heard.   No gallop. No S3 or S4 sounds.     Comments: S1 is variable, S2 is normal. Pulmonary:     Effort: Pulmonary effort is normal.     Breath sounds: Normal breath sounds.  Musculoskeletal:     Right lower leg: Edema (minimal) present.     Left lower leg: Edema (minimal)  present.   Laboratory examination:   Recent Labs    03/29/21 0908 09/30/21 0856  NA 141 141  K 4.7 5.1  CL 105 105  CO2 30 29  GLUCOSE 103* 94  BUN 10 13  CREATININE 0.72 0.70  CALCIUM 9.4 9.4   estimated creatinine clearance is 114.1 mL/min (by C-G formula based on SCr of 0.7 mg/dL).  CMP Latest Ref Rng & Units 09/30/2021 03/29/2021 05/28/2020  Glucose 70 - 99 mg/dL 94 103(H) 91  BUN 6 - 23 mg/dL 13 10 10   Creatinine 0.40 - 1.50 mg/dL 0.70 0.72 0.74(L)  Sodium 135 - 145 mEq/L 141 141 143  Potassium 3.5 - 5.1 mEq/L 5.1 4.7 4.4  Chloride 96 - 112 mEq/L 105 105 106  CO2 19 - 32 mEq/L 29 30 25   Calcium 8.4 - 10.5 mg/dL 9.4 9.4 9.4  Total Protein 6.0 - 8.3 g/dL 6.5 6.3 6.7  Total Bilirubin 0.2 - 1.2 mg/dL 1.3(H) 1.0 0.7  Alkaline Phos 39 - 117 U/L 77 64 74  AST 0 - 37 U/L 20 18 23   ALT 0 - 53 U/L 21 21 26    CBC Latest Ref Rng & Units 09/30/2021 08/23/2020 09/20/2018  WBC 4.0 - 10.5 K/uL 7.2 6.3 7.3  Hemoglobin 13.0 - 17.0 g/dL 14.0 14.2 13.9  Hematocrit 39.0 - 52.0 % 42.5 42.1 42.2  Platelets 150.0 - 400.0 K/uL 182.0 194 204   Lipid Panel Recent Labs    03/29/21 0908 09/30/21 0856  CHOL 101 109  TRIG 92.0 82.0  LDLCALC 47 52  VLDL 18.4 16.4  HDL 35.10* 39.70  CHOLHDL 3 3    HEMOGLOBIN A1C Lab Results  Component Value Date   HGBA1C 5.8 09/30/2021   MPG 120 (H) 01/02/2016   TSH No results for input(s): TSH in the last 8760 hours.  Allergies  No Known Allergies    Medications Prior to Visit:   Outpatient Medications Prior to Visit  Medication Sig Dispense Refill   atorvastatin (LIPITOR) 40 MG tablet Take 1 tablet (40 mg total) by mouth daily. 90 tablet 2   Multiple Vitamin (MULTIVITAMIN) tablet Take 1 tablet by mouth daily.     niacin (NIASPAN) 1000 MG CR tablet Take 1 tablet by mouth once daily as directed with a low fat snack and aspirin 30 minutes prior to taking medication 90 tablet  2   verapamil (VERELAN PM) 240 MG 24 hr capsule Take 1 capsule (240 mg  total) by mouth at bedtime. 90 capsule 2   vitamin C (ASCORBIC ACID) 500 MG tablet Take 500 mg by mouth daily.     XARELTO 20 MG TABS tablet Take 1 tablet (20 mg total) by mouth daily. Take one tablet daily at supper. 7 tablet 0   No facility-administered medications prior to visit.     Final Medications at End of Visit    Current Meds  Medication Sig   atorvastatin (LIPITOR) 40 MG tablet Take 1 tablet (40 mg total) by mouth daily.   Multiple Vitamin (MULTIVITAMIN) tablet Take 1 tablet by mouth daily.   niacin (NIASPAN) 1000 MG CR tablet Take 1 tablet by mouth once daily as directed with a low fat snack and aspirin 30 minutes prior to taking medication   verapamil (VERELAN PM) 240 MG 24 hr capsule Take 1 capsule (240 mg total) by mouth at bedtime.   vitamin C (ASCORBIC ACID) 500 MG tablet Take 500 mg by mouth daily.   XARELTO 20 MG TABS tablet Take 1 tablet (20 mg total) by mouth daily. Take one tablet daily at supper.   Radiology:   CT Scan of Chest  [08/24/2013]: Normal lung fields. Coronary artery calcification present primarily in the distribution of the LAD.  Cardiac Studies:   Sleep study 12/20/2014: Moderate to severe obstructive sleep apnea, successfully treated with CPAP. Follows Dr. Rexene Alberts.  Nuclear stress test  [11/20/2014]: 1. The resting electrocardiogram demonstrated atrial fibrillation and incomplete RBBB. The stress electrocardiogram was non-diagnostic due to pharmacological stress testing with Lexican. The stress test was terminated because of dyspnea and end of protocol. 2. SPECT images demonstrate homogeneous tracer distribution throughout the myocardium. The left ventricular ejection fraction was calculated or visually estimated to be 38%. The estimation of the EF could be an error due to A. fibrillation. Clinical correlation recommended, this is a low risk scan.  Echocardiogram 06/20/2020: Normal LV systolic function with visual EF 55-60%. Left ventricle cavity is  normal in size. Moderate left ventricular hypertrophy. Normal global wall motion. Unable to evaluate diastolic function due to atrial fibrillation. Elevated LAP. Calculated EF 59%. Left atrial cavity is severely dilated. Grossly right atrial size is moderately dilated. Mild (Grade I) aortic regurgitation. Aortic sclerosis without stenosis. Mild tricuspid regurgitation. Mild pulmonary hypertension. RVSP measures 38 mmHg. Insignificant pericardial effusion. There is no hemodynamic significance. The aortic root is dilated, sinus tubular junction 3.8cm IVC is dilated with a respiratory response of <50%. Compared to prior study dated 11/21/2014: Mild AR, Mild TR, mild PHTN, and aortic dilation are new findings.   EKG:  10/15/2021: Atrial fibrillation with controlled ventricular response at a rate of 93 bpm.  EKG 08/23/2020: Atrial fibrillation with controlled ventricular spots at rate of 78 bpm, normal axis, poor R wave progression, probably normal variant.  No evidence of ischemia, normal QT interval.  EKG 01/20/2020: Atrial fibrillation with controlled ventricular response at the rate of 83 bpm, normal axis, no evidence of ischemia.  Low-voltage complexes.  ABNORMAL compared to 01/20/2019, heart rate is much better controlled.   Assessment     ICD-10-CM   1. Permanent atrial fibrillation (HCC)  I48.21 EKG 12-Lead    2. Essential hypertension  I10     3. Hypercholesteremia  E78.00        No orders of the defined types were placed in this encounter.   There are no discontinued medications.  Recommendations:   Edward Watson  is a 75 y.o. male  male with chroninc atrial fibrillation anticoagulated on Xarelto. OSA compliant with CPAP, tobacco use disorder quit in Dec 2016 with 50 pack year history, hypertension, and hyperlipidemia.   Patient presents for annual follow-up of atrial fibrillation.  He continues to do well with rate controlled A. fib and remains asymptomatic.  He is  tolerating Xarelto without bleeding diathesis.  Patient remains compliant with CPAP.  There is no clinical evidence of heart failure.  Patient's blood pressure is well controlled and lipids are under excellent control.  Patient is overall stable from a cardiovascular standpoint, follow-up in 1 year, sooner if needed, for atrial fibrillation.   Alethia Berthold, PA-C 10/15/2021, 4:38 PM Office: [phone]

## 2021-10-31 ENCOUNTER — Other Ambulatory Visit: Payer: Self-pay

## 2021-10-31 MED ORDER — XARELTO 20 MG PO TABS: 20.0000 mg | ORAL_TABLET | Freq: Every day | ORAL | 3 refills | Status: DC

## 2021-12-26 DIAGNOSIS — G4733 Obstructive sleep apnea (adult) (pediatric): Secondary | ICD-10-CM | POA: Diagnosis not present

## 2022-02-12 ENCOUNTER — Telehealth: Payer: Self-pay

## 2022-02-12 ENCOUNTER — Other Ambulatory Visit: Payer: Self-pay

## 2022-02-12 DIAGNOSIS — I4891 Unspecified atrial fibrillation: Secondary | ICD-10-CM

## 2022-02-12 DIAGNOSIS — E785 Hyperlipidemia, unspecified: Secondary | ICD-10-CM

## 2022-02-12 MED ORDER — VERAPAMIL HCL ER 240 MG PO CP24: 240.0000 mg | ORAL_CAPSULE | Freq: Every day | ORAL | 2 refills | Status: DC

## 2022-02-12 MED ORDER — ATORVASTATIN CALCIUM 40 MG PO TABS: 40.0000 mg | ORAL_TABLET | Freq: Every day | ORAL | 2 refills | Status: DC

## 2022-02-12 NOTE — Telephone Encounter (Signed)
MEDICATION: verapamil (VERELAN PM) 240 MG 24 hr capsule // atorvastatin (LIPITOR) 40 MG tablet ? ?PHARMACY: [facility] [place] [address] ? ?Comments:  ? ?**Let patient know to contact pharmacy at the end of the day to make sure medication is ready. ** ? ?** Please notify patient to allow 48-72 hours to process** ? ?**Encourage patient to contact the pharmacy for refills or they can request refills through [facility]** ? ? ?

## 2022-03-11 ENCOUNTER — Encounter: Payer: Self-pay | Admitting: Family Medicine

## 2022-03-20 ENCOUNTER — Other Ambulatory Visit: Payer: Self-pay

## 2022-03-20 ENCOUNTER — Telehealth: Payer: Self-pay | Admitting: Family Medicine

## 2022-03-20 DIAGNOSIS — E785 Hyperlipidemia, unspecified: Secondary | ICD-10-CM

## 2022-03-20 MED ORDER — NIACIN ER (ANTIHYPERLIPIDEMIC) 1000 MG PO TBCR: EXTENDED_RELEASE_TABLET | ORAL | 0 refills | Status: DC

## 2022-03-20 MED ORDER — NIACIN ER (ANTIHYPERLIPIDEMIC) 1000 MG PO TBCR: EXTENDED_RELEASE_TABLET | ORAL | 2 refills | Status: DC

## 2022-03-20 NOTE — Telephone Encounter (Signed)
Pt called in stating that the pharmacy has  reached out to [place] for a refill request on the Niacin, pt would like a 90 day supply sent to the mail order and also a short supply to the [facility] on [address]  ? ?Pt is almost out  ? ?He states that the pharmacy requested this refill over 8 days ago but I don't see anything  ?

## 2022-03-31 ENCOUNTER — Ambulatory Visit (INDEPENDENT_AMBULATORY_CARE_PROVIDER_SITE_OTHER): Payer: PPO | Admitting: Family Medicine

## 2022-03-31 ENCOUNTER — Encounter: Payer: Self-pay | Admitting: Family Medicine

## 2022-03-31 VITALS — BP 138/78 | HR 87 | Temp 98.2°F | Resp 16 | Ht 73.0 in | Wt 284.6 lb

## 2022-03-31 DIAGNOSIS — Z9989 Dependence on other enabling machines and devices: Secondary | ICD-10-CM

## 2022-03-31 DIAGNOSIS — E785 Hyperlipidemia, unspecified: Secondary | ICD-10-CM

## 2022-03-31 DIAGNOSIS — I10 Essential (primary) hypertension: Secondary | ICD-10-CM

## 2022-03-31 DIAGNOSIS — G4733 Obstructive sleep apnea (adult) (pediatric): Secondary | ICD-10-CM

## 2022-03-31 DIAGNOSIS — I4891 Unspecified atrial fibrillation: Secondary | ICD-10-CM | POA: Diagnosis not present

## 2022-03-31 DIAGNOSIS — R739 Hyperglycemia, unspecified: Secondary | ICD-10-CM

## 2022-03-31 MED ORDER — NIACIN ER (ANTIHYPERLIPIDEMIC) 1000 MG PO TBCR: EXTENDED_RELEASE_TABLET | ORAL | 1 refills | Status: DC

## 2022-03-31 NOTE — Patient Instructions (Addendum)
If return of back pain, please schedule visit. ?No change in meds today.  ?Walking for exercise and watch diet to help manage weight.  ?Lab visit this week.  ? ?Take care! ? ? ?
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
## 2022-04-01 ENCOUNTER — Other Ambulatory Visit (INDEPENDENT_AMBULATORY_CARE_PROVIDER_SITE_OTHER): Payer: PPO

## 2022-04-01 DIAGNOSIS — E785 Hyperlipidemia, unspecified: Secondary | ICD-10-CM

## 2022-04-01 LAB — LIPID PANEL
Cholesterol: 120 mg/dL (ref 0–200)
HDL: 35.1 mg/dL — ABNORMAL LOW (ref >39.00)
LDL Cholesterol: 54 mg/dL (ref 0–99)
NonHDL: 84.43
Total CHOL/HDL Ratio: 3
Triglycerides: 154 mg/dL — ABNORMAL HIGH (ref 0.0–149.0)
VLDL: 30.8 mg/dL (ref 0.0–40.0)

## 2022-04-01 LAB — HEMOGLOBIN A1C: Hgb A1c MFr Bld: 5.6 % (ref 4.6–6.5)

## 2022-08-25 ENCOUNTER — Inpatient Hospital Stay (HOSPITAL_COMMUNITY)
Admission: EM | Admit: 2022-08-25 | Discharge: 2022-08-28 | DRG: 418 | Disposition: A | Discharge status: Home / Self Care | Payer: PPO | Source: Ambulatory Visit | Attending: Internal Medicine | Admitting: Internal Medicine

## 2022-08-25 ENCOUNTER — Other Ambulatory Visit: Payer: Self-pay

## 2022-08-25 ENCOUNTER — Emergency Department (HOSPITAL_COMMUNITY): Payer: PPO

## 2022-08-25 ENCOUNTER — Ambulatory Visit (INDEPENDENT_AMBULATORY_CARE_PROVIDER_SITE_OTHER): Payer: PPO | Admitting: Family Medicine

## 2022-08-25 ENCOUNTER — Inpatient Hospital Stay (HOSPITAL_COMMUNITY): Payer: PPO

## 2022-08-25 ENCOUNTER — Encounter (HOSPITAL_COMMUNITY): Payer: Self-pay | Admitting: Emergency Medicine

## 2022-08-25 ENCOUNTER — Encounter: Payer: Self-pay | Admitting: Family Medicine

## 2022-08-25 VITALS — BP 138/72 | HR 108 | Temp 99.0°F | Ht 73.0 in | Wt 281.2 lb

## 2022-08-25 DIAGNOSIS — E8809 Other disorders of plasma-protein metabolism, not elsewhere classified: Secondary | ICD-10-CM | POA: Diagnosis not present

## 2022-08-25 DIAGNOSIS — D696 Thrombocytopenia, unspecified: Secondary | ICD-10-CM | POA: Diagnosis present

## 2022-08-25 DIAGNOSIS — E669 Obesity, unspecified: Secondary | ICD-10-CM | POA: Diagnosis not present

## 2022-08-25 DIAGNOSIS — K8050 Calculus of bile duct without cholangitis or cholecystitis without obstruction: Secondary | ICD-10-CM | POA: Diagnosis not present

## 2022-08-25 DIAGNOSIS — R7989 Other specified abnormal findings of blood chemistry: Principal | ICD-10-CM

## 2022-08-25 DIAGNOSIS — R6883 Chills (without fever): Secondary | ICD-10-CM

## 2022-08-25 DIAGNOSIS — R739 Hyperglycemia, unspecified: Secondary | ICD-10-CM | POA: Diagnosis present

## 2022-08-25 DIAGNOSIS — D649 Anemia, unspecified: Secondary | ICD-10-CM | POA: Diagnosis present

## 2022-08-25 DIAGNOSIS — Z87891 Personal history of nicotine dependence: Secondary | ICD-10-CM

## 2022-08-25 DIAGNOSIS — R1010 Upper abdominal pain, unspecified: Secondary | ICD-10-CM | POA: Diagnosis not present

## 2022-08-25 DIAGNOSIS — K8689 Other specified diseases of pancreas: Secondary | ICD-10-CM | POA: Diagnosis not present

## 2022-08-25 DIAGNOSIS — I2510 Atherosclerotic heart disease of native coronary artery without angina pectoris: Secondary | ICD-10-CM | POA: Diagnosis not present

## 2022-08-25 DIAGNOSIS — K8066 Calculus of gallbladder and bile duct with acute and chronic cholecystitis without obstruction: Secondary | ICD-10-CM | POA: Diagnosis not present

## 2022-08-25 DIAGNOSIS — I4821 Permanent atrial fibrillation: Secondary | ICD-10-CM | POA: Diagnosis not present

## 2022-08-25 DIAGNOSIS — K660 Peritoneal adhesions (postprocedural) (postinfection): Secondary | ICD-10-CM | POA: Diagnosis not present

## 2022-08-25 DIAGNOSIS — B179 Acute viral hepatitis, unspecified: Secondary | ICD-10-CM | POA: Diagnosis not present

## 2022-08-25 DIAGNOSIS — Z1152 Encounter for screening for COVID-19: Secondary | ICD-10-CM | POA: Diagnosis not present

## 2022-08-25 DIAGNOSIS — E876 Hypokalemia: Secondary | ICD-10-CM | POA: Diagnosis not present

## 2022-08-25 DIAGNOSIS — R945 Abnormal results of liver function studies: Secondary | ICD-10-CM | POA: Diagnosis not present

## 2022-08-25 DIAGNOSIS — G4733 Obstructive sleep apnea (adult) (pediatric): Secondary | ICD-10-CM | POA: Diagnosis not present

## 2022-08-25 DIAGNOSIS — E785 Hyperlipidemia, unspecified: Secondary | ICD-10-CM | POA: Diagnosis not present

## 2022-08-25 DIAGNOSIS — E871 Hypo-osmolality and hyponatremia: Secondary | ICD-10-CM | POA: Diagnosis not present

## 2022-08-25 DIAGNOSIS — K8020 Calculus of gallbladder without cholecystitis without obstruction: Secondary | ICD-10-CM | POA: Diagnosis not present

## 2022-08-25 DIAGNOSIS — Z801 Family history of malignant neoplasm of trachea, bronchus and lung: Secondary | ICD-10-CM

## 2022-08-25 DIAGNOSIS — K828 Other specified diseases of gallbladder: Secondary | ICD-10-CM | POA: Diagnosis not present

## 2022-08-25 DIAGNOSIS — Z7901 Long term (current) use of anticoagulants: Secondary | ICD-10-CM

## 2022-08-25 DIAGNOSIS — E872 Acidosis, unspecified: Secondary | ICD-10-CM | POA: Diagnosis present

## 2022-08-25 DIAGNOSIS — R17 Unspecified jaundice: Secondary | ICD-10-CM

## 2022-08-25 DIAGNOSIS — K8012 Calculus of gallbladder with acute and chronic cholecystitis without obstruction: Secondary | ICD-10-CM | POA: Diagnosis not present

## 2022-08-25 DIAGNOSIS — R822 Biliuria: Secondary | ICD-10-CM | POA: Diagnosis not present

## 2022-08-25 DIAGNOSIS — K3189 Other diseases of stomach and duodenum: Secondary | ICD-10-CM | POA: Diagnosis not present

## 2022-08-25 DIAGNOSIS — Z6837 Body mass index (BMI) 37.0-37.9, adult: Secondary | ICD-10-CM | POA: Diagnosis not present

## 2022-08-25 DIAGNOSIS — I10 Essential (primary) hypertension: Secondary | ICD-10-CM | POA: Diagnosis present

## 2022-08-25 DIAGNOSIS — D6869 Other thrombophilia: Secondary | ICD-10-CM | POA: Diagnosis present

## 2022-08-25 DIAGNOSIS — Z79899 Other long term (current) drug therapy: Secondary | ICD-10-CM | POA: Diagnosis not present

## 2022-08-25 DIAGNOSIS — Z800 Family history of malignant neoplasm of digestive organs: Secondary | ICD-10-CM

## 2022-08-25 DIAGNOSIS — K760 Fatty (change of) liver, not elsewhere classified: Secondary | ICD-10-CM | POA: Diagnosis present

## 2022-08-25 DIAGNOSIS — I4891 Unspecified atrial fibrillation: Secondary | ICD-10-CM | POA: Diagnosis not present

## 2022-08-25 DIAGNOSIS — K8063 Calculus of gallbladder and bile duct with acute cholecystitis with obstruction: Principal | ICD-10-CM | POA: Diagnosis present

## 2022-08-25 DIAGNOSIS — Z8719 Personal history of other diseases of the digestive system: Secondary | ICD-10-CM

## 2022-08-25 DIAGNOSIS — K838 Other specified diseases of biliary tract: Secondary | ICD-10-CM | POA: Diagnosis not present

## 2022-08-25 LAB — CBC WITH DIFFERENTIAL/PLATELET
Abs Immature Granulocytes: 0.03 10*3/uL (ref 0.00–0.07)
Basophils Absolute: 0 10*3/uL (ref 0.0–0.1)
Basophils Relative: 0 %
Eosinophils Absolute: 0 10*3/uL (ref 0.0–0.5)
Eosinophils Relative: 0 %
HCT: 40.9 % (ref 39.0–52.0)
Hemoglobin: 13.6 g/dL (ref 13.0–17.0)
Immature Granulocytes: 0 %
Lymphocytes Relative: 8 %
Lymphs Abs: 0.7 10*3/uL (ref 0.7–4.0)
MCH: 30.7 pg (ref 26.0–34.0)
MCHC: 33.3 g/dL (ref 30.0–36.0)
MCV: 92.3 fL (ref 80.0–100.0)
Monocytes Absolute: 0.6 10*3/uL (ref 0.1–1.0)
Monocytes Relative: 6 %
Neutro Abs: 7.9 10*3/uL — ABNORMAL HIGH (ref 1.7–7.7)
Neutrophils Relative %: 86 %
Platelets: 125 10*3/uL — ABNORMAL LOW (ref 150–400)
RBC: 4.43 MIL/uL (ref 4.22–5.81)
RDW: 13.8 % (ref 11.5–15.5)
WBC: 9.3 10*3/uL (ref 4.0–10.5)
nRBC: 0 % (ref 0.0–0.2)

## 2022-08-25 LAB — COMPREHENSIVE METABOLIC PANEL
ALT: 340 U/L — ABNORMAL HIGH (ref 0–44)
AST: 162 U/L — ABNORMAL HIGH (ref 15–41)
Albumin: 4.1 g/dL (ref 3.5–5.0)
Alkaline Phosphatase: 117 U/L (ref 38–126)
Anion gap: 10 (ref 5–15)
BUN: 12 mg/dL (ref 8–23)
CO2: 24 mmol/L (ref 22–32)
Calcium: 9 mg/dL (ref 8.9–10.3)
Chloride: 100 mmol/L (ref 98–111)
Creatinine, Ser: 0.64 mg/dL (ref 0.61–1.24)
GFR, Estimated: >60 mL/min (ref >60)
Glucose, Bld: 117 mg/dL — ABNORMAL HIGH (ref 70–99)
Potassium: 3.4 mmol/L — ABNORMAL LOW (ref 3.5–5.1)
Sodium: 134 mmol/L — ABNORMAL LOW (ref 135–145)
Total Bilirubin: 7.5 mg/dL — ABNORMAL HIGH (ref 0.3–1.2)
Total Protein: 7.1 g/dL (ref 6.5–8.1)

## 2022-08-25 LAB — POCT URINALYSIS DIP (MANUAL ENTRY)
Glucose, UA: NEGATIVE mg/dL
Leukocytes, UA: NEGATIVE
Nitrite, UA: NEGATIVE
Protein Ur, POC: 30 mg/dL — AB
Spec Grav, UA: 1.025 (ref 1.010–1.025)
Urobilinogen, UA: 0.2 E.U./dL
pH, UA: 6 (ref 5.0–8.0)

## 2022-08-25 LAB — HEMOGLOBIN A1C
Hgb A1c MFr Bld: 5.3 % (ref 4.8–5.6)
Mean Plasma Glucose: 105.41 mg/dL

## 2022-08-25 LAB — PROTIME-INR
INR: 1.3 — ABNORMAL HIGH (ref 0.8–1.2)
Prothrombin Time: 15.7 seconds — ABNORMAL HIGH (ref 11.4–15.2)

## 2022-08-25 LAB — TSH: TSH: 0.805 u[IU]/mL (ref 0.350–4.500)

## 2022-08-25 LAB — LIPASE, BLOOD: Lipase: 40 U/L (ref 11–51)

## 2022-08-25 LAB — SARS CORONAVIRUS 2 BY RT PCR: SARS Coronavirus 2 by RT PCR: NEGATIVE

## 2022-08-25 LAB — HEPARIN LEVEL (UNFRACTIONATED): Heparin Unfractionated: 0.29 IU/mL — ABNORMAL LOW (ref 0.30–0.70)

## 2022-08-25 LAB — APTT: aPTT: 33 seconds (ref 24–36)

## 2022-08-25 MED ORDER — METRONIDAZOLE 500 MG/100ML IV SOLN
500.0000 mg | Freq: Two times a day (BID) | INTRAVENOUS | Status: DC
  Administered 2022-08-25 – 2022-08-27 (×4): 500 mg via INTRAVENOUS
  Filled 2022-08-25 (×4): qty 100

## 2022-08-25 MED ORDER — ADULT MULTIVITAMIN W/MINERALS CH
1.0000 | ORAL_TABLET | Freq: Every day | ORAL | Status: DC
  Administered 2022-08-28: 1 via ORAL
  Filled 2022-08-25 (×2): qty 1

## 2022-08-25 MED ORDER — POTASSIUM CHLORIDE CRYS ER 20 MEQ PO TBCR
40.0000 meq | EXTENDED_RELEASE_TABLET | Freq: Two times a day (BID) | ORAL | Status: DC
  Administered 2022-08-25 – 2022-08-28 (×4): 40 meq via ORAL
  Filled 2022-08-25 (×5): qty 2

## 2022-08-25 MED ORDER — SODIUM CHLORIDE 0.9 % IV BOLUS
1000.0000 mL | Freq: Once | INTRAVENOUS | Status: AC
  Administered 2022-08-25: 1000 mL via INTRAVENOUS

## 2022-08-25 MED ORDER — ONDANSETRON HCL 4 MG PO TABS: 4.0000 mg | ORAL_TABLET | Freq: Four times a day (QID) | ORAL | Status: DC | PRN

## 2022-08-25 MED ORDER — VITAMIN C 500 MG PO TABS
500.0000 mg | ORAL_TABLET | Freq: Every day | ORAL | Status: DC
  Administered 2022-08-28: 500 mg via ORAL
  Filled 2022-08-25 (×2): qty 1

## 2022-08-25 MED ORDER — VERAPAMIL HCL ER 240 MG PO TBCR
240.0000 mg | EXTENDED_RELEASE_TABLET | Freq: Every day | ORAL | Status: DC
  Administered 2022-08-25 – 2022-08-27 (×3): 240 mg via ORAL
  Filled 2022-08-25 (×3): qty 1

## 2022-08-25 MED ORDER — HEPARIN (PORCINE) 25000 UT/250ML-% IV SOLN
1800.0000 [IU]/h | INTRAVENOUS | Status: DC
  Administered 2022-08-25: 1650 [IU]/h via INTRAVENOUS
  Filled 2022-08-25: qty 250

## 2022-08-25 MED ORDER — SODIUM CHLORIDE 0.9 % IV SOLN: INTRAVENOUS | Status: DC

## 2022-08-25 MED ORDER — ONDANSETRON HCL 4 MG/2ML IJ SOLN: 4.0000 mg | Freq: Four times a day (QID) | INTRAMUSCULAR | Status: DC | PRN

## 2022-08-25 MED ORDER — SODIUM CHLORIDE 0.9 % IV SOLN
2.0000 g | INTRAVENOUS | Status: DC
  Administered 2022-08-26: 2 g via INTRAVENOUS
  Filled 2022-08-25: qty 20

## 2022-08-25 MED ORDER — OXYCODONE HCL 5 MG PO TABS
5.0000 mg | ORAL_TABLET | ORAL | Status: DC | PRN
  Administered 2022-08-28: 5 mg via ORAL
  Filled 2022-08-25 (×2): qty 1

## 2022-08-25 MED ORDER — IOHEXOL 300 MG/ML  SOLN
100.0000 mL | Freq: Once | INTRAMUSCULAR | Status: AC | PRN
  Administered 2022-08-25: 100 mL via INTRAVENOUS

## 2022-08-25 MED ORDER — SODIUM CHLORIDE 0.9 % IV SOLN
2.0000 g | Freq: Once | INTRAVENOUS | Status: AC
  Administered 2022-08-25: 2 g via INTRAVENOUS
  Filled 2022-08-25: qty 20

## 2022-08-25 MED ORDER — ACETAMINOPHEN 650 MG RE SUPP: 650.0000 mg | Freq: Four times a day (QID) | RECTAL | Status: DC | PRN

## 2022-08-25 MED ORDER — ACETAMINOPHEN 325 MG PO TABS: 650.0000 mg | ORAL_TABLET | Freq: Four times a day (QID) | ORAL | Status: DC | PRN

## 2022-08-25 MED ORDER — SODIUM CHLORIDE (PF) 0.9 % IJ SOLN
INTRAMUSCULAR | Status: AC
  Filled 2022-08-25: qty 50

## 2022-08-25 MED ORDER — LEVALBUTEROL HCL 0.63 MG/3ML IN NEBU: 0.6300 mg | INHALATION_SOLUTION | Freq: Four times a day (QID) | RESPIRATORY_TRACT | Status: DC | PRN

## 2022-08-25 MED ORDER — MORPHINE SULFATE (PF) 2 MG/ML IV SOLN: 2.0000 mg | INTRAVENOUS | Status: DC | PRN

## 2022-08-25 NOTE — Progress Notes (Signed)
ANTICOAGULATION CONSULT NOTE   Pharmacy Consult for heparin Indication: atrial fibrillation  No Known Allergies  Patient Measurements:   Heparin Dosing Weight: 108.3 kg   Vital Signs: Temp: 99.3 F (37.4 C) (10/09 1852) Temp Source: Oral (10/09 1852) BP: 170/94 (10/09 1852) Pulse Rate: 67 (10/09 1852)  Labs: Recent Labs    08/25/22 1409  HGB 13.6  HCT 40.9  PLT 125*  LABPROT 15.7*  INR 1.3*  CREATININE 0.64    Estimated Creatinine Clearance: 113.4 mL/min (by C-G formula based on SCr of 0.64 mg/dL).   Medical History: Past Medical History:  Diagnosis Date   Atrial fibrillation ([place])    Coronary artery calcification    Dyslipidemia    Erectile dysfunction    Hyperlipidemia    Hypertension    Obesity    OSA (obstructive sleep apnea)    Permanent atrial fibrillation ([facility]) 04/25/2015   Cardioversion successful after 3 attempts on 03/20/2015; pt back in A. Fib on 03/28/2015. Anticoagulation long-term.   Sleep apnea    Phreesia 09/25/2020   Tobacco user 06/22/2013   Pt quit smoking October 2014.     Medications:  Medications Prior to Admission  Medication Sig Dispense Refill Last Dose   ALEVE 220 MG tablet Take 220 mg by mouth 2 (two) times daily as needed (for pain).   unk   atorvastatin (LIPITOR) 40 MG tablet Take 1 tablet (40 mg total) by mouth daily. (Patient taking differently: Take 40 mg by mouth at bedtime.) 90 tablet 2 08/24/2022 at pm   Multiple Vitamins-Minerals (CENTRUM SILVER 50+MEN) TABS Take 1 tablet by mouth daily with breakfast.   08/24/2022 at am   niacin (NIASPAN) 1000 MG CR tablet Take 1 tablet by mouth once daily as directed with a low fat snack and aspirin 30 minutes prior to taking medication (Patient taking differently: Take 1,000 mg by mouth See admin instructions. Take 1,000 mg by mouth once daily as directed with a low fat snack) 90 tablet 1 08/24/2022   verapamil (VERELAN PM) 240 MG 24 hr capsule Take 1 capsule (240 mg total) by mouth at  bedtime. 90 capsule 2 08/24/2022   vitamin C (ASCORBIC ACID) 500 MG tablet Take 500 mg by mouth daily.   08/24/2022   XARELTO 20 MG TABS tablet Take 1 tablet (20 mg total) by mouth daily. 90 tablet 3 08/24/2022 at 1330    Assessment: Pharmacy is consulted to dose heparin drip in 76 yo male with PMH of Atrial fibrillation. Pt is on Xarelto PTA. Xarelto currently on hold due to likely ERCP on Wednesday. Last dose of Xarelto on 10/8 at 1330 per med rec.   Today, 08/25/22 Baseline HL is 0.29, reflecting recent Xarelto doses aPTT IP Hgb 13.6, plt 125 SCr 0.64 mg/dl, CrCl > 100 ml/min  Will initially dose using aPTT pending further correlation between HL and aPTT    Goal of Therapy:  Heparin level 0.3-0.7 units/ml Monitor platelets by anticoagulation protocol: Yes   Plan:  Start heparin drip at 1650 units/hr Obtain aPTT 8 hours after start of infusion Daily CBC, aPTT and HL  Monitor for signs and symptoms of bleeding   Royetta Asal, PharmD, BCPS 08/25/2022 8:21 PM

## 2022-08-25 NOTE — Addendum Note (Signed)
Addended by: Trixie Deis on: 08/25/2022 02:28 PM   Modules accepted: Orders

## 2022-08-25 NOTE — ED Triage Notes (Signed)
Patient reports sent from PCP for further evaluation of jaundice and dark urine. UA in office today. Denies pain.

## 2022-08-25 NOTE — Progress Notes (Signed)
Patient arrived to unit alert and oriented, call bell within reach, cardiac monitor placed. VS taken

## 2022-08-25 NOTE — Telephone Encounter (Signed)
Called patient he is coming in today at 70

## 2022-08-25 NOTE — Patient Instructions (Signed)
Sorry to hear that you are sick.  With the abdominal pain, shaking chills and now with the yellowing of the skin or jaundice, further evaluation will need to be performed through the emergency room.  Please go there after leaving our office.  I will watch further testing and results and certainly if needed I am happy to follow-up with you after the emergency room visit.  Thank you for coming in today and hang in there.

## 2022-08-25 NOTE — Consult Note (Signed)
Reason for Consult: Abdominal pain Referring Physician: Dr. Delman Kitten Edward Watson is an 76 y.o. male.  HPI: The patient is a 76 year old white male who initially had some abdominal pain a couple days ago.  The pain was associated with some shaking chills.  The pain was mostly on the right side.  The pain seems to have resolved.  He came to the emergency department where a CT scan was performed that showed evidence of possible common bile duct stone.  His liver functions were significantly elevated with a bilirubin of 7.5 he is on Xarelto for A-fib  Past Medical History:  Diagnosis Date   Atrial fibrillation ([place])    Coronary artery calcification    Dyslipidemia    Erectile dysfunction    Hyperlipidemia    Hypertension    Obesity    OSA (obstructive sleep apnea)    Permanent atrial fibrillation ([place]) 04/25/2015   Cardioversion successful after 3 attempts on 03/20/2015; pt back in A. Fib on 03/28/2015. Anticoagulation long-term.   Sleep apnea    Phreesia 09/25/2020   Tobacco user 06/22/2013   Pt quit smoking October 2014.     Past Surgical History:  Procedure Laterality Date   CARDIOVERSION N/A 03/20/2015   Procedure: CARDIOVERSION;  Surgeon: [name], MD;  Location: [facility] ENDOSCOPY;  Service: Cardiovascular;  Laterality: N/A;   COLONOSCOPY  2014   COLONOSCOPY  03/19/2021   TONSILLECTOMY     age 69   VASECTOMY      Family History  Problem Relation Age of Onset   Cancer Mother        kind unknown   Lung cancer Sister    Colon cancer Paternal Grandmother    Esophageal cancer Neg Hx    Inflammatory bowel disease Neg Hx    Liver disease Neg Hx    Colon polyps Neg Hx    Stomach cancer Neg Hx    Rectal cancer Neg Hx    Sleep apnea Neg Hx     Social History:  reports that he quit smoking about 9 years ago. His smoking use included cigarettes. He has a 50.00 pack-year smoking history. He has never used smokeless tobacco. He reports current alcohol use of about 1.0 standard drink  of alcohol per week. He reports current drug use. Frequency: 7.00 times per week. Drug: Marijuana.  Allergies: No Known Allergies  Medications: I have reviewed the patient's current medications.  Results for orders placed or performed during the hospital encounter of 08/25/22 (from the past 48 hour(s))  CBC with Differential     Status: Abnormal   Collection Time: 08/25/22  2:09 PM  Result Value Ref Range   WBC 9.3 4.0 - 10.5 K/uL   RBC 4.43 4.22 - 5.81 MIL/uL   Hemoglobin 13.6 13.0 - 17.0 g/dL   HCT 40.9 39.0 - 52.0 %   MCV 92.3 80.0 - 100.0 fL   MCH 30.7 26.0 - 34.0 pg   MCHC 33.3 30.0 - 36.0 g/dL   RDW 13.8 11.5 - 15.5 %   Platelets 125 (L) 150 - 400 K/uL   nRBC 0.0 0.0 - 0.2 %   Neutrophils Relative % 86 %   Neutro Abs 7.9 (H) 1.7 - 7.7 K/uL   Lymphocytes Relative 8 %   Lymphs Abs 0.7 0.7 - 4.0 K/uL   Monocytes Relative 6 %   Monocytes Absolute 0.6 0.1 - 1.0 K/uL   Eosinophils Relative 0 %   Eosinophils Absolute 0.0 0.0 - 0.5 K/uL  Basophils Relative 0 %   Basophils Absolute 0.0 0.0 - 0.1 K/uL   Immature Granulocytes 0 %   Abs Immature Granulocytes 0.03 0.00 - 0.07 K/uL    Comment: Performed at [facility], [place] [address] [place], [place] [address]  Comprehensive metabolic panel     Status: Abnormal   Collection Time: 08/25/22  2:09 PM  Result Value Ref Range   Sodium 134 (L) 135 - 145 mmol/L   Potassium 3.4 (L) 3.5 - 5.1 mmol/L   Chloride 100 98 - 111 mmol/L   CO2 24 22 - 32 mmol/L   Glucose, Bld 117 (H) 70 - 99 mg/dL    Comment: Glucose reference range applies only to samples taken after fasting for at least 8 hours.   BUN 12 8 - 23 mg/dL   Creatinine, Ser 0.64 0.61 - 1.24 mg/dL   Calcium 9.0 8.9 - 10.3 mg/dL   Total Protein 7.1 6.5 - 8.1 g/dL   Albumin 4.1 3.5 - 5.0 g/dL   AST 162 (H) 15 - 41 U/L   ALT 340 (H) 0 - 44 U/L   Alkaline Phosphatase 117 38 - 126 U/L   Total Bilirubin 7.5 (H) 0.3 - 1.2 mg/dL   GFR, Estimated >60 >60 mL/min     Comment: (NOTE) Calculated using the CKD-EPI Creatinine Equation (2021)    Anion gap 10 5 - 15    Comment: Performed at [facility], [place] [address], [place], [place] [address]  Protime-INR     Status: Abnormal   Collection Time: 08/25/22  2:09 PM  Result Value Ref Range   Prothrombin Time 15.7 (H) 11.4 - 15.2 seconds   INR 1.3 (H) 0.8 - 1.2    Comment: (NOTE) INR goal varies based on device and disease states. Performed at [facility], [place] [address], [place], [place] [address]     CT ABDOMEN PELVIS W CONTRAST  Result Date: 08/25/2022 CLINICAL DATA:  Acute hepatitis. EXAM: CT ABDOMEN AND PELVIS WITH CONTRAST TECHNIQUE: Multidetector CT imaging of the abdomen and pelvis was performed using the standard protocol following bolus administration of intravenous contrast. RADIATION DOSE REDUCTION: This exam was performed according to the departmental dose-optimization program which includes automated exposure control, adjustment of the mA and/or kV according to patient size and/or use of iterative reconstruction technique. CONTRAST:  178m OMNIPAQUE IOHEXOL 300 MG/ML  SOLN COMPARISON:  Chest CT 08/24/2013 FINDINGS: Lower chest: Normal heart size. Coronary arterial vascular calcifications. Dependent atelectasis within the bilateral lower lobes. Hepatobiliary: The liver is normal in size and contour. No focal hepatic lesion is identified. Gallbladder is mildly distended. Cholelithiasis. Mild central intrahepatic biliary ductal dilatation. Common bile duct measures 10 mm. At the level of the distal common bile duct in the region of the pancreatic head there is internal calcific material (image 40; series 2). Pancreas: Unremarkable Spleen: Unremarkable Adrenals/Urinary Tract: Normal adrenal glands. Kidneys are symmetric in size. There is a partially exophytic 1.3 cm cyst off the superior pole of the right kidney. No hydronephrosis. Urinary bladder is unremarkable.  Stomach/Bowel: Descending and sigmoid colonic diverticulosis. No CT evidence for acute diverticulitis. The appendix is normal. No evidence for bowel obstruction. No free fluid or free intraperitoneal air. Normal morphology of the stomach. Vascular/Lymphatic: Normal caliber abdominal aorta. Peripheral calcified atherosclerotic plaque. No retroperitoneal lymphadenopathy. Reproductive: Hernia. Other: Small fat containing right inguinal hernia. Musculoskeletal: Lumbar spine degenerative changes. No aggressive or acute appearing osseous lesions. IMPRESSION: 1. There is mild central  intrahepatic biliary ductal dilatation and dilatation of the common bile duct. There is internal calcific material within the distal common bile duct at the level of the pancreatic head, most compatible with choledocholithiasis. Consider further evaluation with MRCP. 2. The gallbladder is mildly distended with suggestion of mild wall thickening and surrounding fat stranding, nonspecific. Mild/early cholecystitis is not excluded. Consider further evaluation with right upper quadrant ultrasound. 3. Cholelithiasis. Electronically Signed   By: Lovey Newcomer M.D.   On: 08/25/2022 15:52    Review of Systems  Constitutional:  Positive for chills.  HENT: Negative.    Eyes: Negative.   Respiratory: Negative.    Cardiovascular: Negative.   Gastrointestinal:  Positive for abdominal pain. Negative for nausea.  Endocrine: Negative.   Genitourinary: Negative.   Musculoskeletal: Negative.   Skin:  Positive for color change.  Allergic/Immunologic: Negative.   Neurological: Negative.   Hematological: Negative.   Psychiatric/Behavioral: Negative.     Blood pressure (!) 155/107, pulse (!) 109, temperature 97.9 F (36.6 C), temperature source Oral, resp. rate 18, SpO2 96 %. Physical Exam Vitals reviewed.  Constitutional:      General: He is not in acute distress.    Appearance: Normal appearance. He is obese.  HENT:     Head: Normocephalic  and atraumatic.     Right Ear: External ear normal.     Left Ear: External ear normal.     Nose: Nose normal.     Mouth/Throat:     Mouth: Mucous membranes are moist.     Pharynx: Oropharynx is clear.  Eyes:     General: Scleral icterus present.     Extraocular Movements: Extraocular movements intact.     Conjunctiva/sclera: Conjunctivae normal.     Pupils: Pupils are equal, round, and reactive to light.  Cardiovascular:     Rate and Rhythm: Normal rate. Rhythm irregular.     Pulses: Normal pulses.     Heart sounds: Normal heart sounds.  Pulmonary:     Effort: Pulmonary effort is normal. No respiratory distress.     Breath sounds: Normal breath sounds.  Abdominal:     General: Abdomen is flat. Bowel sounds are normal.     Palpations: Abdomen is soft.     Tenderness: There is no abdominal tenderness.  Musculoskeletal:        General: No swelling or deformity. Normal range of motion.     Cervical back: Normal range of motion and neck supple.  Skin:    General: Skin is warm and dry.     Coloration: Skin is jaundiced.  Neurological:     General: No focal deficit present.     Mental Status: He is alert and oriented to person, place, and time.  Psychiatric:        Mood and Affect: Mood normal.        Behavior: Behavior normal.     Assessment/Plan: The patient appears to have common bile duct stones as well as gallstones.  At this point he will be evaluated by GI for possible ERCP.  We will follow his liver functions.  Once they have normalized then he will probably benefit from having his gallbladder removed during this hospitalization.  We will follow him closely with you.  [name] 08/25/2022, 4:57 PM
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
## 2022-08-25 NOTE — ED Notes (Signed)
GI MD at bedside

## 2022-08-25 NOTE — H&P (Signed)
History and Physical    Patient: Edward Watson TML:[id number] DOB: March 05, 1946 DOA: 08/25/2022 DOS: the patient was seen and examined on 08/25/2022 PCP: [name], MD  Patient coming from: Home  Chief Complaint:  Chief Complaint  Patient presents with   Abnormal Lab   HPI: Edward Watson is a 76 y.o. male with medical history significant of permanent atrial fibrillation, CAD, dyslipidemia, ED, hyperlipidemia, hypertension, obesity, OSA, sleep apnea, history of tobacco abuse disorder as well as other comorbidities who presented to the hospital with abdominal pain and jaundice.  Patient was evaluated by his PCP earlier for his abdominal pain and was noticed having dark urine eyes and a yellowish hue.  Patient states abdominal pain started a few days ago and noticed acute onset and he also had some pain in his lower back and was unable to get comfortable due to his abdominal discomfort.  He stated that he had fevers and chills last few days and noticed yellowing of the skin and eyes at last night with a very dark color urine.  He had some chills and denies any chest pain or shortness of breath.  Because of this he was sent to the ED directly from his PCP office and patient states that he had upper abdominal pain were started from the left side and radiated to the right.  He felt as if his abdominal wall muscles were very "sore" did not really want to eat.  Given his abnormal bilirubin he was sent to the ED for further evaluation and [facility] hospitalist was consulted for admission with GI consultation and general surgery consultation given that he was found to have choledocholithiasis on imaging.  Currently he states his abdominal pain is resolved denied dysuria but did notice dark-colored urine, chest pain or shortness of breath.  Review of Systems: As mentioned in the history of present illness. All other systems reviewed and are negative. Past Medical History:  Diagnosis Date   Atrial  fibrillation [facility])    Coronary artery calcification    Dyslipidemia    Erectile dysfunction    Hyperlipidemia    Hypertension    Obesity    OSA (obstructive sleep apnea)    Permanent atrial fibrillation ([place]) 04/25/2015   Cardioversion successful after 3 attempts on 03/20/2015; pt back in A. Fib on 03/28/2015. Anticoagulation long-term.   Sleep apnea    Phreesia 09/25/2020   Tobacco user 06/22/2013   Pt quit smoking October 2014.    Past Surgical History:  Procedure Laterality Date   CARDIOVERSION N/A 03/20/2015   Procedure: CARDIOVERSION;  Surgeon: [name], MD;  Location: [facility] ENDOSCOPY;  Service: Cardiovascular;  Laterality: N/A;   COLONOSCOPY  2014   COLONOSCOPY  03/19/2021   TONSILLECTOMY     age 56   VASECTOMY     Social History:  reports that he quit smoking about 9 years ago. His smoking use included cigarettes. He has a 50.00 pack-year smoking history. He has never used smokeless tobacco. He reports current alcohol use of about 1.0 standard drink of alcohol per week. He reports current drug use. Frequency: 7.00 times per week. Drug: Marijuana.  No Known Allergies  Family History  Problem Relation Age of Onset   Cancer Mother        kind unknown   Lung cancer Sister    Colon cancer Paternal Grandmother    Esophageal cancer Neg Hx    Inflammatory bowel disease Neg Hx    Liver disease Neg Hx  Colon polyps Neg Hx    Stomach cancer Neg Hx    Rectal cancer Neg Hx    Sleep apnea Neg Hx     Prior to Admission medications   Medication Sig Start Date End Date Taking? Authorizing Provider  ALEVE 220 MG tablet Take 220 mg by mouth 2 (two) times daily as needed (for pain).   Yes [provider]  atorvastatin (LIPITOR) 40 MG tablet Take 1 tablet (40 mg total) by mouth daily. Patient taking differently: Take 40 mg by mouth at bedtime. 02/12/22  Yes [name], MD  Multiple Vitamins-Minerals (CENTRUM SILVER 50+MEN) TABS Take 1 tablet by mouth daily with breakfast.    Yes [provider]  niacin (NIASPAN) 1000 MG CR tablet Take 1 tablet by mouth once daily as directed with a low fat snack and aspirin 30 minutes prior to taking medication Patient taking differently: Take 1,000 mg by mouth See admin instructions. Take 1,000 mg by mouth once daily as directed with a low fat snack 03/31/22  Yes [name], MD  verapamil (VERELAN PM) 240 MG 24 hr capsule Take 1 capsule (240 mg total) by mouth at bedtime. 02/12/22  Yes [name], MD  vitamin C (ASCORBIC ACID) 500 MG tablet Take 500 mg by mouth daily.   Yes [provider]  XARELTO 20 MG TABS tablet Take 1 tablet (20 mg total) by mouth daily. 10/31/21  Yes [name], MD    Physical Exam: Vitals:   08/25/22 1500 08/25/22 1515 08/25/22 1600 08/25/22 1852  BP: (!) 151/85 (!) 155/107  (!) 170/94  Pulse: (!) 104 (!) 109  67  Resp:  18  18  Temp:   97.9 F (36.6 C) 99.3 F (37.4 C)  TempSrc:   Oral Oral  SpO2: 96% 96%  97%   Examination: Physical Exam:  Constitutional: WN/WD obese jaundiced male in no acute distress appears calm and comfortable now Respiratory: Diminished to auscultation bilaterally, no wheezing, rales, rhonchi or crackles. Normal respiratory effort and patient is not tachypenic. No accessory muscle use.  Cardiovascular: Irregularly irregular and tachycardic, no murmurs / rubs / gallops. S1 and S2 auscultated.  Extremity edema Abdomen: Soft, non-tender, stented secondary body habitus. Bowel sounds positive.  GU: Deferred. Musculoskeletal: No clubbing / cyanosis of digits/nails. No joint deformity upper and lower extremities.  Skin: No rashes, lesions, ulcers on limited skin evaluation patient is jaundiced and does have a yellowish hue. No induration; Warm and dry.  Neurologic: CN 2-12 grossly intact with no focal deficits. Romberg sign and cerebellar reflexes not assessed.  Psychiatric: Normal judgment and insight. Alert and oriented x 3. Normal mood and  appropriate affect.   Data Reviewed:  Recent Results (from the past 2160 hour(s))  POCT urinalysis dipstick     Status: Abnormal   Collection Time: 08/25/22 12:15 PM  Result Value Ref Range   Color, UA other (A) yellow   Clarity, UA cloudy (A) clear   Glucose, UA negative negative mg/dL   Bilirubin, UA large (A) negative   Ketones, POC UA moderate (40) (A) negative mg/dL   Spec Grav, UA 1.025 1.010 - 1.025   Blood, UA trace-intact (A) negative   pH, UA 6.0 5.0 - 8.0   Protein Ur, POC =30 (A) negative mg/dL   Urobilinogen, UA 0.2 0.2 or 1.0 E.U./dL   Nitrite, UA Negative Negative   Leukocytes, UA Negative Negative  CBC with Differential     Status: Abnormal   Collection Time:  08/25/22  2:09 PM  Result Value Ref Range   WBC 9.3 4.0 - 10.5 K/uL   RBC 4.43 4.22 - 5.81 MIL/uL   Hemoglobin 13.6 13.0 - 17.0 g/dL   HCT 40.9 39.0 - 52.0 %   MCV 92.3 80.0 - 100.0 fL   MCH 30.7 26.0 - 34.0 pg   MCHC 33.3 30.0 - 36.0 g/dL   RDW 13.8 11.5 - 15.5 %   Platelets 125 (L) 150 - 400 K/uL   nRBC 0.0 0.0 - 0.2 %   Neutrophils Relative % 86 %   Neutro Abs 7.9 (H) 1.7 - 7.7 K/uL   Lymphocytes Relative 8 %   Lymphs Abs 0.7 0.7 - 4.0 K/uL   Monocytes Relative 6 %   Monocytes Absolute 0.6 0.1 - 1.0 K/uL   Eosinophils Relative 0 %   Eosinophils Absolute 0.0 0.0 - 0.5 K/uL   Basophils Relative 0 %   Basophils Absolute 0.0 0.0 - 0.1 K/uL   Immature Granulocytes 0 %   Abs Immature Granulocytes 0.03 0.00 - 0.07 K/uL    Comment: Performed at [facility], [place] [address], [place], [place] [address]  Comprehensive metabolic panel     Status: Abnormal   Collection Time: 08/25/22  2:09 PM  Result Value Ref Range   Sodium 134 (L) 135 - 145 mmol/L   Potassium 3.4 (L) 3.5 - 5.1 mmol/L   Chloride 100 98 - 111 mmol/L   CO2 24 22 - 32 mmol/L   Glucose, Bld 117 (H) 70 - 99 mg/dL    Comment: Glucose reference range applies only to samples taken after fasting for at least 8 hours.   BUN  12 8 - 23 mg/dL   Creatinine, Ser 0.64 0.61 - 1.24 mg/dL   Calcium 9.0 8.9 - 10.3 mg/dL   Total Protein 7.1 6.5 - 8.1 g/dL   Albumin 4.1 3.5 - 5.0 g/dL   AST 162 (H) 15 - 41 U/L   ALT 340 (H) 0 - 44 U/L   Alkaline Phosphatase 117 38 - 126 U/L   Total Bilirubin 7.5 (H) 0.3 - 1.2 mg/dL   GFR, Estimated >60 >60 mL/min    Comment: (NOTE) Calculated using the CKD-EPI Creatinine Equation (2021)    Anion gap 10 5 - 15    Comment: Performed at [facility], [place] [address] [place], [place] [address]  Protime-INR     Status: Abnormal   Collection Time: 08/25/22  2:09 PM  Result Value Ref Range   Prothrombin Time 15.7 (H) 11.4 - 15.2 seconds   INR 1.3 (H) 0.8 - 1.2    Comment: (NOTE) INR goal varies based on device and disease states. Performed at [facility], [place] [address], [place], [place] [address]   SARS Coronavirus 2 by RT PCR (hospital order, performed in [facility] hospital lab) *cepheid single result test* Anterior Nasal Swab     Status: None   Collection Time: 08/25/22  5:05 PM   Specimen: Anterior Nasal Swab  Result Value Ref Range   SARS Coronavirus 2 by RT PCR NEGATIVE NEGATIVE    Comment: (NOTE) SARS-CoV-2 target nucleic acids are NOT DETECTED.  The SARS-CoV-2 RNA is generally detectable in upper and lower respiratory specimens during the acute phase of infection. The lowest concentration of SARS-CoV-2 viral copies this assay can detect is 250 copies / mL. A negative result does not preclude SARS-CoV-2 infection and should not be used as the sole basis for  treatment or other patient management decisions.  A negative result may occur with improper specimen collection / handling, submission of specimen other than nasopharyngeal swab, presence of viral mutation(s) within the areas targeted by this assay, and inadequate number of viral copies (<250 copies / mL). A negative result must be combined with clinical observations, patient  history, and epidemiological information.  Fact Sheet for Patients:   https://www.patel.info/  Fact Sheet for Healthcare Providers: https://hall.com/  This test is not yet approved or  cleared by the [place] FDA and has been authorized for detection and/or diagnosis of SARS-CoV-2 by FDA under an Emergency Use Authorization (EUA).  This EUA will remain in effect (meaning this test can be used) for the duration of the COVID-19 declaration under Section 564(b)(1) of the Act, 21 U.S.C. section 360bbb-3(b)(1), unless the authorization is terminated or revoked sooner.  Performed at [facility], [place] [address] [place], [place] [address]   Lipase, blood     Status: None   Collection Time: 08/25/22  5:06 PM  Result Value Ref Range   Lipase 40 11 - 51 U/L    Comment: Performed at [facility] [place] [address], [place], [place] [address]   Assessment and Plan: No notes have been filed under this hospital service. Service: Hospitalist  Abdominal Pain in the Setting of Obstructive Jaundice from Chloledocholithasis with questionable acute cholecystitis Abnormal LFTs Hyperbilirubinemia -Patient presents with worsening abdominal pain and jaundice that was evaluated by his PCP and he was sent to the ED for further evaluation -Last dose of his anticoagulation with Xarelto was yesterday and plan is for ERCP eventually with sphincterotomy -CT scan of the abdomen pelvis done and showed "There is mild central intrahepatic biliary ductal dilatation and dilatation of the common bile duct. There is internal calcific material within the distal common bile duct at the level of the pancreatic head, most compatible with choledocholithiasis. Consider further evaluation with MRCP. The gallbladder is mildly distended with suggestion of mild wall thickening and surrounding fat stranding, nonspecific. Mild/early cholecystitis is  not excluded. Consider further evaluation with right upper quadrant ultrasound. Cholelithiasis." -RUQ U/S done and showed "Cholelithiasis, without associated sonographic findings to suggest acute cholecystitis. Hepatic steatosis." -LFTs done and showed an AST of 162, ALT of 340, total bilirubin of 7.5 -Hold Xarelto and changed to heparin drip -GI consulted and they feel that he is convincing evidence for choledocholithiasis without needing an MRCP and they are going to proceed for an ERCP after the Eliquis was washed out. -Continuing IV ceftriaxone and Flagyl for possible acute cholecystitis -We will make him n.p.o. after midnight for possible endoscopic procedure -Hold Aleve to 20 mg p.o. daily -General surgery also consulted and they are recommending GI evaluation once his liver functions have normalized and he has a ERCP done there recommending that he has this gallbladder removed during his hospitalization  Permanent Atrial Fibrillation with RVR -Continue monitor on telemetry -Changed Xarelto to heparin drip and continue to hold oral anticoagulation -Getting IV fluid hydration given that he had some RVR  Acquired Thrombophilia -In the setting of permanent atrial fibrillation -Continue with anticoagulation as above  Essential Hypertension -Continue with home antihypertensives with p.o. verapamil to 40 mg p.o. nightly -Continue monitor blood pressures per protocol -Last blood pressure reading was 170/94  Hyperlipidemia -Holding atorvastatin 40 mg p.o. daily as well as niacin 1 tab p.o. as directed low-fat snack and aspirin 30 minutes prior to taking medication  Hyponatremia -Mild at 134 -  Patient was started on IV fluid hydration with normal saline at 75 MLS per hour -Continue to monitor and trend and repeat CMP in a.m.  Hyperglycemia -Mild and likely active -Continue monitor CBGs per protocol -Check hemoglobin A1c -If necessary will place on sensitive NovoLog sign scale insulin  AC  Hypokalemia -Mild at 3.4 -Replete with p.o. KCl 40 mEQs twice daily x2 doses -Check magnesium level in the a.m. -Continue monitor and replete as necessary and repeat CMP in a.m.  Thrombocytopenia -Patient takes anticoagulation and currently his Xarelto is being held and will change to IV heparin and will need to monitor for signs and symptoms bleeding -Patient's platelet count is now 125 -Repeat CBC in a.m.  OSA -C/w CPAP qHS  Obesity -Complicates overall prognosis and care -Estimated body mass index is 37.1 kg/m as calculated from the following:   Height as of an earlier encounter on 08/25/22: '6\' 1"'$  (1.854 m).   Weight as of an earlier encounter on 08/25/22: 127.6 kg.  -Weight Loss and Dietary Counseling given   Advance Care Planning:   Code Status: Full Code FULL CODE  Consults: Gastroenterology, general surgery  Family Communication: Discussed with wife at bedside  Severity of Illness: The appropriate patient status for this patient is INPATIENT. Inpatient status is judged to be reasonable and necessary in order to provide the required intensity of service to ensure the patient's safety. The patient's presenting symptoms, physical exam findings, and initial radiographic and laboratory data in the context of their chronic comorbidities is felt to place them at high risk for further clinical deterioration. Furthermore, it is not anticipated that the patient will be medically stable for discharge from the hospital within 2 midnights of admission.   * I certify that at the point of admission it is my clinical judgment that the patient will require inpatient hospital care spanning beyond 2 midnights from the point of admission due to high intensity of service, high risk for further deterioration and high frequency of surveillance required.*  Author: Raiford Noble, DO 08/25/2022 7:30 PM  For on call review [email or web address].
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
## 2022-08-25 NOTE — ED Provider Notes (Signed)
[facility] DEPT Provider Note   CSN: [id number] Arrival date & time: 08/25/22  1256     History  Chief Complaint  Patient presents with   Abnormal Lab    Edward Watson is a 76 y.o. male.  76 yo M with a chief complaints of abdominal pain and jaundice.  The patient states that a couple days ago he felt a severe pain that went from the left side of his abdomen across the top.  This lasted for a few hours and then resolved.  After which he felt like his abdominal wall muscles were very sore.  Has not really felt like eating or drinking since then.  Was able to tolerate some soups and has been drinking lots of water.  He saw his family doctor today who felt like he was jaundiced and saw that his urine had large bilirubin and was sent here for further evaluation.  No fevers or chills.  He feels like the abdominal pain is improved significantly.   Abnormal Lab      Home Medications Prior to Admission medications   Medication Sig Start Date End Date Taking? Authorizing Provider  atorvastatin (LIPITOR) 40 MG tablet Take 1 tablet (40 mg total) by mouth daily. 02/12/22   [name], MD  Multiple Vitamin (MULTIVITAMIN) tablet Take 1 tablet by mouth daily.    [provider]  niacin (NIASPAN) 1000 MG CR tablet Take 1 tablet by mouth once daily as directed with a low fat snack and aspirin 30 minutes prior to taking medication 03/31/22   [name], MD  verapamil (VERELAN PM) 240 MG 24 hr capsule Take 1 capsule (240 mg total) by mouth at bedtime. 02/12/22   [name], MD  vitamin C (ASCORBIC ACID) 500 MG tablet Take 500 mg by mouth daily.    [provider]  XARELTO 20 MG TABS tablet Take 1 tablet (20 mg total) by mouth daily. 10/31/21   [name], MD      Allergies    Patient has no known allergies.    Review of Systems   Review of Systems  Physical Exam Updated Vital Signs BP (!) 155/107   Pulse (!) 109   Temp  97.8 F (36.6 C) (Oral)   Resp 18   SpO2 96%  Physical Exam Vitals and nursing note reviewed.  Constitutional:      Appearance: He is well-developed.  HENT:     Head: Normocephalic and atraumatic.  Eyes:     Pupils: Pupils are equal, round, and reactive to light.  Neck:     Vascular: No JVD.  Cardiovascular:     Rate and Rhythm: Normal rate and regular rhythm.     Heart sounds: No murmur heard.    No friction rub. No gallop.  Pulmonary:     Effort: No respiratory distress.     Breath sounds: No wheezing.  Abdominal:     General: There is no distension.     Tenderness: There is no abdominal tenderness. There is no guarding or rebound.     Comments: I feel I can palpate the patient's liver edge about 4 fingerbreadths below the costal margin  Musculoskeletal:        General: Normal range of motion.     Cervical back: Normal range of motion and neck supple.  Skin:    Coloration: Skin is not pale.     Findings: No rash.  Neurological:     Mental  Status: He is alert and oriented to person, place, and time.  Psychiatric:        Behavior: Behavior normal.     ED Results / Procedures / Treatments   Labs (all labs ordered are listed, but only abnormal results are displayed) Labs Reviewed  CBC WITH DIFFERENTIAL/PLATELET - Abnormal; Notable for the following components:      Result Value   Platelets 125 (*)    Neutro Abs 7.9 (*)    All other components within normal limits  COMPREHENSIVE METABOLIC PANEL - Abnormal; Notable for the following components:   Sodium 134 (*)    Potassium 3.4 (*)    Glucose, Bld 117 (*)    AST 162 (*)    ALT 340 (*)    Total Bilirubin 7.5 (*)    All other components within normal limits  PROTIME-INR - Abnormal; Notable for the following components:   Prothrombin Time 15.7 (*)    INR 1.3 (*)    All other components within normal limits    EKG None  Radiology CT ABDOMEN PELVIS W CONTRAST  Result Date: 08/25/2022 CLINICAL DATA:  Acute  hepatitis. EXAM: CT ABDOMEN AND PELVIS WITH CONTRAST TECHNIQUE: Multidetector CT imaging of the abdomen and pelvis was performed using the standard protocol following bolus administration of intravenous contrast. RADIATION DOSE REDUCTION: This exam was performed according to the departmental dose-optimization program which includes automated exposure control, adjustment of the mA and/or kV according to patient size and/or use of iterative reconstruction technique. CONTRAST:  146m OMNIPAQUE IOHEXOL 300 MG/ML  SOLN COMPARISON:  Chest CT 08/24/2013 FINDINGS: Lower chest: Normal heart size. Coronary arterial vascular calcifications. Dependent atelectasis within the bilateral lower lobes. Hepatobiliary: The liver is normal in size and contour. No focal hepatic lesion is identified. Gallbladder is mildly distended. Cholelithiasis. Mild central intrahepatic biliary ductal dilatation. Common bile duct measures 10 mm. At the level of the distal common bile duct in the region of the pancreatic head there is internal calcific material (image 40; series 2). Pancreas: Unremarkable Spleen: Unremarkable Adrenals/Urinary Tract: Normal adrenal glands. Kidneys are symmetric in size. There is a partially exophytic 1.3 cm cyst off the superior pole of the right kidney. No hydronephrosis. Urinary bladder is unremarkable. Stomach/Bowel: Descending and sigmoid colonic diverticulosis. No CT evidence for acute diverticulitis. The appendix is normal. No evidence for bowel obstruction. No free fluid or free intraperitoneal air. Normal morphology of the stomach. Vascular/Lymphatic: Normal caliber abdominal aorta. Peripheral calcified atherosclerotic plaque. No retroperitoneal lymphadenopathy. Reproductive: Hernia. Other: Small fat containing right inguinal hernia. Musculoskeletal: Lumbar spine degenerative changes. No aggressive or acute appearing osseous lesions. IMPRESSION: 1. There is mild central intrahepatic biliary ductal dilatation and  dilatation of the common bile duct. There is internal calcific material within the distal common bile duct at the level of the pancreatic head, most compatible with choledocholithiasis. Consider further evaluation with MRCP. 2. The gallbladder is mildly distended with suggestion of mild wall thickening and surrounding fat stranding, nonspecific. Mild/early cholecystitis is not excluded. Consider further evaluation with right upper quadrant ultrasound. 3. Cholelithiasis. Electronically Signed   By: DLovey NewcomerM.D.   On: 08/25/2022 15:52    Procedures Procedures    Medications Ordered in ED Medications  cefTRIAXone (ROCEPHIN) 2 g in sodium chloride 0.9 % 100 mL IVPB (has no administration in time range)  metroNIDAZOLE (FLAGYL) IVPB 500 mg (has no administration in time range)  sodium chloride 0.9 % bolus 1,000 mL (0 mLs Intravenous Stopped 08/25/22 1540)  sodium  chloride (PF) 0.9 % injection (  Given 08/25/22 1540)  iohexol (OMNIPAQUE) 300 MG/ML solution 100 mL (100 mLs Intravenous Contrast Given 08/25/22 1539)    ED Course/ Medical Decision Making/ A&P                           Medical Decision Making Amount and/or Complexity of Data Reviewed Labs: ordered. Radiology: ordered.  Risk Prescription drug management.   76 yo M sent here by the PCP for concern for new jaundice and abdominal pain.  Patient's pain has resolved.  Patient does feel like he is jaundiced especially around the eyes.  Had a UA and his doctor's office that has large bilirubin.  Will obtain LFTs INR.  Patient denies any alcohol use denies any Tylenol use.  Denies recent GI illness.  Denies recent travel.  LFTs are elevated.  The patient's total bilirubin is greater than 7.  We will obtain a CT scan of the abdomen pelvis.  CT with calcification in CBD.  Discussed with [facility] to see.   Will start on abx.    Will discuss with hospitalist for admission.   CRITICAL CARE Performed by: Cecilio Asper   Total  critical care time: 35 minutes  Critical care time was exclusive of separately billable procedures and treating other patients.  Critical care was necessary to treat or prevent imminent or life-threatening deterioration.  Critical care was time spent personally by me on the following activities: development of treatment plan with patient and/or surrogate as well as nursing, discussions with consultants, evaluation of patient's response to treatment, examination of patient, obtaining history from patient or surrogate, ordering and performing treatments and interventions, ordering and review of laboratory studies, ordering and review of radiographic studies, pulse oximetry and re-evaluation of patient's condition.   The patients results and plan were reviewed and discussed.   Any x-rays performed were independently reviewed by myself.   Differential diagnosis were considered with the presenting HPI.  Medications  cefTRIAXone (ROCEPHIN) 2 g in sodium chloride 0.9 % 100 mL IVPB (has no administration in time range)  metroNIDAZOLE (FLAGYL) IVPB 500 mg (has no administration in time range)  sodium chloride 0.9 % bolus 1,000 mL (0 mLs Intravenous Stopped 08/25/22 1540)  sodium chloride (PF) 0.9 % injection (  Given 08/25/22 1540)  iohexol (OMNIPAQUE) 300 MG/ML solution 100 mL (100 mLs Intravenous Contrast Given 08/25/22 1539)    Vitals:   08/25/22 1430 08/25/22 1445 08/25/22 1500 08/25/22 1515  BP: (!) 158/87 (!) 154/98 (!) 151/85 (!) 155/107  Pulse: (!) 107 (!) 114 (!) 104 (!) 109  Resp: 18   18  Temp:      TempSrc:      SpO2: 97% 97% 96% 96%    Final diagnoses:  LFT elevation  Upper abdominal pain    Admission/ observation were discussed with the admitting physician, patient and/or family and they are comfortable with the plan.         Final Clinical Impression(s) / ED Diagnoses Final diagnoses:  LFT elevation  Upper abdominal pain    Rx / DC Orders ED Discharge Orders      None         Deno Etienne, DO 08/25/22 1625

## 2022-08-26 ENCOUNTER — Inpatient Hospital Stay (HOSPITAL_COMMUNITY): Payer: PPO | Admitting: Anesthesiology

## 2022-08-26 ENCOUNTER — Inpatient Hospital Stay (HOSPITAL_COMMUNITY): Payer: PPO

## 2022-08-26 ENCOUNTER — Encounter (HOSPITAL_COMMUNITY): Payer: Self-pay | Admitting: Internal Medicine

## 2022-08-26 ENCOUNTER — Encounter (HOSPITAL_COMMUNITY)
Admission: EM | Disposition: A | Discharge status: Home / Self Care | Payer: Self-pay | Source: Ambulatory Visit | Attending: Internal Medicine

## 2022-08-26 DIAGNOSIS — E785 Hyperlipidemia, unspecified: Secondary | ICD-10-CM | POA: Diagnosis not present

## 2022-08-26 DIAGNOSIS — K838 Other specified diseases of biliary tract: Secondary | ICD-10-CM | POA: Diagnosis not present

## 2022-08-26 DIAGNOSIS — K8050 Calculus of bile duct without cholangitis or cholecystitis without obstruction: Secondary | ICD-10-CM

## 2022-08-26 DIAGNOSIS — I4891 Unspecified atrial fibrillation: Secondary | ICD-10-CM

## 2022-08-26 DIAGNOSIS — I10 Essential (primary) hypertension: Secondary | ICD-10-CM | POA: Diagnosis not present

## 2022-08-26 DIAGNOSIS — D6869 Other thrombophilia: Secondary | ICD-10-CM | POA: Diagnosis not present

## 2022-08-26 DIAGNOSIS — Z87891 Personal history of nicotine dependence: Secondary | ICD-10-CM

## 2022-08-26 HISTORY — PX: SPHINCTEROTOMY: SHX5544

## 2022-08-26 HISTORY — PX: BIOPSY: SHX5522

## 2022-08-26 HISTORY — PX: ERCP: SHX5425

## 2022-08-26 LAB — COMPREHENSIVE METABOLIC PANEL
ALT: 265 U/L — ABNORMAL HIGH (ref 0–44)
AST: 137 U/L — ABNORMAL HIGH (ref 15–41)
Albumin: 3.3 g/dL — ABNORMAL LOW (ref 3.5–5.0)
Alkaline Phosphatase: 118 U/L (ref 38–126)
Anion gap: 9 (ref 5–15)
BUN: 14 mg/dL (ref 8–23)
CO2: 21 mmol/L — ABNORMAL LOW (ref 22–32)
Calcium: 8.4 mg/dL — ABNORMAL LOW (ref 8.9–10.3)
Chloride: 102 mmol/L (ref 98–111)
Creatinine, Ser: 0.56 mg/dL — ABNORMAL LOW (ref 0.61–1.24)
GFR, Estimated: >60 mL/min (ref >60)
Glucose, Bld: 122 mg/dL — ABNORMAL HIGH (ref 70–99)
Potassium: 4 mmol/L (ref 3.5–5.1)
Sodium: 132 mmol/L — ABNORMAL LOW (ref 135–145)
Total Bilirubin: 6.5 mg/dL — ABNORMAL HIGH (ref 0.3–1.2)
Total Protein: 6.2 g/dL — ABNORMAL LOW (ref 6.5–8.1)

## 2022-08-26 LAB — CBC
HCT: 37.4 % — ABNORMAL LOW (ref 39.0–52.0)
Hemoglobin: 12.6 g/dL — ABNORMAL LOW (ref 13.0–17.0)
MCH: 31.2 pg (ref 26.0–34.0)
MCHC: 33.7 g/dL (ref 30.0–36.0)
MCV: 92.6 fL (ref 80.0–100.0)
Platelets: 101 10*3/uL — ABNORMAL LOW (ref 150–400)
RBC: 4.04 MIL/uL — ABNORMAL LOW (ref 4.22–5.81)
RDW: 14.1 % (ref 11.5–15.5)
WBC: 7.2 10*3/uL (ref 4.0–10.5)
nRBC: 0 % (ref 0.0–0.2)

## 2022-08-26 LAB — GLUCOSE, CAPILLARY
Glucose-Capillary: 124 mg/dL — ABNORMAL HIGH (ref 70–99)
Glucose-Capillary: 132 mg/dL — ABNORMAL HIGH (ref 70–99)

## 2022-08-26 LAB — URINALYSIS, MICROSCOPIC ONLY

## 2022-08-26 LAB — HEPARIN LEVEL (UNFRACTIONATED): Heparin Unfractionated: 0.32 IU/mL (ref 0.30–0.70)

## 2022-08-26 LAB — APTT: aPTT: 56 seconds — ABNORMAL HIGH (ref 24–36)

## 2022-08-26 SURGERY — ERCP, WITH INTERVENTION IF INDICATED
Anesthesia: General

## 2022-08-26 MED ORDER — SUCCINYLCHOLINE CHLORIDE 200 MG/10ML IV SOSY
PREFILLED_SYRINGE | INTRAVENOUS | Status: DC | PRN
  Administered 2022-08-26: 100 mg via INTRAVENOUS

## 2022-08-26 MED ORDER — LIDOCAINE HCL (CARDIAC) PF 100 MG/5ML IV SOSY
PREFILLED_SYRINGE | INTRAVENOUS | Status: DC | PRN
  Administered 2022-08-26: 100 mg via INTRAVENOUS

## 2022-08-26 MED ORDER — FENTANYL CITRATE (PF) 100 MCG/2ML IJ SOLN
INTRAMUSCULAR | Status: DC | PRN
  Administered 2022-08-26: 100 ug via INTRAVENOUS

## 2022-08-26 MED ORDER — GLUCAGON HCL RDNA (DIAGNOSTIC) 1 MG IJ SOLR
INTRAMUSCULAR | Status: AC
  Filled 2022-08-26: qty 1

## 2022-08-26 MED ORDER — SUGAMMADEX SODIUM 200 MG/2ML IV SOLN
INTRAVENOUS | Status: DC | PRN
  Administered 2022-08-26: 200 mg via INTRAVENOUS

## 2022-08-26 MED ORDER — SODIUM CHLORIDE 0.9 % IV SOLN
INTRAVENOUS | Status: DC | PRN
  Administered 2022-08-26: 30 mL

## 2022-08-26 MED ORDER — INDOMETHACIN 50 MG RE SUPP: 100.0000 mg | Freq: Once | RECTAL | Status: DC

## 2022-08-26 MED ORDER — PROPOFOL 10 MG/ML IV BOLUS
INTRAVENOUS | Status: DC | PRN
  Administered 2022-08-26: 140 mg via INTRAVENOUS

## 2022-08-26 MED ORDER — ESMOLOL HCL 100 MG/10ML IV SOLN
INTRAVENOUS | Status: DC | PRN
  Administered 2022-08-26: 10 mg via INTRAVENOUS

## 2022-08-26 MED ORDER — PHENYLEPHRINE HCL (PRESSORS) 10 MG/ML IV SOLN
INTRAVENOUS | Status: DC | PRN
  Administered 2022-08-26: 80 ug via INTRAVENOUS
  Administered 2022-08-26: 160 ug via INTRAVENOUS
  Administered 2022-08-26: 80 ug via INTRAVENOUS

## 2022-08-26 MED ORDER — ROCURONIUM BROMIDE 100 MG/10ML IV SOLN
INTRAVENOUS | Status: DC | PRN
  Administered 2022-08-26: 20 mg via INTRAVENOUS

## 2022-08-26 MED ORDER — LACTATED RINGERS IV SOLN: INTRAVENOUS | Status: DC

## 2022-08-26 MED ORDER — DICLOFENAC SUPPOSITORY 100 MG
RECTAL | Status: AC
  Filled 2022-08-26: qty 1

## 2022-08-26 MED ORDER — DICLOFENAC SUPPOSITORY 100 MG
RECTAL | Status: DC | PRN
  Administered 2022-08-26: 100 mg via RECTAL

## 2022-08-26 MED ORDER — INDOMETHACIN 50 MG RE SUPP
RECTAL | Status: AC
  Filled 2022-08-26: qty 2

## 2022-08-26 MED ORDER — FENTANYL CITRATE (PF) 100 MCG/2ML IJ SOLN
INTRAMUSCULAR | Status: AC
  Filled 2022-08-26: qty 2

## 2022-08-26 MED ORDER — ONDANSETRON HCL 4 MG/2ML IJ SOLN
INTRAMUSCULAR | Status: DC | PRN
  Administered 2022-08-26: 4 mg via INTRAVENOUS

## 2022-08-26 NOTE — Progress Notes (Signed)
OT screen implemented and pt determined to be without current OT needs. As such, OT will sign off. Pt is pending possible surgery today and/or tomorrow; please re-consult OT post-op, as appropriate.   Leota Sauers, OTR/L

## 2022-08-26 NOTE — Op Note (Signed)
[facility] Patient Name: Edward Watson Procedure Date: 08/26/2022 MRN: [id number] Attending MD: Gatha Mayer , MD Date of Birth: Jul 24, 1946 CSN: [id number] Age: 76 Admit Type: Inpatient Procedure:                ERCP Indications:              Bile duct stone(s) Providers:                Gatha Mayer, MD, Jaci Carrel, RN, William Dalton, Technician Referring MD:              Medicines:                General Anesthesia, On ceftriaxone and                            metronidazole scheduled IV; diclofenac 100 mg per                            rectum Complications:            No immediate complications. Estimated Blood Loss:     Estimated blood loss: none. Procedure:                Pre-Anesthesia Assessment:                           - Prior to the procedure, a History and Physical                            was performed, and patient medications and                            allergies were reviewed. The patient's tolerance of                            previous anesthesia was also reviewed. The risks                            and benefits of the procedure and the sedation                            options and risks were discussed with the patient.                            All questions were answered, and informed consent                            was obtained. Prior Anticoagulants: The patient                            last took Xarelto (rivaroxaban) 2 days prior to the                            procedure and last took  heparin on the day of the                            procedure. ASA Grade Assessment: III - A patient                            with severe systemic disease. After reviewing the                            risks and benefits, the patient was deemed in                            satisfactory condition to undergo the procedure.                           After obtaining informed consent, the scope was                             passed under direct vision. Throughout the                            procedure, the patient's blood pressure, pulse, and                            oxygen saturations were monitored continuously. The                            [employer] single use                            duodenoscope was introduced through the mouth, and                            used to inject contrast into and used to inject                            contrast into the bile duct. The ERCP was                            accomplished without difficulty. The patient                            tolerated the procedure well. Scope In: Scope Out: Findings:      The scout film was normal.      The esophagus was not seen well. the stomach looked normal. The papilla       was enlarged with a mult-lobulated polypoid appearance. Bile was       draining and there were signs of mucosal trauma at the papilla ala       passed CBD stone. the biliary sphincterotome was inserted into the CBD       and a wire passed deeply. Contrast injected - mildly dilated CBD/CHD and       intrahepatics. Maximum size 12-13 mm. No sign of stone but given  scenario and stone on MRCP sphincterotomy and balloon swweps performed +       occlusion cholangiogram. No stones seen or removed. balloon removed.       Biopsies of the abnormal papilla were taken x 6.      Pancreas not entered by intent. Impression:               - The entire biliary tree was mildly dilated,                            acquired. Stone seen on MRCP had passed.                           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 Moderate Sedation:      Not Applicable - Patient had care per Anesthesia. Recommendation:           - Clear liquids now and NPO after midnight in                            anticipation of lap chole.                           Would hold  heparin for now and until after surgery                            unless delayed.                           I will f/u ampulla pathology - ? inflammatory vs                            neoplastic changes. Procedure Code(s):        --- Professional ---                           [id number], Endoscopic retrograde                            cholangiopancreatography (ERCP); with                            sphincterotomy/papillotomy                           [id number], Endoscopic retrograde                            cholangiopancreatography (ERCP); with biopsy,                            single or multiple Diagnosis Code(s):        --- Professional ---                           K80.50, Calculus of  bile duct without cholangitis                            or cholecystitis without obstruction                           K83.8, Other specified diseases of biliary tract CPT copyright 2019 [employer]. All rights reserved. The codes documented in this report are preliminary and upon coder review may  be revised to meet current compliance requirements. Gatha Mayer, MD 08/26/2022 2:17:20 PM This report has been signed electronically. Number of Addenda: 0

## 2022-08-26 NOTE — Progress Notes (Signed)
PROGRESS NOTE    Edward Watson  TDV:[id number] DOB: [birth year] DOA: 08/25/2022 PCP: [name], MD   Brief Narrative:  Edward Watson is a 76 y.o. male with medical history significant of permanent atrial fibrillation, CAD, dyslipidemia, ED, hyperlipidemia, hypertension, obesity, OSA, sleep apnea, history of tobacco abuse disorder as well as other comorbidities who presented to the hospital with abdominal pain and jaundice.  Patient was evaluated by his PCP earlier for his abdominal pain and was noticed having dark urine eyes and a yellowish hue.  Patient states abdominal pain started a few days ago and noticed acute onset and he also had some pain in his lower back and was unable to get comfortable due to his abdominal discomfort.  He stated that he had fevers and chills last few days and noticed yellowing of the skin and eyes at last night with a very dark color urine.  He had some chills and denies any chest pain or shortness of breath.  Because of this he was sent to the ED directly from his PCP office and patient states that he had upper abdominal pain were started from the left side and radiated to the right.  He felt as if his abdominal wall muscles were very "sore" did not really want to eat.  Given his abnormal bilirubin he was sent to the ED for further evaluation and [facility] was consulted for admission with GI consultation and general surgery consultation given that he was found to have choledocholithiasis on imaging.  Currently he states his abdominal pain is resolved denied dysuria but did notice dark-colored urine, chest pain or shortness of breath.  **Interim History Gastroenterology evaluated and plan is for ERCP today.  Heparin gtt stopped At 9 AM.  Hemoglobin dropped slightly but enzymes are improving as well as T. Bili.  Pending that his ER CP level he is tentatively being planned for lap chole tomorrow morning and will be n.p.o. after midnight and because he is  going for surgical intervention tomorrow we will hold his heparin drip through the night as well.  Assessment and Plan:  Abdominal Pain in the Setting of Obstructive Jaundice from Chloledocholithasis with questionable acute cholecystitis Abnormal LFTs, improving  Hyperbilirubinemia -Patient presents with worsening abdominal pain and jaundice that was evaluated by his PCP and he was sent to the ED for further evaluation -Last dose of his anticoagulation with Xarelto was yesterday and plan is for ERCP eventually with sphincterotomy -CT scan of the abdomen pelvis done and showed "There is mild central intrahepatic biliary ductal dilatation and dilatation of the common bile duct. There is internal calcific material within the distal common bile duct at the level of the pancreatic head, most compatible with choledocholithiasis. Consider further evaluation with MRCP. The gallbladder is mildly distended with suggestion of mild wall thickening and surrounding fat stranding, nonspecific. Mild/early cholecystitis is not excluded. Consider further evaluation with right upper quadrant ultrasound. Cholelithiasis." -RUQ U/S done and showed "Cholelithiasis, without associated sonographic findings to suggest acute cholecystitis. Hepatic steatosis." -LFTs done and showed an AST of 162, ALT of 340, total bilirubin of 7.5 on Admision -Now AST is now 137, ALT -Hold Xarelto and changed to heparin drip -GI consulted and they feel that he is convincing evidence for choledocholithiasis without needing an MRCP and they are going to proceed for an ERCP after the Eliquis was washed out. -Continuing IV ceftriaxone and Flagyl for possible acute cholecystitis -We will make him n.p.o. after midnight for possible endoscopic  procedure -Hold Aleve to 20 mg p.o. daily -General surgery also consulted and they are recommending GI evaluation once his liver functions have normalized and he has a ERCP done there recommending that he has  this gallbladder removed during his hospitalization -ERCP to be done today and lap chole to be done tomorrow   Permanent Atrial Fibrillation with RVR -Continue monitor on telemetry -Changed Xarelto to heparin drip and continue to hold oral anticoagulation and will hold his heparin drip tonight for surgical intervention tomorrow -Getting IV fluid hydration given that he had some RVR   Acquired Thrombophilia -In the setting of permanent atrial fibrillation -Continue with anticoagulation as above   Essential Hypertension -Continue with home antihypertensives with p.o. verapamil to 40 mg p.o. nightly -Continue monitor blood pressures per protocol -Last blood pressure reading was 162/75   Hyperlipidemia -Holding atorvastatin 40 mg p.o. daily as well as niacin 1 tab p.o. as directed low-fat snack and aspirin 30 minutes prior to taking medication   Hyponatremia -Mild at 134 and dropped to 132 -Patient was started on IV fluid hydration with normal saline at 75 MLS per hour and will continue for now -Continue to monitor and trend and repeat CMP in a.m.  Metabolic Acidosis -Mild and patient's CO2 is now 21, anion gap is 9, chloride level is 102 -Continue to monitor and trend and repeat CMP in a.m.   Hyperglycemia -Mild and likely active -Continue monitor CBGs per protocol -Check hemoglobin A1c and was 5.3 -If necessary will place on sensitive NovoLog sign scale insulin AC  Hypoalbuminemia -The patient's albumin level went from 4.1 is now 3.3 -Continue monitor trend and repeat CMP in a.m.   Hypokalemia -Mild at 3.4 and improved to 4.0 -Check magnesium level in the a.m. -Continue monitor and replete as necessary and repeat CMP in a.m.  Normocytic Anemia -The patient's hemoglobin/hematocrit went from 13.6/40.9 and dropped to 12.6/37.4 and could be dilutional drop with IV fluid resuscitation -Check anemia panel in a.m. -Continue to monitor for signs and symptoms of bleeding while  patient was anticoagulated; no overt bleeding noted -Repeat CBC in a.m.   Thrombocytopenia -Patient takes anticoagulation and currently his Xarelto is being held and will change to IV heparin and will need to monitor for signs and symptoms bleeding -Patient's platelet count is now 125 -> 101 -Repeat CBC in a.m.   OSA -C/w CPAP qHS   Obesity -Complicates overall prognosis and care -Estimated body mass index is 37.38 kg/m as calculated from the following:   Height as of this encounter: '6\' 1"'$  (1.854 m).   Weight as of this encounter: 128.5 kg.  -Weight Loss and Dietary Counseling given  DVT prophylaxis: SCDs Start: 08/25/22 1913    Code Status: Full Code Family Communication: Discussed with family at bedside  Disposition Plan:  Level of care: Telemetry Status is: Inpatient Remains inpatient appropriate because: Needs further clinical work-up and is going for ERCP today and likely surgical intervention tomorrow   Consultants:  Gastroenterology General Surgery   Procedures:  As above ERCP  Antimicrobials:  Anti-infectives (From admission, onward)    Start     Dose/Rate Route Frequency Ordered Stop   08/26/22 1700  [MAR Hold]  cefTRIAXone (ROCEPHIN) 2 g in sodium chloride 0.9 % 100 mL IVPB        (MAR Hold since Tue 08/26/2022 at 1225.Hold Reason: Transfer to a Procedural area)   2 g 200 mL/hr over 30 Minutes Intravenous Every 24 hours 08/25/22 1911  08/25/22 1630  cefTRIAXone (ROCEPHIN) 2 g in sodium chloride 0.9 % 100 mL IVPB        2 g 200 mL/hr over 30 Minutes Intravenous  Once 08/25/22 1623 08/25/22 1738   08/25/22 1630  [MAR Hold]  metroNIDAZOLE (FLAGYL) IVPB 500 mg        (MAR Hold since Tue 08/26/2022 at 1225.Hold Reason: Transfer to a Procedural area)   500 mg 100 mL/hr over 60 Minutes Intravenous Every 12 hours 08/25/22 1623         Subjective: Seen and examined at bedside and patient was doing okay but states he was hungry.  No nausea or vomiting.  Denies  any abdominal pain today and feels better.  Objective: Vitals:   08/26/22 0500 08/26/22 0636 08/26/22 1216 08/26/22 1227  BP:  137/78 136/83 (!) 162/75  Pulse:  88 98 (!) 59  Resp:  20 (!) 22 (!) 25  Temp:  99.3 F (37.4 C) 98.8 F (37.1 C)   TempSrc:  Oral Oral   SpO2:  95% 94% 96%  Weight: 128.5 kg     Height:        Intake/Output Summary (Last 24 hours) at 08/26/2022 1253 Last data filed at 08/26/2022 0600 Gross per 24 hour  Intake 2036.07 ml  Output --  Net 2036.07 ml   Filed Weights   08/25/22 2100 08/26/22 0500  Weight: 127.6 kg 128.5 kg   Examination: Physical Exam:  Constitutional: WN/WD obese elderly Caucasian male, in no acute distress Respiratory: Diminished to auscultation bilaterally with coarse breath sounds, no wheezing, rales, rhonchi or crackles. Normal respiratory effort and patient is not tachypenic. No accessory muscle use.  Unlabored breathing Cardiovascular: RRR, no murmurs / rubs / gallops. S1 and S2 auscultated.  Trace extremity edema Abdomen: Soft, non-tender, distended second body habitus. Bowel sounds positive.  GU: Deferred. Musculoskeletal: No clubbing / cyanosis of digits/nails. No joint deformity upper and lower extremities. Skin: No rashes, lesions, ulcers on a limited skin evaluation. No induration; Warm and dry.  Neurologic: CN 2-12 grossly intact with no focal deficits. Romberg sign and cerebellar reflexes not assessed.  Psychiatric: Normal judgment and insight. Alert and oriented x 3. Normal mood and appropriate affect.   Data Reviewed: I have personally reviewed following labs and imaging studies  CBC: Recent Labs  Lab 08/25/22 1409 08/26/22 0544  WBC 9.3 7.2  NEUTROABS 7.9*  --   HGB 13.6 12.6*  HCT 40.9 37.4*  MCV 92.3 92.6  PLT 125* 160*   Basic Metabolic Panel: Recent Labs  Lab 08/25/22 1409 08/26/22 0544  NA 134* 132*  K 3.4* 4.0  CL 100 102  CO2 24 21*  GLUCOSE 117* 122*  BUN 12 14  CREATININE 0.64 0.56*   CALCIUM 9.0 8.4*   GFR: Estimated Creatinine Clearance: 113.8 mL/min (A) (by C-G formula based on SCr of 0.56 mg/dL (L)). Liver Function Tests: Recent Labs  Lab 08/25/22 1409 08/26/22 0544  AST 162* 137*  ALT 340* 265*  ALKPHOS 117 118  BILITOT 7.5* 6.5*  PROT 7.1 6.2*  ALBUMIN 4.1 3.3*   Recent Labs  Lab 08/25/22 1706  LIPASE 40   No results for input(s): "AMMONIA" in the last 168 hours. Coagulation Profile: Recent Labs  Lab 08/25/22 1409  INR 1.3*   Cardiac Enzymes: No results for input(s): "CKTOTAL", "CKMB", "CKMBINDEX", "TROPONINI" in the last 168 hours. BNP (last 3 results) No results for input(s): "PROBNP" in the last 8760 hours. HbA1C: Recent Labs  08/25/22 1949  HGBA1C 5.3   CBG: No results for input(s): "GLUCAP" in the last 168 hours. Lipid Profile: No results for input(s): "CHOL", "HDL", "LDLCALC", "TRIG", "CHOLHDL", "LDLDIRECT" in the last 72 hours. Thyroid Function Tests: Recent Labs    08/25/22 1949  TSH 0.805   Anemia Panel: No results for input(s): "VITAMINB12", "FOLATE", "FERRITIN", "TIBC", "IRON", "RETICCTPCT" in the last 72 hours. Sepsis Labs: No results for input(s): "PROCALCITON", "LATICACIDVEN" in the last 168 hours.  Recent Results (from the past 240 hour(s))  SARS Coronavirus 2 by RT PCR (hospital order, performed in [facility] hospital lab) *cepheid single result test* Anterior Nasal Swab     Status: None   Collection Time: 08/25/22  5:05 PM   Specimen: Anterior Nasal Swab  Result Value Ref Range Status   SARS Coronavirus 2 by RT PCR NEGATIVE NEGATIVE Final    Comment: (NOTE) SARS-CoV-2 target nucleic acids are NOT DETECTED.  The SARS-CoV-2 RNA is generally detectable in upper and lower respiratory specimens during the acute phase of infection. The lowest concentration of SARS-CoV-2 viral copies this assay can detect is 250 copies / mL. A negative result does not preclude SARS-CoV-2 infection and should not be used as the  sole basis for treatment or other patient management decisions.  A negative result may occur with improper specimen collection / handling, submission of specimen other than nasopharyngeal swab, presence of viral mutation(s) within the areas targeted by this assay, and inadequate number of viral copies (<250 copies / mL). A negative result must be combined with clinical observations, patient history, and epidemiological information.  Fact Sheet for Patients:   https://www.patel.info/  Fact Sheet for Healthcare Providers: https://hall.com/  This test is not yet approved or  cleared by the [place] FDA and has been authorized for detection and/or diagnosis of SARS-CoV-2 by FDA under an Emergency Use Authorization (EUA).  This EUA will remain in effect (meaning this test can be used) for the duration of the COVID-19 declaration under Section 564(b)(1) of the Act, 21 U.S.C. section 360bbb-3(b)(1), unless the authorization is terminated or revoked sooner.  Performed at [facility], [place] [address] [place], [place] [address]     Radiology Studies: US Abdomen Limited RUQ (LIVER/GB)  Result Date: 08/25/2022 CLINICAL DATA:  Abnormal LFTs EXAM: ULTRASOUND ABDOMEN LIMITED RIGHT UPPER QUADRANT COMPARISON:  CT abdomen/pelvis dated 08/25/2022 FINDINGS: Gallbladder: Layering 2.5 cm gallstone. No gallbladder wall thickening or pericholecystic fluid. Negative sonographic Murphy's sign. Common bile duct: Diameter: 3 mm Liver: Echogenic hepatic parenchyma, suggesting hepatic steatosis. No focal hepatic lesion is seen. Portal vein is patent on color Doppler imaging with normal direction of blood flow towards the liver. Other: None. IMPRESSION: Cholelithiasis, without associated sonographic findings to suggest acute cholecystitis. Hepatic steatosis. Electronically Signed   By: Julian Hy M.D.   On: 08/25/2022 17:33   CT ABDOMEN PELVIS  W CONTRAST  Result Date: 08/25/2022 CLINICAL DATA:  Acute hepatitis. EXAM: CT ABDOMEN AND PELVIS WITH CONTRAST TECHNIQUE: Multidetector CT imaging of the abdomen and pelvis was performed using the standard protocol following bolus administration of intravenous contrast. RADIATION DOSE REDUCTION: This exam was performed according to the departmental dose-optimization program which includes automated exposure control, adjustment of the mA and/or kV according to patient size and/or use of iterative reconstruction technique. CONTRAST:  131m OMNIPAQUE IOHEXOL 300 MG/ML  SOLN COMPARISON:  Chest CT 08/24/2013 FINDINGS: Lower chest: Normal heart size. Coronary arterial vascular calcifications. Dependent atelectasis within the bilateral lower lobes. Hepatobiliary: The liver  is normal in size and contour. No focal hepatic lesion is identified. Gallbladder is mildly distended. Cholelithiasis. Mild central intrahepatic biliary ductal dilatation. Common bile duct measures 10 mm. At the level of the distal common bile duct in the region of the pancreatic head there is internal calcific material (image 40; series 2). Pancreas: Unremarkable Spleen: Unremarkable Adrenals/Urinary Tract: Normal adrenal glands. Kidneys are symmetric in size. There is a partially exophytic 1.3 cm cyst off the superior pole of the right kidney. No hydronephrosis. Urinary bladder is unremarkable. Stomach/Bowel: Descending and sigmoid colonic diverticulosis. No CT evidence for acute diverticulitis. The appendix is normal. No evidence for bowel obstruction. No free fluid or free intraperitoneal air. Normal morphology of the stomach. Vascular/Lymphatic: Normal caliber abdominal aorta. Peripheral calcified atherosclerotic plaque. No retroperitoneal lymphadenopathy. Reproductive: Hernia. Other: Small fat containing right inguinal hernia. Musculoskeletal: Lumbar spine degenerative changes. No aggressive or acute appearing osseous lesions. IMPRESSION: 1.  There is mild central intrahepatic biliary ductal dilatation and dilatation of the common bile duct. There is internal calcific material within the distal common bile duct at the level of the pancreatic head, most compatible with choledocholithiasis. Consider further evaluation with MRCP. 2. The gallbladder is mildly distended with suggestion of mild wall thickening and surrounding fat stranding, nonspecific. Mild/early cholecystitis is not excluded. Consider further evaluation with right upper quadrant ultrasound. 3. Cholelithiasis. Electronically Signed   By: Lovey Newcomer M.D.   On: 08/25/2022 15:52   \ Scheduled Meds:  [MAR Hold] ascorbic acid  500 mg Oral Daily   [MAR Hold] indomethacin  100 mg Rectal Once   [MAR Hold] multivitamin with minerals  1 tablet Oral Q breakfast   [MAR Hold] potassium chloride  40 mEq Oral BID   [MAR Hold] verapamil  240 mg Oral QHS   Continuous Infusions:  sodium chloride 75 mL/hr at 08/26/22 1148   [MAR Hold] cefTRIAXone (ROCEPHIN)  IV     lactated ringers 10 mL/hr at 08/26/22 1234   [MAR Hold] metronidazole 500 mg (08/26/22 0418)    LOS: 1 day   Raiford Noble, DO [facility] Available via Epic secure chat 7am-7pm After these hours, please refer to coverage provider listed on [email or web address] 08/26/2022, 12:53 PM

## 2022-08-26 NOTE — Plan of Care (Signed)

## 2022-08-26 NOTE — H&P (View-Only) (Signed)
[facility] Progress Note  Day of Surgery  Subjective: CC-  Scheduled for ERCP today. LFTs are trending down. States that he feels much better. Denies any abdominal pain.  Objective: Vital signs in last 24 hours: Temp:  [97.8 F (36.6 C)-99.6 F (37.6 C)] 99.3 F (37.4 C) (10/10 0636) Pulse Rate:  [67-114] 88 (10/10 0636) Resp:  [18-20] 20 (10/10 0636) BP: (123-170)/(72-107) 137/78 (10/10 0636) SpO2:  [93 %-97 %] 95 % (10/10 0636) Weight:  [127.6 kg-128.5 kg] 128.5 kg (10/10 0500)    Intake/Output from previous day: 10/09 0701 - 10/10 0700 In: 2036.1 [I.V.:734.4; IV Piggyback:1301.7] Out: -  Intake/Output this shift: No intake/output data recorded.  PE: Gen:  Alert, NAD Abd: soft, nontender  Lab Results:  Recent Labs    08/25/22 1409 08/26/22 0544  WBC 9.3 7.2  HGB 13.6 12.6*  HCT 40.9 37.4*  PLT 125* 101*   BMET Recent Labs    08/25/22 1409 08/26/22 0544  NA 134* 132*  K 3.4* 4.0  CL 100 102  CO2 24 21*  GLUCOSE 117* 122*  BUN 12 14  CREATININE 0.64 0.56*  CALCIUM 9.0 8.4*   PT/INR Recent Labs    08/25/22 1409  LABPROT 15.7*  INR 1.3*   CMP     Component Value Date/Time   NA 132 (L) 08/26/2022 0544   NA 143 05/28/2020 1543   K 4.0 08/26/2022 0544   CL 102 08/26/2022 0544   CO2 21 (L) 08/26/2022 0544   GLUCOSE 122 (H) 08/26/2022 0544   BUN 14 08/26/2022 0544   BUN 10 05/28/2020 1543   CREATININE 0.56 (L) 08/26/2022 0544   CREATININE 0.76 10/31/2015 0843   CALCIUM 8.4 (L) 08/26/2022 0544   PROT 6.2 (L) 08/26/2022 0544   PROT 6.7 05/28/2020 1543   ALBUMIN 3.3 (L) 08/26/2022 0544   ALBUMIN 4.3 05/28/2020 1543   AST 137 (H) 08/26/2022 0544   ALT 265 (H) 08/26/2022 0544   ALKPHOS 118 08/26/2022 0544   BILITOT 6.5 (H) 08/26/2022 0544   BILITOT 0.7 05/28/2020 1543   GFRNONAA >60 08/26/2022 0544   GFRNONAA >89 10/27/2014 1051   GFRAA 107 05/28/2020 1543   GFRAA >89 10/27/2014 1051   Lipase     Component Value Date/Time    LIPASE 40 08/25/2022 1706       Studies/Results: US Abdomen Limited RUQ (LIVER/GB)  Result Date: 08/25/2022 CLINICAL DATA:  Abnormal LFTs EXAM: ULTRASOUND ABDOMEN LIMITED RIGHT UPPER QUADRANT COMPARISON:  CT abdomen/pelvis dated 08/25/2022 FINDINGS: Gallbladder: Layering 2.5 cm gallstone. No gallbladder wall thickening or pericholecystic fluid. Negative sonographic Murphy's sign. Common bile duct: Diameter: 3 mm Liver: Echogenic hepatic parenchyma, suggesting hepatic steatosis. No focal hepatic lesion is seen. Portal vein is patent on color Doppler imaging with normal direction of blood flow towards the liver. Other: None. IMPRESSION: Cholelithiasis, without associated sonographic findings to suggest acute cholecystitis. Hepatic steatosis. Electronically Signed   By: Julian Hy M.D.   On: 08/25/2022 17:33   CT ABDOMEN PELVIS W CONTRAST  Result Date: 08/25/2022 CLINICAL DATA:  Acute hepatitis. EXAM: CT ABDOMEN AND PELVIS WITH CONTRAST TECHNIQUE: Multidetector CT imaging of the abdomen and pelvis was performed using the standard protocol following bolus administration of intravenous contrast. RADIATION DOSE REDUCTION: This exam was performed according to the departmental dose-optimization program which includes automated exposure control, adjustment of the mA and/or kV according to patient size and/or use of iterative reconstruction technique. CONTRAST:  177m OMNIPAQUE IOHEXOL 300 MG/ML  SOLN COMPARISON:  Chest CT 08/24/2013 FINDINGS: Lower chest: Normal heart size. Coronary arterial vascular calcifications. Dependent atelectasis within the bilateral lower lobes. Hepatobiliary: The liver is normal in size and contour. No focal hepatic lesion is identified. Gallbladder is mildly distended. Cholelithiasis. Mild central intrahepatic biliary ductal dilatation. Common bile duct measures 10 mm. At the level of the distal common bile duct in the region of the pancreatic head there is internal calcific  material (image 40; series 2). Pancreas: Unremarkable Spleen: Unremarkable Adrenals/Urinary Tract: Normal adrenal glands. Kidneys are symmetric in size. There is a partially exophytic 1.3 cm cyst off the superior pole of the right kidney. No hydronephrosis. Urinary bladder is unremarkable. Stomach/Bowel: Descending and sigmoid colonic diverticulosis. No CT evidence for acute diverticulitis. The appendix is normal. No evidence for bowel obstruction. No free fluid or free intraperitoneal air. Normal morphology of the stomach. Vascular/Lymphatic: Normal caliber abdominal aorta. Peripheral calcified atherosclerotic plaque. No retroperitoneal lymphadenopathy. Reproductive: Hernia. Other: Small fat containing right inguinal hernia. Musculoskeletal: Lumbar spine degenerative changes. No aggressive or acute appearing osseous lesions. IMPRESSION: 1. There is mild central intrahepatic biliary ductal dilatation and dilatation of the common bile duct. There is internal calcific material within the distal common bile duct at the level of the pancreatic head, most compatible with choledocholithiasis. Consider further evaluation with MRCP. 2. The gallbladder is mildly distended with suggestion of mild wall thickening and surrounding fat stranding, nonspecific. Mild/early cholecystitis is not excluded. Consider further evaluation with right upper quadrant ultrasound. 3. Cholelithiasis. Electronically Signed   By: Lovey Newcomer M.D.   On: 08/25/2022 15:52    Anti-infectives: Anti-infectives (From admission, onward)    Start     Dose/Rate Route Frequency Ordered Stop   08/26/22 1700  cefTRIAXone (ROCEPHIN) 2 g in sodium chloride 0.9 % 100 mL IVPB        2 g 200 mL/hr over 30 Minutes Intravenous Every 24 hours 08/25/22 1911     08/25/22 1630  cefTRIAXone (ROCEPHIN) 2 g in sodium chloride 0.9 % 100 mL IVPB        2 g 200 mL/hr over 30 Minutes Intravenous  Once 08/25/22 1623 08/25/22 1738   08/25/22 1630  metroNIDAZOLE  (FLAGYL) IVPB 500 mg        500 mg 100 mL/hr over 60 Minutes Intravenous Every 12 hours 08/25/22 1623          Assessment/Plan Choledocholithiasis  Hyperbilirubinemia - Patient is scheduled for ERCP today. Pending this goes well and he has no complications we will tentatively plan for lap chole tomorrow. Repeat labs in AM. NPO after midnight. Will need to hold heparin gtt about 6 hours prior to surgery. Continue to hold xarelto.  ID - rocephin/flagyl 10/9>> FEN - IVF, NPO VTE - heparin gtt Foley - none  A fib on xarelto (holding) CAD HLD HTN Obesity BMI 37.38 OSA Thrombocytopenia  I reviewed Consultant gastroenterology notes, hospitalist notes, last 24 h vitals and pain scores, last 48 h intake and output, last 24 h labs and trends, and last 24 h imaging results.    LOS: 1 day    [facility] 08/26/2022, 9:05 AM Please see Amion for pager number during day hours 7:00am-4:30pm

## 2022-08-26 NOTE — Anesthesia Postprocedure Evaluation (Signed)
Anesthesia Post Note  Patient: Edward Watson  Procedure(s) Performed: ENDOSCOPIC RETROGRADE CHOLANGIOPANCREATOGRAPHY (ERCP) BIOPSY SPHINCTEROTOMY REMOVAL OF STONES     Patient location during evaluation: PACU Anesthesia Type: General Level of consciousness: sedated Pain management: pain level controlled Vital Signs Assessment: post-procedure vital signs reviewed and stable Respiratory status: spontaneous breathing and respiratory function stable Cardiovascular status: stable Postop Assessment: no apparent nausea or vomiting Anesthetic complications: no   No notable events documented.  Last Vitals:  Vitals:   08/26/22 1430 08/26/22 1440  BP: 124/85 (!) 148/77  Pulse: (!) 117 98  Resp: (!) 26 20  Temp:    SpO2: 96% 94%    Last Pain:  Vitals:   08/26/22 1440  TempSrc:   PainSc: 0-No pain                 Celesta Funderburk DANIEL

## 2022-08-26 NOTE — Evaluation (Signed)
Physical Therapy Evaluation Patient Details Name: Edward Watson MRN: [id number] DOB: 11/25/1945 Today's Date: 08/26/2022  History of Present Illness  76 yo male admitted with choledocholithiasis, obstructive jaundice. Hx of CAD, Afib, OSA, obesity  Clinical Impression  On eval, pt is Ind with mobility. He has been mobilizing in his room unassisted and without difficulty. He is planning to have a procedure today and possibly surgery on Wednesday. Will plan to re-eval on Thursday post-op. Thanks.        Recommendations for follow up therapy are one component of a multi-disciplinary discharge planning process, led by the attending physician.  Recommendations may be updated based on patient status, additional functional criteria and insurance authorization.  Follow Up Recommendations No PT follow up      Assistance Recommended at Discharge None  Patient can return home with the following       Equipment Recommendations None recommended by PT  Recommendations for Other Services       Functional Status Assessment Patient has not had a recent decline in their functional status (currently; will reasses post op)     Precautions / Restrictions Precautions Precautions: None Restrictions Weight Bearing Restrictions: No      Mobility  Bed Mobility               General bed mobility comments: sitting EOB    Transfers Overall transfer level: Independent                      Ambulation/Gait Ambulation/Gait assistance: Independent Gait Distance (Feet): 20 Feet           General Gait Details: Pt walked around the room. No LOB. No difficulty  Stairs            Wheelchair Mobility    Modified Rankin (Stroke Patients Only)       Balance Overall balance assessment: Independent                                           Pertinent Vitals/Pain Pain Assessment Pain Assessment: No/denies pain    Home Living Family/patient  expects to be discharged to:: Private residence Living Arrangements: Alone               Home Equipment: None      Prior Function Prior Level of Function : Independent/Modified Independent                     Hand Dominance        Extremity/Trunk Assessment   Upper Extremity Assessment Upper Extremity Assessment: Overall WFL for tasks assessed    Lower Extremity Assessment Lower Extremity Assessment: Overall WFL for tasks assessed    Cervical / Trunk Assessment Cervical / Trunk Assessment: Normal  Communication   Communication: No difficulties  Cognition Arousal/Alertness: Awake/alert Behavior During Therapy: WFL for tasks assessed/performed Overall Cognitive Status: Within Functional Limits for tasks assessed                                          General Comments      Exercises     Assessment/Plan    PT Assessment Patient needs continued PT services (re-eval post op (surgery planned for Wed))  PT Problem List  PT Treatment Interventions Gait training;Therapeutic activities;Patient/family education;Functional mobility training;Therapeutic exercise;Balance training;DME instruction    PT Goals (Current goals can be found in the Care Plan section)  Acute Rehab PT Goals Patient Stated Goal: to feel better PT Goal Formulation: All assessment and education complete, DC therapy    Frequency Min 3X/week     Co-evaluation               AM-PAC PT "6 Clicks" Mobility  Outcome Measure Help needed turning from your back to your side while in a flat bed without using bedrails?: None Help needed moving from lying on your back to sitting on the side of a flat bed without using bedrails?: None Help needed moving to and from a bed to a chair (including a wheelchair)?: None Help needed standing up from a chair using your arms (e.g., wheelchair or bedside chair)?: None Help needed to walk in hospital room?: None Help needed  climbing 3-5 steps with a railing? : None 6 Click Score: 24    End of Session   Activity Tolerance: Patient tolerated treatment well Patient left: in bed;with call bell/phone within reach;with family/visitor present (sitting EOB)        Time: 1135-1141 PT Time Calculation (min) (ACUTE ONLY): 6 min   Charges:   PT Evaluation $PT Eval Low Complexity: [place], PT Acute Rehabilitation  Office: [phone] Pager: [phone]

## 2022-08-26 NOTE — Progress Notes (Signed)
  Transition of Care [facility]) Screening Note   Patient Details  Name: Edward Watson Date of Birth: [birth year]   Transition [facility] Care [facility]) CM/SW Contact:    Vassie Moselle, LCSW Phone Number: 08/26/2022, 1:17 PM    Transition of Care Department [facility]) has reviewed patient and no TOC needs have been identified at this time. We will continue to monitor patient advancement through interdisciplinary progression rounds. If new patient transition needs arise, please place a TOC consult.

## 2022-08-26 NOTE — Progress Notes (Signed)
[facility] Progress Note  Day of Surgery  Subjective: CC-  Scheduled for ERCP today. LFTs are trending down. States that he feels much better. Denies any abdominal pain.  Objective: Vital signs in last 24 hours: Temp:  [97.8 F (36.6 C)-99.6 F (37.6 C)] 99.3 F (37.4 C) (10/10 0636) Pulse Rate:  [67-114] 88 (10/10 0636) Resp:  [18-20] 20 (10/10 0636) BP: (123-170)/(72-107) 137/78 (10/10 0636) SpO2:  [93 %-97 %] 95 % (10/10 0636) Weight:  [127.6 kg-128.5 kg] 128.5 kg (10/10 0500)    Intake/Output from previous day: 10/09 0701 - 10/10 0700 In: 2036.1 [I.V.:734.4; IV Piggyback:1301.7] Out: -  Intake/Output this shift: No intake/output data recorded.  PE: Gen:  Alert, NAD Abd: soft, nontender  Lab Results:  Recent Labs    08/25/22 1409 08/26/22 0544  WBC 9.3 7.2  HGB 13.6 12.6*  HCT 40.9 37.4*  PLT 125* 101*   BMET Recent Labs    08/25/22 1409 08/26/22 0544  NA 134* 132*  K 3.4* 4.0  CL 100 102  CO2 24 21*  GLUCOSE 117* 122*  BUN 12 14  CREATININE 0.64 0.56*  CALCIUM 9.0 8.4*   PT/INR Recent Labs    08/25/22 1409  LABPROT 15.7*  INR 1.3*   CMP     Component Value Date/Time   NA 132 (L) 08/26/2022 0544   NA 143 05/28/2020 1543   K 4.0 08/26/2022 0544   CL 102 08/26/2022 0544   CO2 21 (L) 08/26/2022 0544   GLUCOSE 122 (H) 08/26/2022 0544   BUN 14 08/26/2022 0544   BUN 10 05/28/2020 1543   CREATININE 0.56 (L) 08/26/2022 0544   CREATININE 0.76 10/31/2015 0843   CALCIUM 8.4 (L) 08/26/2022 0544   PROT 6.2 (L) 08/26/2022 0544   PROT 6.7 05/28/2020 1543   ALBUMIN 3.3 (L) 08/26/2022 0544   ALBUMIN 4.3 05/28/2020 1543   AST 137 (H) 08/26/2022 0544   ALT 265 (H) 08/26/2022 0544   ALKPHOS 118 08/26/2022 0544   BILITOT 6.5 (H) 08/26/2022 0544   BILITOT 0.7 05/28/2020 1543   GFRNONAA >60 08/26/2022 0544   GFRNONAA >89 10/27/2014 1051   GFRAA 107 05/28/2020 1543   GFRAA >89 10/27/2014 1051   Lipase     Component Value Date/Time    LIPASE 40 08/25/2022 1706       Studies/Results: US Abdomen Limited RUQ (LIVER/GB)  Result Date: 08/25/2022 CLINICAL DATA:  Abnormal LFTs EXAM: ULTRASOUND ABDOMEN LIMITED RIGHT UPPER QUADRANT COMPARISON:  CT abdomen/pelvis dated 08/25/2022 FINDINGS: Gallbladder: Layering 2.5 cm gallstone. No gallbladder wall thickening or pericholecystic fluid. Negative sonographic Murphy's sign. Common bile duct: Diameter: 3 mm Liver: Echogenic hepatic parenchyma, suggesting hepatic steatosis. No focal hepatic lesion is seen. Portal vein is patent on color Doppler imaging with normal direction of blood flow towards the liver. Other: None. IMPRESSION: Cholelithiasis, without associated sonographic findings to suggest acute cholecystitis. Hepatic steatosis. Electronically Signed   By: Julian Hy M.D.   On: 08/25/2022 17:33   CT ABDOMEN PELVIS W CONTRAST  Result Date: 08/25/2022 CLINICAL DATA:  Acute hepatitis. EXAM: CT ABDOMEN AND PELVIS WITH CONTRAST TECHNIQUE: Multidetector CT imaging of the abdomen and pelvis was performed using the standard protocol following bolus administration of intravenous contrast. RADIATION DOSE REDUCTION: This exam was performed according to the departmental dose-optimization program which includes automated exposure control, adjustment of the mA and/or kV according to patient size and/or use of iterative reconstruction technique. CONTRAST:  17m OMNIPAQUE IOHEXOL 300 MG/ML  SOLN COMPARISON:  Chest CT 08/24/2013 FINDINGS: Lower chest: Normal heart size. Coronary arterial vascular calcifications. Dependent atelectasis within the bilateral lower lobes. Hepatobiliary: The liver is normal in size and contour. No focal hepatic lesion is identified. Gallbladder is mildly distended. Cholelithiasis. Mild central intrahepatic biliary ductal dilatation. Common bile duct measures 10 mm. At the level of the distal common bile duct in the region of the pancreatic head there is internal calcific  material (image 40; series 2). Pancreas: Unremarkable Spleen: Unremarkable Adrenals/Urinary Tract: Normal adrenal glands. Kidneys are symmetric in size. There is a partially exophytic 1.3 cm cyst off the superior pole of the right kidney. No hydronephrosis. Urinary bladder is unremarkable. Stomach/Bowel: Descending and sigmoid colonic diverticulosis. No CT evidence for acute diverticulitis. The appendix is normal. No evidence for bowel obstruction. No free fluid or free intraperitoneal air. Normal morphology of the stomach. Vascular/Lymphatic: Normal caliber abdominal aorta. Peripheral calcified atherosclerotic plaque. No retroperitoneal lymphadenopathy. Reproductive: Hernia. Other: Small fat containing right inguinal hernia. Musculoskeletal: Lumbar spine degenerative changes. No aggressive or acute appearing osseous lesions. IMPRESSION: 1. There is mild central intrahepatic biliary ductal dilatation and dilatation of the common bile duct. There is internal calcific material within the distal common bile duct at the level of the pancreatic head, most compatible with choledocholithiasis. Consider further evaluation with MRCP. 2. The gallbladder is mildly distended with suggestion of mild wall thickening and surrounding fat stranding, nonspecific. Mild/early cholecystitis is not excluded. Consider further evaluation with right upper quadrant ultrasound. 3. Cholelithiasis. Electronically Signed   By: Lovey Newcomer M.D.   On: 08/25/2022 15:52    Anti-infectives: Anti-infectives (From admission, onward)    Start     Dose/Rate Route Frequency Ordered Stop   08/26/22 1700  cefTRIAXone (ROCEPHIN) 2 g in sodium chloride 0.9 % 100 mL IVPB        2 g 200 mL/hr over 30 Minutes Intravenous Every 24 hours 08/25/22 1911     08/25/22 1630  cefTRIAXone (ROCEPHIN) 2 g in sodium chloride 0.9 % 100 mL IVPB        2 g 200 mL/hr over 30 Minutes Intravenous  Once 08/25/22 1623 08/25/22 1738   08/25/22 1630  metroNIDAZOLE  (FLAGYL) IVPB 500 mg        500 mg 100 mL/hr over 60 Minutes Intravenous Every 12 hours 08/25/22 1623          Assessment/Plan Choledocholithiasis  Hyperbilirubinemia - Patient is scheduled for ERCP today. Pending this goes well and he has no complications we will tentatively plan for lap chole tomorrow. Repeat labs in AM. NPO after midnight. Will need to hold heparin gtt about 6 hours prior to surgery. Continue to hold xarelto.  ID - rocephin/flagyl 10/9>> FEN - IVF, NPO VTE - heparin gtt Foley - none  A fib on xarelto (holding) CAD HLD HTN Obesity BMI 37.38 OSA Thrombocytopenia  I reviewed Consultant gastroenterology notes, hospitalist notes, last 24 h vitals and pain scores, last 48 h intake and output, last 24 h labs and trends, and last 24 h imaging results.    LOS: 1 day    [facility] 08/26/2022, 9:05 AM Please see Amion for pager number during day hours 7:00am-4:30pm

## 2022-08-26 NOTE — Progress Notes (Signed)
[place] for heparin Indication: atrial fibrillation  No Known Allergies  Patient Measurements: Height: '6\' 1"'$  (185.4 cm) Weight: 128.5 kg (283 lb 4.7 oz) IBW/kg (Calculated) : 79.9 Heparin Dosing Weight: 108.3 kg   Vital Signs: Temp: 99.3 F (37.4 C) (10/10 0636) Temp Source: Oral (10/10 0636) BP: 137/78 (10/10 0636) Pulse Rate: 88 (10/10 0636)  Labs: Recent Labs    08/25/22 1409 08/25/22 1949 08/26/22 0544  HGB 13.6  --  12.6*  HCT 40.9  --  37.4*  PLT 125*  --  101*  APTT  --  33 56*  LABPROT 15.7*  --   --   INR 1.3*  --   --   HEPARINUNFRC  --  0.29* 0.32  CREATININE 0.64  --  0.56*     Estimated Creatinine Clearance: 113.8 mL/min (A) (by C-G formula based on SCr of 0.56 mg/dL (L)).   Medical History: Past Medical History:  Diagnosis Date   Atrial fibrillation ([place])    Coronary artery calcification    Dyslipidemia    Erectile dysfunction    Hyperlipidemia    Hypertension    Obesity    OSA (obstructive sleep apnea)    Permanent atrial fibrillation ([place]) 04/25/2015   Cardioversion successful after 3 attempts on 03/20/2015; pt back in A. Fib on 03/28/2015. Anticoagulation long-term.   Sleep apnea    Phreesia 09/25/2020   Tobacco user 06/22/2013   Pt quit smoking October 2014.     Medications:  Medications Prior to Admission  Medication Sig Dispense Refill Last Dose   ALEVE 220 MG tablet Take 220 mg by mouth 2 (two) times daily as needed (for pain).   unk   atorvastatin (LIPITOR) 40 MG tablet Take 1 tablet (40 mg total) by mouth daily. (Patient taking differently: Take 40 mg by mouth at bedtime.) 90 tablet 2 08/24/2022 at pm   Multiple Vitamins-Minerals (CENTRUM SILVER 50+MEN) TABS Take 1 tablet by mouth daily with breakfast.   08/24/2022 at am   niacin (NIASPAN) 1000 MG CR tablet Take 1 tablet by mouth once daily as directed with a low fat snack and aspirin 30 minutes prior to taking medication (Patient taking  differently: Take 1,000 mg by mouth See admin instructions. Take 1,000 mg by mouth once daily as directed with a low fat snack) 90 tablet 1 08/24/2022   verapamil (VERELAN PM) 240 MG 24 hr capsule Take 1 capsule (240 mg total) by mouth at bedtime. 90 capsule 2 08/24/2022   vitamin C (ASCORBIC ACID) 500 MG tablet Take 500 mg by mouth daily.   08/24/2022   XARELTO 20 MG TABS tablet Take 1 tablet (20 mg total) by mouth daily. 90 tablet 3 08/24/2022 at 1330    Assessment: Pharmacy is consulted to dose heparin drip in 76 yo male with PMH of Atrial fibrillation. Pt is on Xarelto PTA. Xarelto currently on hold due to likely ERCP on Wednesday. Last dose of Xarelto on 10/8 at 1330 per med rec.   Today, 08/26/22 HL = 0.32 and aPTT = 56 sec (subtherapeutic) with heparin gtt @ 1650 units/hr Continue to use aPTT for monitoring heparin as effects of Xarelto likely still present with last dose being taken on 10/8 RN confirmed no interruption in therapy and no line issues; no bleeding complications reported CBC IP  Goal of Therapy:  Heparin level 0.3-0.7 units/ml Monitor platelets by anticoagulation protocol: Yes   Plan:  Increase heparin drip to 1800 units/hr Obtain aPTT 8  hours after increasing rate of heparin gtt Daily CBC, aPTT and HL  Monitor for signs and symptoms of bleeding   Leone Haven, PharmD 08/26/2022 6:36 AM

## 2022-08-26 NOTE — Anesthesia Procedure Notes (Signed)
Procedure Name: Intubation Date/Time: 08/26/2022 1:14 PM  Performed by: Aline Brochure, CRNAPre-anesthesia Checklist: Patient identified, Patient being monitored, Timeout performed, Emergency Drugs available and Suction available Patient Re-evaluated:Patient Re-evaluated prior to induction Oxygen Delivery Method: Circle system utilized Preoxygenation: Pre-oxygenation with 100% oxygen Induction Type: IV induction and Rapid sequence Laryngoscope Size: Mac and 4 Grade View: Grade I Tube type: Oral Tube size: 7.5 mm Number of attempts: 1 Airway Equipment and Method: Stylet Placement Confirmation: ETT inserted through vocal cords under direct vision, positive ETCO2 and breath sounds checked- equal and bilateral Secured at: 23 cm Tube secured with: Tape Dental Injury: Teeth and Oropharynx as per pre-operative assessment

## 2022-08-26 NOTE — Anesthesia Preprocedure Evaluation (Addendum)
Anesthesia Evaluation  Patient identified by MRN, date of birth, ID band Patient awake    Reviewed: Allergy & Precautions, NPO status , Patient's Chart, lab work & pertinent test results  History of Anesthesia Complications (+) history of anesthetic complications  Airway Mallampati: II  TM Distance: >3 FB Neck ROM: Full    Dental no notable dental hx.    Pulmonary sleep apnea and Continuous Positive Airway Pressure Ventilation , former smoker,    Pulmonary exam normal        Cardiovascular hypertension, Normal cardiovascular exam+ dysrhythmias Atrial Fibrillation + Valvular Problems/Murmurs AI   Echo 8/21 1. Normal LV systolic function with visual EF 55-60%. Left ventricle cavity is normal in size. Moderate left ventricular hypertrophy. Normal global wall motion. Unable to evaluate diastolic function due to atrial fibrillation. Elevated LAP. Calculated EF 59%. 2. Left atrial cavity is severely dilated. 3. Grossly right atrial size is moderately dilated. 4. Mild (Grade I) aortic regurgitation. Aortic sclerosis without stenosis. 5. Mild tricuspid regurgitation. Mild pulmonary hypertension. RVSP measures 38 mmHg. 6. Insignificant pericardial effusion. There is no hemodynamic significance. 7. The aortic root is dilated, sinus tubular junction 3.8cm. 8. IVC is dilated with a respiratory response of <50%. 9. Compared to prior study dated 11/21/2014: Mild AR, Mild TR, mild PHTN, and aortic dilation are new findings.   Neuro/Psych negative neurological ROS     GI/Hepatic negative GI ROS, Neg liver ROS,   Endo/Other  negative endocrine ROS  Renal/GU negative Renal ROS     Musculoskeletal negative musculoskeletal ROS (+)   Abdominal   Peds  Hematology  (+) Blood dyscrasia, anemia ,   Anesthesia Other Findings   Reproductive/Obstetrics                            Anesthesia Physical Anesthesia  Plan  ASA: 3  Anesthesia Plan: General   Post-op Pain Management: Minimal or no pain anticipated   Induction: Intravenous  PONV Risk Score and Plan: Ondansetron and Propofol infusion  Airway Management Planned: Oral ETT  Additional Equipment:   Intra-op Plan:   Post-operative Plan: Extubation in OR  Informed Consent: I have reviewed the patients History and Physical, chart, labs and discussed the procedure including the risks, benefits and alternatives for the proposed anesthesia with the patient or authorized representative who has indicated his/her understanding and acceptance.     Dental advisory given  Plan Discussed with: Anesthesiologist and CRNA  Anesthesia Plan Comments:       Anesthesia Quick Evaluation

## 2022-08-26 NOTE — Progress Notes (Signed)
For ERCP today at 1pm. Heparin stopped at 9am. Labs reviewed: Hgb 13.6 >> 12.6. WBC normal. Afebrile, VSS.

## 2022-08-26 NOTE — Interval H&P Note (Signed)
History and Physical Interval Note:  08/26/2022 1:00 PM  Edward Watson  has presented today for surgery, with the diagnosis of choledocholithiasis.  The various methods of treatment have been discussed with the patient and family. After consideration of risks, benefits and other options for treatment, the patient has consented to  Procedure(s): ENDOSCOPIC RETROGRADE CHOLANGIOPANCREATOGRAPHY (ERCP) (N/A) as a surgical intervention.  The patient's history has been reviewed, patient examined, no change in status, stable for surgery.  I have reviewed the patient's chart and labs.  Questions were answered to the patient's satisfaction.     Silvano Rusk

## 2022-08-26 NOTE — Progress Notes (Signed)
[place] for heparin Indication: atrial fibrillation  No Known Allergies  Patient Measurements: Height: '6\' 1"'$  (185.4 cm) Weight: 128.5 kg (283 lb 4.7 oz) IBW/kg (Calculated) : 79.9 Heparin Dosing Weight: 108.3 kg   Vital Signs: Temp: 98.8 F (37.1 C) (10/10 1216) Temp Source: Oral (10/10 1216) BP: 162/75 (10/10 1227) Pulse Rate: 59 (10/10 1227)  Labs: Recent Labs    08/25/22 1409 08/25/22 1949 08/26/22 0544  HGB 13.6  --  12.6*  HCT 40.9  --  37.4*  PLT 125*  --  101*  APTT  --  33 56*  LABPROT 15.7*  --   --   INR 1.3*  --   --   HEPARINUNFRC  --  0.29* 0.32  CREATININE 0.64  --  0.56*     Estimated Creatinine Clearance: 113.8 mL/min (A) (by C-G formula based on SCr of 0.56 mg/dL (L)).   Medical History: Past Medical History:  Diagnosis Date   Atrial fibrillation ([place])    Coronary artery calcification    Dyslipidemia    Erectile dysfunction    Hyperlipidemia    Hypertension    Obesity    OSA (obstructive sleep apnea)    Permanent atrial fibrillation ([place]) 04/25/2015   Cardioversion successful after 3 attempts on 03/20/2015; pt back in A. Fib on 03/28/2015. Anticoagulation long-term.   Sleep apnea    Phreesia 09/25/2020   Tobacco user 06/22/2013   Pt quit smoking October 2014.     Medications:  Medications Prior to Admission  Medication Sig Dispense Refill Last Dose   ALEVE 220 MG tablet Take 220 mg by mouth 2 (two) times daily as needed (for pain).   unk   atorvastatin (LIPITOR) 40 MG tablet Take 1 tablet (40 mg total) by mouth daily. (Patient taking differently: Take 40 mg by mouth at bedtime.) 90 tablet 2 08/24/2022 at pm   Multiple Vitamins-Minerals (CENTRUM SILVER 50+MEN) TABS Take 1 tablet by mouth daily with breakfast.   08/24/2022 at am   niacin (NIASPAN) 1000 MG CR tablet Take 1 tablet by mouth once daily as directed with a low fat snack and aspirin 30 minutes prior to taking medication (Patient taking  differently: Take 1,000 mg by mouth See admin instructions. Take 1,000 mg by mouth once daily as directed with a low fat snack) 90 tablet 1 08/24/2022   verapamil (VERELAN PM) 240 MG 24 hr capsule Take 1 capsule (240 mg total) by mouth at bedtime. 90 capsule 2 08/24/2022   vitamin C (ASCORBIC ACID) 500 MG tablet Take 500 mg by mouth daily.   08/24/2022   XARELTO 20 MG TABS tablet Take 1 tablet (20 mg total) by mouth daily. 90 tablet 3 08/24/2022 at 1330    Assessment: Pharmacy is consulted to dose heparin drip in 76 yo male with PMH of Atrial fibrillation. Pt is on Xarelto PTA. Xarelto currently on hold due to likely ERCP on Wednesday. Last dose of Xarelto on 10/8 at 1330 per med rec.  Patient with atrial fibrillation, CHADSVASC ~ 3.  Goal of Therapy:  Heparin level 0.3-0.7 units/ml Monitor platelets by anticoagulation protocol: Yes   Plan:  -Heparin off ~ 0900 this morning for ERCP today at 1300 -Per surgery note, plan for lap chole 10/11 - on OR schedule 1115 - need to hold heparin ~ 6 hrs prior to procedure -Per discussion with Dr. Alfredia Ferguson, will hold heparin overnight and resume post op tomorrow as appropriate per surgery  [place],  PharmD, BCPS Clinical Pharmacist 08/26/2022 12:59 PM

## 2022-08-26 NOTE — Transfer of Care (Signed)
Immediate Anesthesia Transfer of Care Note  Patient: Edward Watson  Procedure(s) Performed: ENDOSCOPIC RETROGRADE CHOLANGIOPANCREATOGRAPHY (ERCP) BIOPSY SPHINCTEROTOMY REMOVAL OF STONES  Patient Location: Endoscopy Unit  Anesthesia Type:General  Level of Consciousness: awake, alert  and oriented  Airway & Oxygen Therapy: Patient Spontanous Breathing and Patient connected to face mask oxygen  Post-op Assessment: Report given to RN and Post -op Vital signs reviewed and stable  Post vital signs: Reviewed and stable  Last Vitals:  Vitals Value Taken Time  BP 122/72 08/26/22 1420  Temp 37.2 C 08/26/22 1418  Pulse 114 08/26/22 1424  Resp 24 08/26/22 1424  SpO2 97 % 08/26/22 1424  Vitals shown include unvalidated device data.  Last Pain:  Vitals:   08/26/22 1418  TempSrc: Temporal  PainSc:          Complications: No notable events documented.

## 2022-08-27 ENCOUNTER — Other Ambulatory Visit: Payer: Self-pay

## 2022-08-27 ENCOUNTER — Inpatient Hospital Stay (HOSPITAL_COMMUNITY): Payer: PPO | Admitting: Anesthesiology

## 2022-08-27 ENCOUNTER — Encounter (HOSPITAL_COMMUNITY)
Admission: EM | Disposition: A | Discharge status: Home / Self Care | Payer: Self-pay | Source: Ambulatory Visit | Attending: Internal Medicine

## 2022-08-27 DIAGNOSIS — K8020 Calculus of gallbladder without cholecystitis without obstruction: Secondary | ICD-10-CM | POA: Diagnosis not present

## 2022-08-27 DIAGNOSIS — I10 Essential (primary) hypertension: Secondary | ICD-10-CM | POA: Diagnosis not present

## 2022-08-27 DIAGNOSIS — K8050 Calculus of bile duct without cholangitis or cholecystitis without obstruction: Secondary | ICD-10-CM | POA: Diagnosis not present

## 2022-08-27 DIAGNOSIS — D649 Anemia, unspecified: Secondary | ICD-10-CM

## 2022-08-27 DIAGNOSIS — Z87891 Personal history of nicotine dependence: Secondary | ICD-10-CM

## 2022-08-27 DIAGNOSIS — I2510 Atherosclerotic heart disease of native coronary artery without angina pectoris: Secondary | ICD-10-CM

## 2022-08-27 HISTORY — PX: CHOLECYSTECTOMY: SHX55

## 2022-08-27 LAB — CBC WITH DIFFERENTIAL/PLATELET
Abs Immature Granulocytes: 0.03 10*3/uL (ref 0.00–0.07)
Basophils Absolute: 0 10*3/uL (ref 0.0–0.1)
Basophils Relative: 0 %
Eosinophils Absolute: 0 10*3/uL (ref 0.0–0.5)
Eosinophils Relative: 1 %
HCT: 36.1 % — ABNORMAL LOW (ref 39.0–52.0)
Hemoglobin: 11.7 g/dL — ABNORMAL LOW (ref 13.0–17.0)
Immature Granulocytes: 1 %
Lymphocytes Relative: 15 %
Lymphs Abs: 1 10*3/uL (ref 0.7–4.0)
MCH: 30.4 pg (ref 26.0–34.0)
MCHC: 32.4 g/dL (ref 30.0–36.0)
MCV: 93.8 fL (ref 80.0–100.0)
Monocytes Absolute: 0.7 10*3/uL (ref 0.1–1.0)
Monocytes Relative: 11 %
Neutro Abs: 4.7 10*3/uL (ref 1.7–7.7)
Neutrophils Relative %: 72 %
Platelets: 101 10*3/uL — ABNORMAL LOW (ref 150–400)
RBC: 3.85 MIL/uL — ABNORMAL LOW (ref 4.22–5.81)
RDW: 14.4 % (ref 11.5–15.5)
WBC: 6.4 10*3/uL (ref 4.0–10.5)
nRBC: 0 % (ref 0.0–0.2)

## 2022-08-27 LAB — COMPREHENSIVE METABOLIC PANEL
ALT: 229 U/L — ABNORMAL HIGH (ref 0–44)
AST: 123 U/L — ABNORMAL HIGH (ref 15–41)
Albumin: 3.2 g/dL — ABNORMAL LOW (ref 3.5–5.0)
Alkaline Phosphatase: 111 U/L (ref 38–126)
Anion gap: 7 (ref 5–15)
BUN: 19 mg/dL (ref 8–23)
CO2: 22 mmol/L (ref 22–32)
Calcium: 8.3 mg/dL — ABNORMAL LOW (ref 8.9–10.3)
Chloride: 105 mmol/L (ref 98–111)
Creatinine, Ser: 0.76 mg/dL (ref 0.61–1.24)
GFR, Estimated: >60 mL/min (ref >60)
Glucose, Bld: 118 mg/dL — ABNORMAL HIGH (ref 70–99)
Potassium: 4.2 mmol/L (ref 3.5–5.1)
Sodium: 134 mmol/L — ABNORMAL LOW (ref 135–145)
Total Bilirubin: 4.1 mg/dL — ABNORMAL HIGH (ref 0.3–1.2)
Total Protein: 5.8 g/dL — ABNORMAL LOW (ref 6.5–8.1)

## 2022-08-27 LAB — SURGICAL PATHOLOGY

## 2022-08-27 LAB — HEMOGLOBIN AND HEMATOCRIT, BLOOD
HCT: 40 % (ref 39.0–52.0)
Hemoglobin: 13.1 g/dL (ref 13.0–17.0)

## 2022-08-27 LAB — GLUCOSE, CAPILLARY: Glucose-Capillary: 105 mg/dL — ABNORMAL HIGH (ref 70–99)

## 2022-08-27 LAB — MAGNESIUM: Magnesium: 2.1 mg/dL (ref 1.7–2.4)

## 2022-08-27 LAB — PHOSPHORUS: Phosphorus: 2.8 mg/dL (ref 2.5–4.6)

## 2022-08-27 SURGERY — LAPAROSCOPIC CHOLECYSTECTOMY
Anesthesia: General

## 2022-08-27 MED ORDER — LACTATED RINGERS IV SOLN: INTRAVENOUS | Status: DC

## 2022-08-27 MED ORDER — DEXAMETHASONE SODIUM PHOSPHATE 10 MG/ML IJ SOLN
INTRAMUSCULAR | Status: AC
  Filled 2022-08-27: qty 1

## 2022-08-27 MED ORDER — SUGAMMADEX SODIUM 500 MG/5ML IV SOLN
INTRAVENOUS | Status: AC
  Filled 2022-08-27: qty 5

## 2022-08-27 MED ORDER — OXYCODONE HCL 5 MG PO TABS
ORAL_TABLET | ORAL | Status: AC
  Administered 2022-08-27: 5 mg via ORAL
  Filled 2022-08-27: qty 1

## 2022-08-27 MED ORDER — BUPIVACAINE-EPINEPHRINE 0.25% -1:200000 IJ SOLN
INTRAMUSCULAR | Status: DC | PRN
  Administered 2022-08-27: 24 mL

## 2022-08-27 MED ORDER — OXYCODONE HCL 5 MG/5ML PO SOLN: 5.0000 mg | Freq: Once | ORAL | Status: AC | PRN

## 2022-08-27 MED ORDER — ROCURONIUM BROMIDE 10 MG/ML (PF) SYRINGE
PREFILLED_SYRINGE | INTRAVENOUS | Status: DC | PRN
  Administered 2022-08-27: 100 mg via INTRAVENOUS

## 2022-08-27 MED ORDER — METHOCARBAMOL 1000 MG/10ML IJ SOLN: 500.0000 mg | Freq: Four times a day (QID) | INTRAVENOUS | Status: DC | PRN

## 2022-08-27 MED ORDER — OXYCODONE HCL 5 MG PO TABS
5.0000 mg | ORAL_TABLET | Freq: Once | ORAL | Status: AC | PRN
  Administered 2022-08-27: 5 mg via ORAL

## 2022-08-27 MED ORDER — FENTANYL CITRATE (PF) 100 MCG/2ML IJ SOLN
INTRAMUSCULAR | Status: AC
  Filled 2022-08-27: qty 2

## 2022-08-27 MED ORDER — SUGAMMADEX SODIUM 500 MG/5ML IV SOLN
INTRAVENOUS | Status: DC | PRN
  Administered 2022-08-27: 300 mg via INTRAVENOUS

## 2022-08-27 MED ORDER — LIDOCAINE 2% (20 MG/ML) 5 ML SYRINGE
INTRAMUSCULAR | Status: DC | PRN
  Administered 2022-08-27: 100 mg via INTRAVENOUS

## 2022-08-27 MED ORDER — HYDROMORPHONE HCL 1 MG/ML IJ SOLN: 0.2500 mg | INTRAMUSCULAR | Status: DC | PRN

## 2022-08-27 MED ORDER — ONDANSETRON HCL 4 MG/2ML IJ SOLN
INTRAMUSCULAR | Status: DC | PRN
  Administered 2022-08-27: 4 mg via INTRAVENOUS

## 2022-08-27 MED ORDER — DEXAMETHASONE SODIUM PHOSPHATE 10 MG/ML IJ SOLN
INTRAMUSCULAR | Status: DC | PRN
  Administered 2022-08-27: 10 mg via INTRAVENOUS

## 2022-08-27 MED ORDER — MORPHINE SULFATE (PF) 2 MG/ML IV SOLN
2.0000 mg | INTRAVENOUS | Status: DC | PRN
  Administered 2022-08-27: 4 mg via INTRAVENOUS
  Filled 2022-08-27: qty 2

## 2022-08-27 MED ORDER — LACTATED RINGERS IR SOLN
Status: DC | PRN
  Administered 2022-08-27: 1000 mL

## 2022-08-27 MED ORDER — 0.9 % SODIUM CHLORIDE (POUR BTL) OPTIME
TOPICAL | Status: DC | PRN
  Administered 2022-08-27: 1000 mL

## 2022-08-27 MED ORDER — PROPOFOL 10 MG/ML IV BOLUS
INTRAVENOUS | Status: DC | PRN
  Administered 2022-08-27: 200 mg via INTRAVENOUS

## 2022-08-27 MED ORDER — FENTANYL CITRATE (PF) 100 MCG/2ML IJ SOLN
INTRAMUSCULAR | Status: DC | PRN
  Administered 2022-08-27 (×3): 100 ug via INTRAVENOUS

## 2022-08-27 MED ORDER — ONDANSETRON HCL 4 MG/2ML IJ SOLN
INTRAMUSCULAR | Status: AC
  Filled 2022-08-27: qty 2

## 2022-08-27 MED ORDER — PROPOFOL 10 MG/ML IV BOLUS
INTRAVENOUS | Status: AC
  Filled 2022-08-27: qty 20

## 2022-08-27 MED ORDER — AMISULPRIDE (ANTIEMETIC) 5 MG/2ML IV SOLN: 10.0000 mg | Freq: Once | INTRAVENOUS | Status: DC | PRN

## 2022-08-27 MED ORDER — PROMETHAZINE HCL 25 MG/ML IJ SOLN: 6.2500 mg | INTRAMUSCULAR | Status: DC | PRN

## 2022-08-27 MED ORDER — BUPIVACAINE-EPINEPHRINE (PF) 0.25% -1:200000 IJ SOLN
INTRAMUSCULAR | Status: AC
  Filled 2022-08-27: qty 30

## 2022-08-27 MED ORDER — LIDOCAINE HCL (PF) 2 % IJ SOLN
INTRAMUSCULAR | Status: AC
  Filled 2022-08-27: qty 5

## 2022-08-27 MED ORDER — CHLORHEXIDINE GLUCONATE 0.12 % MT SOLN
15.0000 mL | Freq: Once | OROMUCOSAL | Status: AC
  Administered 2022-08-27: 15 mL via OROMUCOSAL

## 2022-08-27 MED ORDER — HEMOSTATIC AGENTS (NO CHARGE) OPTIME
TOPICAL | Status: DC | PRN
  Administered 2022-08-27: 1 via TOPICAL

## 2022-08-27 SURGICAL SUPPLY — 37 items
ADH SKN CLS APL DERMABOND .7 (GAUZE/BANDAGES/DRESSINGS) ×1
APL PRP STRL LF DISP 70% ISPRP (MISCELLANEOUS) ×1
APPLIER CLIP 5 13 M/L LIGAMAX5 (MISCELLANEOUS) ×1
APR CLP MED LRG 5 ANG JAW (MISCELLANEOUS) ×1
BAG COUNTER SPONGE SURGICOUNT (BAG) IMPLANT
BAG SPNG CNTER NS LX DISP (BAG)
CABLE HIGH FREQUENCY MONO STRZ (ELECTRODE) ×1 IMPLANT
CATH REDDICK CHOLANGI 4FR 50CM (CATHETERS) ×1 IMPLANT
CHLORAPREP W/TINT 26 (MISCELLANEOUS) ×1 IMPLANT
CLIP APPLIE 5 13 M/L LIGAMAX5 (MISCELLANEOUS) ×1 IMPLANT
COVER MAYO STAND XLG (MISCELLANEOUS) ×1 IMPLANT
DERMABOND ADVANCED .7 DNX12 (GAUZE/BANDAGES/DRESSINGS) ×1 IMPLANT
DRAPE C-ARM 42X120 X-RAY (DRAPES) ×1 IMPLANT
ELECT REM PT RETURN 15FT ADLT (MISCELLANEOUS) ×1 IMPLANT
GLOVE BIO SURGEON STRL SZ7.5 (GLOVE) ×1 IMPLANT
GOWN STRL REUS W/ TWL LRG LVL3 (GOWN DISPOSABLE) IMPLANT
GOWN STRL REUS W/TWL LRG LVL3 (GOWN DISPOSABLE)
HEMOSTAT SNOW SURGICEL 2X4 (HEMOSTASIS) IMPLANT
HEMOSTAT SURGICEL 4X8 (HEMOSTASIS) IMPLANT
IRRIG SUCT STRYKERFLOW 2 WTIP (MISCELLANEOUS) ×1
IRRIGATION SUCT STRKRFLW 2 WTP (MISCELLANEOUS) ×1 IMPLANT
IV CATH 14GX2 1/4 (CATHETERS) ×1 IMPLANT
KIT BASIN OR (CUSTOM PROCEDURE TRAY) ×1 IMPLANT
KIT TURNOVER KIT A (KITS) IMPLANT
PENCIL SMOKE EVACUATOR (MISCELLANEOUS) IMPLANT
SCISSORS LAP 5X35 DISP (ENDOMECHANICALS) ×1 IMPLANT
SET TUBE SMOKE EVAC HIGH FLOW (TUBING) ×1 IMPLANT
SLEEVE Z-THREAD 5X100MM (TROCAR) ×2 IMPLANT
SPIKE FLUID TRANSFER (MISCELLANEOUS) ×1 IMPLANT
SUT MNCRL AB 4-0 PS2 18 (SUTURE) ×1 IMPLANT
SYS BAG RETRIEVAL 10MM (BASKET) ×1
SYSTEM BAG RETRIEVAL 10MM (BASKET) ×1 IMPLANT
TOWEL OR 17X26 10 PK STRL BLUE (TOWEL DISPOSABLE) ×1 IMPLANT
TOWEL OR NON WOVEN STRL DISP B (DISPOSABLE) ×1 IMPLANT
TRAY LAPAROSCOPIC (CUSTOM PROCEDURE TRAY) ×1 IMPLANT
TROCAR BALLN 12MMX100 BLUNT (TROCAR) ×1 IMPLANT
TROCAR Z-THREAD OPTICAL 5X100M (TROCAR) ×1 IMPLANT

## 2022-08-27 NOTE — Progress Notes (Signed)
Daily Progress Note  Hospital Day: 3  Chief Complaint: abdominal pain, bile duct stone  Brief History 76 yo male with pmh of CAD, Afib (on Xarelto), HTN, HLD, OSA, Obesity, colon polyps, diverticulosis.  Admitted with cholelithiasis / choledocholithiasis, ? Early cholecystitis. Seen in consultation by [place] on 10/9  Assessment / Plan   # 76 yo male with cholelithiasis /possible choledocholithiasis and ? early cholecystitis on imaging. Had ERCP with sphincterotomy and balloon sweep of bile duct. No stone found but there was evidence of mucosal trauma indicating that a stone had passed Labs reviewed, liver chemistries improving. Bili 6.5 >> 4.1.  WBC normal. Hgb down from 13.6 on admit to 11.7 ( after IVF) ? Need to continue Rocephin / Flagyl since bile draining at this point and no strong evidence for cholecystitis. Will leave it to General Surgery, h\aving cholecystectomy today  # Abnormal papilla on ERCP ( multi-lobulated, enlarge) but appeared benign. Biopsies pending    # Afib with RVR. On Xarelto at home. IV heparin on hold for cholecystectomy today     Subjective  Feels fine, no abdominal pain.  Awaiting surgery   Objective   Endoscopic studies:      Lab Results: Recent Labs    08/25/22 1409 08/26/22 0544 08/27/22 0512  WBC 9.3 7.2 6.4  HGB 13.6 12.6* 11.7*  HCT 40.9 37.4* 36.1*  PLT 125* 101* 101*   BMET Recent Labs    08/25/22 1409 08/26/22 0544 08/27/22 0512  NA 134* 132* 134*  K 3.4* 4.0 4.2  CL 100 102 105  CO2 24 21* 22  GLUCOSE 117* 122* 118*  BUN '12 14 19  '$ CREATININE 0.64 0.56* 0.76  CALCIUM 9.0 8.4* 8.3*   LFT Recent Labs    08/27/22 0512  PROT 5.8*  ALBUMIN 3.2*  AST 123*  ALT 229*  ALKPHOS 111  BILITOT 4.1*   PT/INR Recent Labs    08/25/22 1409  LABPROT 15.7*  INR 1.3*     Scheduled inpatient medications:   ascorbic acid  500 mg Oral Daily   multivitamin with minerals  1 tablet Oral Q breakfast   potassium chloride   40 mEq Oral BID   verapamil  240 mg Oral QHS   Continuous inpatient infusions:   sodium chloride 75 mL/hr at 08/26/22 1308   cefTRIAXone (ROCEPHIN)  IV Stopped (08/26/22 1900)   lactated ringers Stopped (08/26/22 1600)   metronidazole 500 mg (08/27/22 0436)   PRN inpatient medications: acetaminophen **OR** acetaminophen, levalbuterol, morphine injection, ondansetron **OR** ondansetron (ZOFRAN) IV, oxyCODONE  Vital signs in last 24 hours: Temp:  [97.5 F (36.4 C)-98.9 F (37.2 C)] 97.5 F (36.4 C) (10/11 0442) Pulse Rate:  [59-117] 69 (10/11 0442) Resp:  [16-26] 16 (10/11 0442) BP: (121-162)/(64-91) 124/79 (10/11 0442) SpO2:  [94 %-97 %] 97 % (10/11 0442) Weight:  [128.4 kg] 128.4 kg (10/11 0500)    Intake/Output Summary (Last 24 hours) at 08/27/2022 0851 Last data filed at 08/27/2022 0400 Gross per 24 hour  Intake 573.91 ml  Output --  Net 573.91 ml    Intake/Output from previous day: 10/10 0701 - 10/11 0700 In: 573.9 [I.V.:473.9; IV Piggyback:100] Out: -  Intake/Output this shift: No intake/output data recorded.   Physical Exam:  General: Alert male in NAD Heart:  Regular rate and rhythm. No lower extremity edema Pulmonary: Normal respiratory effort Abdomen: Soft, nondistended, nontender. Normal bowel sounds.  Neurologic: Alert and oriented Psych: Pleasant. Cooperative.    Principal Problem:  Choledocholithiasis Active Problems:   HTN (hypertension)   Dyslipidemia   Obesity (BMI 35.0-39.9 without comorbidity)   Permanent atrial fibrillation (HCC)   OSA (obstructive sleep apnea)   Hypercoagulable state due to permanent atrial fibrillation (HCC)   OSA on CPAP   Hyponatremia   Hypokalemia     LOS: 2 days   Tye Savoy ,NP 08/27/2022, 8:51 AM

## 2022-08-27 NOTE — Op Note (Signed)
08/27/2022  4:02 PM  PATIENT:  Edward Watson  76 y.o. male  PRE-OPERATIVE DIAGNOSIS:  CHOLEDOCHOLITHIASIS WITH CHOLELITHIASIS  POST-OPERATIVE DIAGNOSIS:  CHOLEDOCHOLITHIASIS WITH CHOLELITHIASIS  PROCEDURE:  Procedure(s): LAPAROSCOPIC CHOLECYSTECTOMY (N/A)  SURGEON:  Surgeon(s) and Role:    * Jovita Kussmaul, MD - Primary  PHYSICIAN ASSISTANT:   ASSISTANTS: none   ANESTHESIA:   local and general  EBL:  100 mL   BLOOD ADMINISTERED:none  DRAINS: none   LOCAL MEDICATIONS USED:  MARCAINE     SPECIMEN:  Source of Specimen:  gallbladder  DISPOSITION OF SPECIMEN:  PATHOLOGY  COUNTS:  YES  TOURNIQUET:  * No tourniquets in log *  DICTATION: .Dragon Dictation    Procedure: After informed consent was obtained the patient was brought to the operating room and placed in the supine position on the operating room table. After adequate induction of general anesthesia the patient's abdomen was prepped with ChloraPrep allowed to dry and draped in usual sterile manner. An appropriate timeout was performed. The area below the umbilicus was infiltrated with quarter percent  Marcaine. A small incision was made with a 15 blade knife. The incision was carried down through the subcutaneous tissue bluntly with a hemostat and [employer] retractors. The linea alba was identified. The linea alba was incised with a 15 blade knife and each side was grasped with Coker clamps. The preperitoneal space was then probed with a hemostat until the peritoneum was opened and access was gained to the abdominal cavity. A 0 Vicryl pursestring stitch was placed in the fascia surrounding the opening. A Hassan cannula was then placed through the opening and anchored in place with the previously placed Vicryl purse string stitch. The abdomen was insufflated with carbon dioxide without difficulty. A laparoscope was inserted through the [facility] cannula in the right upper quadrant was inspected. Next the epigastric region  was infiltrated with % Marcaine. A small incision was made with a 15 blade knife. A 5 mm port was placed bluntly through this incision into the abdominal cavity under direct vision. Next 2 sites were chosen laterally on the right side of the abdomen for placement of 5 mm ports. Each of these areas was infiltrated with quarter percent Marcaine. Small stab incisions were made with a 15 blade knife. 5 mm ports were then placed bluntly through these incisions into the abdominal cavity under direct vision without difficulty. A blunt grasper was placed through the lateralmost 5 mm port and used to grasp the dome of the gallbladder and elevate it anteriorly and superiorly. Another blunt grasper was placed through the other 5 mm port and used to retract the body and neck of the gallbladder. A dissector was placed through the epigastric port and there were omental adhesions to the body of the gallbladder. They were taken down with  blunt dissection until the gallbladder neck-cystic duct junction was readily identified and a good window was created. 2 clips were placed proximally on the cystic duct and one distally and the duct was divided between the 2 sets of clips. Posterior to this the cystic artery was identified and again dissected bluntly in a circumferential manner until a good window  was created. 2 clips were placed proximally and one distally on the artery and the artery was divided between the 2 sets of clips. Next a laparoscopic hook cautery device was used to separate the gallbladder from the liver bed. Prior to completely detaching the gallbladder from the liver bed the liver bed was inspected and  several small bleeding points were coagulated with the electrocautery until the area was completely hemostatic. The top portion of the gallbladder was somewhat intrahepatic. The gallbladder was then detached the rest of it from the liver bed without difficulty. I also placed a piece of surgicel snow in the  gallbladder bed of the liver. A laparoscopic bag was inserted through the hassan port. The laparoscope was moved to the epigastric port. The gallbladder was placed within the bag and the bag was sealed.  The bag with the gallbladder was then removed with the [facility] cannula through the infraumbilical port without difficulty. The fascial defect was then closed with the previously placed Vicryl pursestring stitch as well as with another figure-of-eight 0 Vicryl stitch. The liver bed was inspected again and found to be hemostatic. The abdomen was irrigated with copious amounts of saline until the effluent was clear. The ports were then removed under direct vision without difficulty and were found to be hemostatic. The gas was allowed to escape. No other abnormalities were noted on general inspection of the abdomen. The skin incisions were all closed with interrupted 4-0 Monocryl subcuticular stitches. Dermabond dressings were applied. The patient tolerated the procedure well. At the end of the case all needle sponge and instrument counts were correct. The patient was then awakened and taken to recovery in stable condition   PLAN OF CARE: Admit to inpatient   PATIENT DISPOSITION:  PACU - hemodynamically stable.   Delay start of Pharmacological VTE agent (>24hrs) due to surgical blood loss or risk of bleeding: no

## 2022-08-27 NOTE — Interval H&P Note (Signed)
History and Physical Interval Note:  08/27/2022 1:55 PM  Edward Watson  has presented today for surgery, with the diagnosis of CHOLEDOCHOLITHIASIS.  The various methods of treatment have been discussed with the patient and family. After consideration of risks, benefits and other options for treatment, the patient has consented to  Procedure(s): LAPAROSCOPIC CHOLECYSTECTOMY WITH INTRAOPERATIVE CHOLANGIOGRAM (N/A) as a surgical intervention.  The patient's history has been reviewed, patient examined, no change in status, stable for surgery.  I have reviewed the patient's chart and labs.  Questions were answered to the patient's satisfaction.     [name]

## 2022-08-27 NOTE — Progress Notes (Signed)
PROGRESS NOTE    Edward Watson  ZDG:[id number] DOB: 02/09/1946 DOA: 08/25/2022 PCP: [name], MD    Brief Narrative:   Edward Watson is a 75 y.o. male with past medical history significant for permanent atrial fibrillation on Xarelto, CAD, dyslipidemia, essential hypertension, obesity, OSA, ED, history of tobacco use disorder who presented to [facility] ED on 10/9 by direction of PCP for further evaluation of jaundice, dark urine, and abdominal pain.  Patient reports onset few days prior to admission.  Associated with chills.  Abdominal pain started on the left with radiation towards the right in which he felt that his abdominal muscles were "sore".  In the ED, temperature 97.8 F, HR 109, RR 18, BP 155/107, SPO2 96% on room air.  WBC 9.3, hemoglobin 13.6, platelets 125.  Sodium 134, potassium 3.4, chloride 100, CO2 24, glucose 117, BUN 12, creatinine 0.64.  Lipase 40.  AST 162, ALT 340, total bilirubin 7.5.  INR 1.3.  COVID-19 PCR negative.  Urinalysis with moderate ketones, large bilirubin.  CT abdomen/pelvis with mild central intrahepatic biliary ductal dilation and dilation of the common bile duct, internal calcific material within the distal common bile duct at the level of the pancreatic head most compatible with choledocholithiasis, gallbladder mildly distended with suggestion of mild wall thickening and surrounding fat stranding consistent with possible early/mild cholecystitis.  Right upper quadrant ultrasound with cholelithiasis without associated sonographic findings to suggest acute cholecystitis.  GI was consulted.  Patient was started on antibiotics.  EDP consulted [facility] for admission for further evaluation management of acute choledocholithiasis.  Assessment & Plan:   Acute choledocholithiasis Patient presenting to ED with abdominal pain associated with dark urine and skin discoloration.  Patient was noted to have elevated LFTs with a total bilirubin of 7.5.  CT abdomen/pelvis  with mild central intrahepatic biliary ductal dilation and dilation of the common bile duct with calcific material within the distal common bile duct consistent with choledocholithiasis.  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  Pathology from biopsy with duodenal mucosa with architectural disarray, reactive changes and focal foveolar metaplasia and negative for dysplasia/malignancy. --GI/general surgery following, appreciate assistance --AST 167>>123 --ALT 340>>229 --Total Bili 7.5>>4.1 --Ceftriaxone 2 g IV q24h --Metronidazole '500mg'$  IV q12h --General surgery plans cholecystectomy today, n.p.o. --CMP daily  Permanent atrial fibrillation on anticoagulation Essential hypertension On verapamil 240 mg p.o. daily, Xarelto at home. -- Continue verapamil 250 mg p.o. daily -- Hold Xarelto in the setting of pending surgical intervention  CAD Dyslipidemia --Hold statin in setting of elevated LFTs  OSA: Continue nocturnal CPAP  Hyponatremia -- Continue IV fluid hydration  Hypokalemia Potassium repleted during hospitalization.  Thrombocytopenia Platelet count 101, stable.  Morbid obesity Body mass index is 37.35 kg/m.  Discussed with patient needs for aggressive lifestyle changes/weight loss as this complicates all facets of care.  Outpatient follow-up with PCP.  May benefit from bariatric evaluation outpatient.   DVT prophylaxis: SCDs Start: 08/25/22 1913    Code Status: Full Code Family Communication: Updated patient's girlfriend present at bedside this morning  Disposition Plan:  Level of care: Telemetry Status is: Inpatient Remains inpatient appropriate because: Pending surgical invention today    Consultants:  [facility] General surgery  Procedures:  ERCP 10/10, Dr. Carlean Purl Pending laparoscopic cholecystectomy 10/11  Antimicrobials:   Ceftriaxone 10/9>> Metronidazole 10/9>>   Subjective: Patient seen examined bedside, resting  comfortably.  Sitting at edge of bed.  Girlfriend present.  Awaiting surgical invention later this morning.  No other complaints or concerns at this time.  Denies headache, no fever/chills/night sweats, no nausea/vomiting/diarrhea, no chest pain, no palpitations, no shortness of breath, no abdominal pain, no cough/congestion, no focal weakness, no fatigue, no paresthesias.  No acute events overnight per nursing staff.  Objective: Vitals:   08/26/22 2029 08/27/22 0442 08/27/22 0500 08/27/22 1329  BP: (!) 146/91 124/79    Pulse: 100 69    Resp: 18 16    Temp: 98.2 F (36.8 C) (!) 97.5 F (36.4 C)    TempSrc: Oral Oral    SpO2: 96% 97%    Weight:   128.4 kg 128.4 kg  Height:    '6\' 1"'$  (1.854 m)    Intake/Output Summary (Last 24 hours) at 08/27/2022 1344 Last data filed at 08/27/2022 0900 Gross per 24 hour  Intake 573.91 ml  Output --  Net 573.91 ml   Filed Weights   08/26/22 0500 08/27/22 0500 08/27/22 1329  Weight: 128.5 kg 128.4 kg 128.4 kg    Examination:  Physical Exam: GEN: NAD, alert and oriented x 3, obese HEENT: NCAT, PERRL, EOMI, + scleral icterus, MMM PULM: CTAB w/o wheezes/crackles, normal respiratory effort, on room air CV: RRR w/o M/G/R GI: abd soft, NTND, NABS, no R/G/M MSK: no peripheral edema, muscle strength globally intact 5/5 bilateral upper/lower extremities NEURO: CN II-XII intact, no focal deficits, sensation to light touch intact PSYCH: normal mood/affect Integumentary: + Jaundice, otherwise no other concerning rashes/lesions/wounds noted on exposed skin surfaces.     Data Reviewed: I have personally reviewed following labs and imaging studies  CBC: Recent Labs  Lab 08/25/22 1409 08/26/22 0544 08/27/22 0512  WBC 9.3 7.2 6.4  NEUTROABS 7.9*  --  4.7  HGB 13.6 12.6* 11.7*  HCT 40.9 37.4* 36.1*  MCV 92.3 92.6 93.8  PLT 125* 101* 101*   Basic  Metabolic Panel: Recent Labs  Lab 08/25/22 1409 08/26/22 0544 08/27/22 0512  NA 134* 132* 134*  K 3.4* 4.0 4.2  CL 100 102 105  CO2 24 21* 22  GLUCOSE 117* 122* 118*  BUN '12 14 19  '$ CREATININE 0.64 0.56* 0.76  CALCIUM 9.0 8.4* 8.3*  MG  --   --  2.1  PHOS  --   --  2.8   GFR: Estimated Creatinine Clearance: 113.8 mL/min (by C-G formula based on SCr of 0.76 mg/dL). Liver Function Tests: Recent Labs  Lab 08/25/22 1409 08/26/22 0544 08/27/22 0512  AST 162* 137* 123*  ALT 340* 265* 229*  ALKPHOS 117 118 111  BILITOT 7.5* 6.5* 4.1*  PROT 7.1 6.2* 5.8*  ALBUMIN 4.1 3.3* 3.2*   Recent Labs  Lab 08/25/22 1706  LIPASE 40   No results for input(s): "AMMONIA" in the last 168 hours. Coagulation Profile: Recent Labs  Lab 08/25/22 1409  INR 1.3*   Cardiac Enzymes: No results for input(s): "CKTOTAL", "CKMB", "CKMBINDEX", "TROPONINI" in the last 168 hours. BNP (last 3 results) No results for input(s): "PROBNP" in the last 8760 hours. HbA1C: Recent Labs    08/25/22 1949  HGBA1C 5.3   CBG: Recent Labs  Lab 08/26/22 1447 08/26/22 1622 08/27/22 0735  GLUCAP 124* 132* 105*   Lipid Profile: No results for input(s): "CHOL", "HDL", "LDLCALC", "TRIG", "CHOLHDL", "LDLDIRECT" in the last 72 hours. Thyroid Function Tests: Recent Labs    08/25/22 1949  TSH 0.805   Anemia Panel: No results for input(s): "  VITAMINB12", "FOLATE", "FERRITIN", "TIBC", "IRON", "RETICCTPCT" in the last 72 hours. Sepsis Labs: No results for input(s): "PROCALCITON", "LATICACIDVEN" in the last 168 hours.  Recent Results (from the past 240 hour(s))  SARS Coronavirus 2 by RT PCR (hospital order, performed in [facility] hospital lab) *cepheid single result test* Anterior Nasal Swab     Status: None   Collection Time: 08/25/22  5:05 PM   Specimen: Anterior Nasal Swab  Result Value Ref Range Status   SARS Coronavirus 2 by RT PCR NEGATIVE NEGATIVE Final    Comment: (NOTE) SARS-CoV-2 target nucleic  acids are NOT DETECTED.  The SARS-CoV-2 RNA is generally detectable in upper and lower respiratory specimens during the acute phase of infection. The lowest concentration of SARS-CoV-2 viral copies this assay can detect is 250 copies / mL. A negative result does not preclude SARS-CoV-2 infection and should not be used as the sole basis for treatment or other patient management decisions.  A negative result may occur with improper specimen collection / handling, submission of specimen other than nasopharyngeal swab, presence of viral mutation(s) within the areas targeted by this assay, and inadequate number of viral copies (<250 copies / mL). A negative result must be combined with clinical observations, patient history, and epidemiological information.  Fact Sheet for Patients:   https://www.patel.info/  Fact Sheet for Healthcare Providers: https://hall.com/  This test is not yet approved or  cleared by the [place] FDA and has been authorized for detection and/or diagnosis of SARS-CoV-2 by FDA under an Emergency Use Authorization (EUA).  This EUA will remain in effect (meaning this test can be used) for the duration of the COVID-19 declaration under Section 564(b)(1) of the Act, 21 U.S.C. section 360bbb-3(b)(1), unless the authorization is terminated or revoked sooner.  Performed at [facility], [place] [address], [place], [place] [address]          Radiology Studies: DG ERCP  Result Date: 08/27/2022 CLINICAL DATA:  Choledocholithiasis EXAM: ERCP TECHNIQUE: Multiple spot images obtained with the fluoroscopic device and submitted for interpretation post-procedure. COMPARISON:  Ultrasound 08/25/2022 FINDINGS: A series of fluoroscopic spot images document endoscopic cannulation and opacification of the CBD with passage of balloon catheter through the CBD. Incomplete opacification and visualization of the  intrahepatic biliary tree, which appears mildly distended centrally. No extravasation evident. IMPRESSION: Endoscopic CBD cannulation and intervention as above These images were submitted for radiologic interpretation only. Please see the procedural report for the amount of contrast and the fluoroscopy time utilized. Electronically Signed   By: Lucrezia Europe M.D.   On: 08/27/2022 08:39   US Abdomen Limited RUQ (LIVER/GB)  Result Date: 08/25/2022 CLINICAL DATA:  Abnormal LFTs EXAM: ULTRASOUND ABDOMEN LIMITED RIGHT UPPER QUADRANT COMPARISON:  CT abdomen/pelvis dated 08/25/2022 FINDINGS: Gallbladder: Layering 2.5 cm gallstone. No gallbladder wall thickening or pericholecystic fluid. Negative sonographic Murphy's sign. Common bile duct: Diameter: 3 mm Liver: Echogenic hepatic parenchyma, suggesting hepatic steatosis. No focal hepatic lesion is seen. Portal vein is patent on color Doppler imaging with normal direction of blood flow towards the liver. Other: None. IMPRESSION: Cholelithiasis, without associated sonographic findings to suggest acute cholecystitis. Hepatic steatosis. Electronically Signed   By: Julian Hy M.D.   On: 08/25/2022 17:33   CT ABDOMEN PELVIS W CONTRAST  Result Date: 08/25/2022 CLINICAL DATA:  Acute hepatitis. EXAM: CT ABDOMEN AND PELVIS WITH CONTRAST TECHNIQUE: Multidetector CT imaging of the abdomen and pelvis was performed using the standard protocol following bolus administration of intravenous contrast. RADIATION DOSE  REDUCTION: This exam was performed according to the departmental dose-optimization program which includes automated exposure control, adjustment of the mA and/or kV according to patient size and/or use of iterative reconstruction technique. CONTRAST:  157m OMNIPAQUE IOHEXOL 300 MG/ML  SOLN COMPARISON:  Chest CT 08/24/2013 FINDINGS: Lower chest: Normal heart size. Coronary arterial vascular calcifications. Dependent atelectasis within the bilateral lower lobes.  Hepatobiliary: The liver is normal in size and contour. No focal hepatic lesion is identified. Gallbladder is mildly distended. Cholelithiasis. Mild central intrahepatic biliary ductal dilatation. Common bile duct measures 10 mm. At the level of the distal common bile duct in the region of the pancreatic head there is internal calcific material (image 40; series 2). Pancreas: Unremarkable Spleen: Unremarkable Adrenals/Urinary Tract: Normal adrenal glands. Kidneys are symmetric in size. There is a partially exophytic 1.3 cm cyst off the superior pole of the right kidney. No hydronephrosis. Urinary bladder is unremarkable. Stomach/Bowel: Descending and sigmoid colonic diverticulosis. No CT evidence for acute diverticulitis. The appendix is normal. No evidence for bowel obstruction. No free fluid or free intraperitoneal air. Normal morphology of the stomach. Vascular/Lymphatic: Normal caliber abdominal aorta. Peripheral calcified atherosclerotic plaque. No retroperitoneal lymphadenopathy. Reproductive: Hernia. Other: Small fat containing right inguinal hernia. Musculoskeletal: Lumbar spine degenerative changes. No aggressive or acute appearing osseous lesions. IMPRESSION: 1. There is mild central intrahepatic biliary ductal dilatation and dilatation of the common bile duct. There is internal calcific material within the distal common bile duct at the level of the pancreatic head, most compatible with choledocholithiasis. Consider further evaluation with MRCP. 2. The gallbladder is mildly distended with suggestion of mild wall thickening and surrounding fat stranding, nonspecific. Mild/early cholecystitis is not excluded. Consider further evaluation with right upper quadrant ultrasound. 3. Cholelithiasis. Electronically Signed   By: DLovey NewcomerM.D.   On: 08/25/2022 15:52        Scheduled Meds:  [MAR Hold] ascorbic acid  500 mg Oral Daily   [MAR Hold] multivitamin with minerals  1 tablet Oral Q breakfast   [MAR  Hold] potassium chloride  40 mEq Oral BID   [MAR Hold] verapamil  240 mg Oral QHS   Continuous Infusions:  sodium chloride 75 mL/hr at 08/27/22 1339   [MAR Hold] cefTRIAXone (ROCEPHIN)  IV Stopped (08/26/22 1900)   lactated ringers Stopped (08/26/22 1600)   lactated ringers 10 mL/hr at 08/27/22 1341   [MAR Hold] metronidazole 500 mg (08/27/22 0436)     LOS: 2 days    Time spent: 51 minutes spent on chart review, discussion with nursing staff, consultants, updating family and interview/physical exam; more than 50% of that time was spent in counseling and/or coordination of care.    Avalin Briley J [place] DO [facility] Available via Epic secure chat 7am-7pm After these hours, please refer to coverage provider listed on [email or web address] 08/27/2022, 1:44 PM

## 2022-08-27 NOTE — Anesthesia Procedure Notes (Signed)
Procedure Name: Intubation Date/Time: 08/27/2022 2:27 PM  Performed by: Lind Covert, CRNAPre-anesthesia Checklist: Patient identified, Emergency Drugs available, Suction available, Patient being monitored and Timeout performed Patient Re-evaluated:Patient Re-evaluated prior to induction Oxygen Delivery Method: Circle system utilized Preoxygenation: Pre-oxygenation with 100% oxygen Induction Type: IV induction Ventilation: Two handed mask ventilation required and Mask ventilation without difficulty Laryngoscope Size: Mac and 4 Grade View: Grade I Tube type: Oral Tube size: 7.5 mm Number of attempts: 1 Airway Equipment and Method: Stylet Placement Confirmation: ETT inserted through vocal cords under direct vision, positive ETCO2 and breath sounds checked- equal and bilateral Secured at: 23 cm Tube secured with: Tape Dental Injury: Teeth and Oropharynx as per pre-operative assessment

## 2022-08-27 NOTE — Anesthesia Preprocedure Evaluation (Signed)
Anesthesia Evaluation  Patient identified by MRN, date of birth, ID band Patient awake    Reviewed: Allergy & Precautions, NPO status , Patient's Chart, lab work & pertinent test results  History of Anesthesia Complications (+) history of anesthetic complications  Airway Mallampati: II  TM Distance: >3 FB Neck ROM: Full    Dental no notable dental hx.    Pulmonary sleep apnea and Continuous Positive Airway Pressure Ventilation , former smoker,    Pulmonary exam normal breath sounds clear to auscultation       Cardiovascular hypertension, Pt. on medications + CAD  Normal cardiovascular exam+ dysrhythmias Atrial Fibrillation + Valvular Problems/Murmurs AI  Rhythm:Regular Rate:Normal  Echo 8/21 1. Normal LV systolic function with visual EF 55-60%. Left ventricle cavity is normal in size. Moderate left ventricular hypertrophy. Normal global wall motion. Unable to evaluate diastolic function due to atrial fibrillation. Elevated LAP. Calculated EF 59%. 2. Left atrial cavity is severely dilated. 3. Grossly right atrial size is moderately dilated. 4. Mild (Grade I) aortic regurgitation. Aortic sclerosis without stenosis. 5. Mild tricuspid regurgitation. Mild pulmonary hypertension. RVSP measures 38 mmHg. 6. Insignificant pericardial effusion. There is no hemodynamic significance. 7. The aortic root is dilated, sinus tubular junction 3.8cm. 8. IVC is dilated with a respiratory response of <50%. 9. Compared to prior study dated 11/21/2014: Mild AR, Mild TR, mild PHTN, and aortic dilation are new findings.   Neuro/Psych negative neurological ROS     GI/Hepatic negative GI ROS, Neg liver ROS,   Endo/Other  negative endocrine ROS  Renal/GU negative Renal ROS     Musculoskeletal negative musculoskeletal ROS (+)   Abdominal (+) + obese,   Peds  Hematology  (+) Blood dyscrasia, anemia ,   Anesthesia Other Findings    Reproductive/Obstetrics                             Anesthesia Physical  Anesthesia Plan  ASA: 3  Anesthesia Plan: General   Post-op Pain Management: Dilaudid IV and Ofirmev IV (intra-op)*   Induction: Intravenous  PONV Risk Score and Plan: 2 and Ondansetron, Midazolam and Treatment may vary due to age or medical condition  Airway Management Planned: Oral ETT  Additional Equipment:   Intra-op Plan:   Post-operative Plan: Extubation in OR  Informed Consent: I have reviewed the patients History and Physical, chart, labs and discussed the procedure including the risks, benefits and alternatives for the proposed anesthesia with the patient or authorized representative who has indicated his/her understanding and acceptance.     Dental advisory given  Plan Discussed with: Anesthesiologist and CRNA  Anesthesia Plan Comments:         Anesthesia Quick Evaluation

## 2022-08-27 NOTE — Transfer of Care (Signed)
Immediate Anesthesia Transfer of Care Note  Patient: Edward Watson  Procedure(s) Performed: LAPAROSCOPIC CHOLECYSTECTOMY  Patient Location: PACU  Anesthesia Type:General  Level of Consciousness: drowsy  Airway & Oxygen Therapy: Patient Spontanous Breathing and Patient connected to face mask oxygen  Post-op Assessment: Report given to RN and Post -op Vital signs reviewed and stable  Post vital signs: Reviewed and stable  Last Vitals:  Vitals Value Taken Time  BP 142/100 08/27/22 1604  Temp    Pulse 92 08/27/22 1606  Resp 21 08/27/22 1606  SpO2 93 % 08/27/22 1606  Vitals shown include unvalidated device data.  Last Pain:  Vitals:   08/27/22 1329  TempSrc:   PainSc: 0-No pain         Complications: No notable events documented.

## 2022-08-27 NOTE — Plan of Care (Signed)

## 2022-08-28 ENCOUNTER — Encounter (HOSPITAL_COMMUNITY): Payer: Self-pay | Admitting: General Surgery

## 2022-08-28 ENCOUNTER — Encounter: Payer: Self-pay | Admitting: Internal Medicine

## 2022-08-28 DIAGNOSIS — K8050 Calculus of bile duct without cholangitis or cholecystitis without obstruction: Secondary | ICD-10-CM | POA: Diagnosis not present

## 2022-08-28 LAB — CBC
HCT: 37.4 % — ABNORMAL LOW (ref 39.0–52.0)
Hemoglobin: 12.2 g/dL — ABNORMAL LOW (ref 13.0–17.0)
MCH: 30.7 pg (ref 26.0–34.0)
MCHC: 32.6 g/dL (ref 30.0–36.0)
MCV: 94.2 fL (ref 80.0–100.0)
Platelets: 132 10*3/uL — ABNORMAL LOW (ref 150–400)
RBC: 3.97 MIL/uL — ABNORMAL LOW (ref 4.22–5.81)
RDW: 14.4 % (ref 11.5–15.5)
WBC: 8 10*3/uL (ref 4.0–10.5)
nRBC: 0 % (ref 0.0–0.2)

## 2022-08-28 LAB — COMPREHENSIVE METABOLIC PANEL
ALT: 215 U/L — ABNORMAL HIGH (ref 0–44)
AST: 111 U/L — ABNORMAL HIGH (ref 15–41)
Albumin: 3.1 g/dL — ABNORMAL LOW (ref 3.5–5.0)
Alkaline Phosphatase: 110 U/L (ref 38–126)
Anion gap: 7 (ref 5–15)
BUN: 20 mg/dL (ref 8–23)
CO2: 22 mmol/L (ref 22–32)
Calcium: 8.5 mg/dL — ABNORMAL LOW (ref 8.9–10.3)
Chloride: 105 mmol/L (ref 98–111)
Creatinine, Ser: 0.67 mg/dL (ref 0.61–1.24)
GFR, Estimated: >60 mL/min (ref >60)
Glucose, Bld: 148 mg/dL — ABNORMAL HIGH (ref 70–99)
Potassium: 4.8 mmol/L (ref 3.5–5.1)
Sodium: 134 mmol/L — ABNORMAL LOW (ref 135–145)
Total Bilirubin: 3 mg/dL — ABNORMAL HIGH (ref 0.3–1.2)
Total Protein: 6.1 g/dL — ABNORMAL LOW (ref 6.5–8.1)

## 2022-08-28 LAB — GLUCOSE, CAPILLARY: Glucose-Capillary: 154 mg/dL — ABNORMAL HIGH (ref 70–99)

## 2022-08-28 MED ORDER — OXYCODONE HCL 5 MG PO TABS: 5.0000 mg | ORAL_TABLET | Freq: Four times a day (QID) | ORAL | 0 refills | Status: DC | PRN

## 2022-08-28 MED ORDER — MORPHINE SULFATE (PF) 2 MG/ML IV SOLN: 2.0000 mg | INTRAVENOUS | Status: DC | PRN

## 2022-08-28 NOTE — Progress Notes (Signed)
[facility] Progress Note  CC: Choledocholithiasis  Subjective: He feels well. He ambulated in the hall earlier this morning.  He is tolerating a full liquid diet.  No nausea or vomiting.  He has some abdominal soreness from his cholecystectomy incisions otherwise no significant abdominal pain.  He passed a normal bowel movement yesterday prior to surgery, no BM since then but is passing gas per the rectum.  No chest pain or shortness of breath.  His wife is at the bedside.  Objective:  Vital signs in last 24 hours: Temp:  [97.4 F (36.3 C)-98.4 F (36.9 C)] 98 F (36.7 C) (10/12 0501) Pulse Rate:  [61-107] 84 (10/12 0501) Resp:  [13-20] 16 (10/12 0501) BP: (130-149)/(87-100) 130/96 (10/12 0501) SpO2:  [90 %-100 %] 90 % (10/12 0501) Weight:  [128.4 kg-130.2 kg] 130.2 kg (10/12 0500) Last BM Date : 08/27/22 General:  Alert, well-developed in NAD Heart: Irregular rhythm, no murmur. Pulm: Breath sounds clear throughout. Abdomen: Soft, nondistended.  Positive bowel sounds to all 4 quadrants. Extremities:  Without edema. Neurologic:  Alert and  oriented x 4. Grossly normal neurologically. Psych:  Alert and cooperative. Normal mood and affect.  Intake/Output from previous day: 10/11 0701 - 10/12 0700 In: 1532.2 [I.V.:1432.2; IV Piggyback:100] Out: 100 [Blood:100] Intake/Output this shift: Total I/O In: 590 [P.O.:590] Out: -   Lab Results: Recent Labs    08/26/22 0544 08/27/22 0512 08/27/22 1938 08/28/22 0522  WBC 7.2 6.4  --  8.0  HGB 12.6* 11.7* 13.1 12.2*  HCT 37.4* 36.1* 40.0 37.4*  PLT 101* 101*  --  132*   BMET Recent Labs    08/26/22 0544 08/27/22 0512 08/28/22 0522  NA 132* 134* 134*  K 4.0 4.2 4.8  CL 102 105 105  CO2 21* 22 22  GLUCOSE 122* 118* 148*  BUN 14 19 20   CREATININE 0.56* 0.76 0.67  CALCIUM 8.4* 8.3* 8.5*   LFT Recent Labs    08/28/22 0522  PROT 6.1*  ALBUMIN 3.1*  AST 111*  ALT 215*  ALKPHOS 110  BILITOT 3.0*    PT/INR Recent Labs    08/25/22 1409  LABPROT 15.7*  INR 1.3*   Hepatitis Panel No results for input(s): "HEPBSAG", "HCVAB", "HEPAIGM", "HEPBIGM" in the last 72 hours.  DG ERCP  Result Date: 08/27/2022 CLINICAL DATA:  Choledocholithiasis EXAM: ERCP TECHNIQUE: Multiple spot images obtained with the fluoroscopic device and submitted for interpretation post-procedure. COMPARISON:  Ultrasound 08/25/2022 FINDINGS: A series of fluoroscopic spot images document endoscopic cannulation and opacification of the CBD with passage of balloon catheter through the CBD. Incomplete opacification and visualization of the intrahepatic biliary tree, which appears mildly distended centrally. No extravasation evident. IMPRESSION: Endoscopic CBD cannulation and intervention as above These images were submitted for radiologic interpretation only. Please see the procedural report for the amount of contrast and the fluoroscopy time utilized. Electronically Signed   By: Lucrezia Europe M.D.   On: 08/27/2022 08:39     Assessment / Plan:  21) 76 year old male admitted to the hospital 08/25/2022 with abdominal pain, elevated LFTs and jaundice. CTAP showed mild central intrahepatic biliary ductal dilatation, CBD dilatation with evidence of choledocholithiasis at the level of the pancreatic head, cholelithiasis with possible mild early cholecystitis. RUQ sono showed cholelithiasis. S/P ERCP 08/26/2022 with sphincterotomy and balloon sweep of bile duct. No stone found but there was evidence of mucosal trauma indicating that a stone had passed with some suspicion for an ampullary tumor, likely benign).S/P laparoscopic  cholecystectomy by Dr. Marlou Starks 08/27/2022. T. Bili 4.1 -> 3.0. Alk phos 111 -> 110. T. AST 123 -> 111. ALT 229 -> 215. WBC 8.0.  Afebrile.  Hemodynamically stable. Ambulating in the hallway today. Passing gas per the rectum.  -Await ampullary path results -Advance diet as tolerated -Pain management per the  hospitalist -Hepatic panel in a.m. -Await further recommendations per Dr. Carlean Purl  2) Afib, Xarelto on hold   3) Thrombocytopenia. PLT 101 -> 132.   4) History of colon polyps.  Colonoscopy 03/19/2021 identified 15 tubular adenomatous/sessile serrated polyps removed from the colon and rectum.  A repeat colonoscopy in 1 year was recommended but has not been done.     Principal Problem:   Choledocholithiasis Active Problems:   HTN (hypertension)   Dyslipidemia   Obesity (BMI 35.0-39.9 without comorbidity)   Permanent atrial fibrillation (HCC)   OSA (obstructive sleep apnea)   Hypercoagulable state due to permanent atrial fibrillation (HCC)   OSA on CPAP   Hyponatremia   Hypokalemia     LOS: 3 days   Noralyn Pick  08/28/2022, 9:56 AM

## 2022-08-28 NOTE — Progress Notes (Signed)
1 Day Post-Op  Subjective: CC: Doing well. Soreness around incision with movement but otherwise no abdominal pain. Well controlled with po medications. Tolerating cld without n/v. Reports he is mobilizing in room well. Voiding.   Objective: Vital signs in last 24 hours: Temp:  [97.4 F (36.3 C)-98.4 F (36.9 C)] 98 F (36.7 C) (10/12 0501) Pulse Rate:  [61-107] 84 (10/12 0501) Resp:  [13-20] 16 (10/12 0501) BP: (130-149)/(87-100) 130/96 (10/12 0501) SpO2:  [90 %-100 %] 90 % (10/12 0501) Weight:  [128.4 kg-130.2 kg] 130.2 kg (10/12 0500) Last BM Date : 08/27/22  Intake/Output from previous day: 10/11 0701 - 10/12 0700 In: 1532.2 [I.V.:1432.2; IV Piggyback:100] Out: 100 [Blood:100] Intake/Output this shift: Total I/O In: 28 [P.O.:590] Out: -   PE: Gen:  Alert, NAD, pleasant Abd: Soft, ND, appropriately tender around laparoscopic incisions, no rigidity or guarding and otherwise NT, +BS. Incisions with glue intact appears well and are without drainage, bleeding, or signs of infection  Psych: A&Ox3   Lab Results:  Recent Labs    08/27/22 0512 08/27/22 1938 08/28/22 0522  WBC 6.4  --  8.0  HGB 11.7* 13.1 12.2*  HCT 36.1* 40.0 37.4*  PLT 101*  --  132*   BMET Recent Labs    08/27/22 0512 08/28/22 0522  NA 134* 134*  K 4.2 4.8  CL 105 105  CO2 22 22  GLUCOSE 118* 148*  BUN 19 20  CREATININE 0.76 0.67  CALCIUM 8.3* 8.5*   PT/INR Recent Labs    08/25/22 1409  LABPROT 15.7*  INR 1.3*   CMP     Component Value Date/Time   NA 134 (L) 08/28/2022 0522   NA 143 05/28/2020 1543   K 4.8 08/28/2022 0522   CL 105 08/28/2022 0522   CO2 22 08/28/2022 0522   GLUCOSE 148 (H) 08/28/2022 0522   BUN 20 08/28/2022 0522   BUN 10 05/28/2020 1543   CREATININE 0.67 08/28/2022 0522   CREATININE 0.76 10/31/2015 0843   CALCIUM 8.5 (L) 08/28/2022 0522   PROT 6.1 (L) 08/28/2022 0522   PROT 6.7 05/28/2020 1543   ALBUMIN 3.1 (L) 08/28/2022 0522   ALBUMIN 4.3  05/28/2020 1543   AST 111 (H) 08/28/2022 0522   ALT 215 (H) 08/28/2022 0522   ALKPHOS 110 08/28/2022 0522   BILITOT 3.0 (H) 08/28/2022 0522   BILITOT 0.7 05/28/2020 1543   GFRNONAA >60 08/28/2022 0522   GFRNONAA >89 10/27/2014 1051   GFRAA 107 05/28/2020 1543   GFRAA >89 10/27/2014 1051   Lipase     Component Value Date/Time   LIPASE 40 08/25/2022 1706    Studies/Results: DG ERCP  Result Date: 08/27/2022 CLINICAL DATA:  Choledocholithiasis EXAM: ERCP TECHNIQUE: Multiple spot images obtained with the fluoroscopic device and submitted for interpretation post-procedure. COMPARISON:  Ultrasound 08/25/2022 FINDINGS: A series of fluoroscopic spot images document endoscopic cannulation and opacification of the CBD with passage of balloon catheter through the CBD. Incomplete opacification and visualization of the intrahepatic biliary tree, which appears mildly distended centrally. No extravasation evident. IMPRESSION: Endoscopic CBD cannulation and intervention as above These images were submitted for radiologic interpretation only. Please see the procedural report for the amount of contrast and the fluoroscopy time utilized. Electronically Signed   By: Lucrezia Europe M.D.   On: 08/27/2022 08:39    Anti-infectives: Anti-infectives (From admission, onward)    Start     Dose/Rate Route Frequency Ordered Stop   08/26/22 1700  cefTRIAXone (ROCEPHIN) 2 g  in sodium chloride 0.9 % 100 mL IVPB  Status:  Discontinued        2 g 200 mL/hr over 30 Minutes Intravenous Every 24 hours 08/25/22 1911 08/27/22 1653   08/25/22 1630  cefTRIAXone (ROCEPHIN) 2 g in sodium chloride 0.9 % 100 mL IVPB        2 g 200 mL/hr over 30 Minutes Intravenous  Once 08/25/22 1623 08/25/22 1738   08/25/22 1630  metroNIDAZOLE (FLAGYL) IVPB 500 mg  Status:  Discontinued        500 mg 100 mL/hr over 60 Minutes Intravenous Every 12 hours 08/25/22 1623 08/27/22 1653        Assessment/Plan POD 1 s/p Laparoscopic Cholecystectomy  for cholelithiasis and suspected passed choledocholithiasis - 08/27/22 - ERCP 10/10 - Stone seen on MRCP had passed. LFT's downtrending. Major papilla bx negative for dysplasia/malignancy per path. - Hgb stable. Okay to restart anticoagulation - Adv diet. If tolerates okay to d/c from our standpoint. Discussed with primary. Will arrange follow up and send pain meds to the pharmacy. Discussed discharge instructions, restrictions and return/call back precautions   FEN - reg, ivf per primary VTE - SCDs, okay to resume po anticoagulation ID - Rocephin/Flagyl 10/9 - 10/11  Afib on Xarelto   LOS: 3 days    Jillyn Ledger , [facility] 08/28/2022, 12:07 PM Please see Amion for pager number during day hours 7:00am-4:30pm

## 2022-08-28 NOTE — Progress Notes (Signed)
Verbal and printed AVS discharge instructions given to Deklan and girlfriend Pam. Chayne able to teach back signs and symptoms of lapsite infection. Pam also able to teach back signs and symptoms of infection. Kobi educated on restrictions and verbalized understandment. Patient educated on lifting restrictions, not submerging in water, and picking up prescriptions at pharmacy.patient able to teach back if redness, fever, increased pain, or drainage to contact surgeons office. All personal belonging packed and taken by patient. Wheelchair to lobby.

## 2022-08-28 NOTE — Plan of Care (Signed)

## 2022-08-28 NOTE — Progress Notes (Signed)
PT Cancellation Note  Patient Details Name: Edward Watson MRN: [id number] DOB: July 10, 1946   Cancelled Treatment:    Reason Eval/Treat Not Completed: PT screened, no needs identified, will sign off (Pt is mobilizing independently on unit. No skilled Acute PT needs.)   Gwynneth Albright PT, DPT Acute Rehabilitation Services Office [phone]  08/28/22 1:10 PM

## 2022-08-28 NOTE — Discharge Instructions (Signed)
[facility]  Please arrive at least 30 min before your appointment to complete your check in paperwork.  If you are unable to arrive 30 min prior to your appointment time we may have to cancel or reschedule you. LAPAROSCOPIC SURGERY: POST OP INSTRUCTIONS Always review your discharge instruction sheet given to you by the facility where your surgery was performed. IF YOU HAVE DISABILITY OR FAMILY LEAVE FORMS, YOU MUST BRING THEM TO THE OFFICE FOR PROCESSING.   DO NOT GIVE THEM TO YOUR DOCTOR.  PAIN CONTROL  First take acetaminophen (Tylenol) to control your pain after surgery.  Follow directions on package.  Taking acetaminophen (Tylenol) regularly after surgery will help to control your pain and lower the amount of prescription pain medication you may need.  You should not take more than 3,000 mg (3 grams) of acetaminophen (Tylenol) in 24 hours.  You should not take ibuprofen (Advil), aleve, motrin, naprosyn or other NSAIDS if you have a history of stomach ulcers or chronic kidney disease.  A prescription for pain medication may be given to you upon discharge.  Take your pain medication as prescribed, if you still have uncontrolled pain after taking acetaminophen (Tylenol).  Use ice packs to help control pain. If you need a refill on your pain medication, please contact your pharmacy.  They will contact our office to request authorization. Prescriptions will not be filled after 5pm or on week-ends.  HOME MEDICATIONS Take your usually prescribed medications unless otherwise directed.  DIET You should follow a light diet the first few days after arrival home.  Be sure to include lots of fluids daily. Avoid fatty, fried foods.   CONSTIPATION It is common to experience some constipation after surgery and if you are taking pain medication.  Increasing fluid intake and taking a stool softener (such as Colace) will usually help or prevent this problem from occurring.  A mild  laxative (Milk of Magnesia or Miralax) should be taken according to package instructions if there are no bowel movements after 48 hours.  WOUND/INCISION CARE Most patients will experience some swelling and bruising in the area of the incisions.  Ice packs will help.  Swelling and bruising can take several days to resolve.  Unless discharge instructions indicate otherwise, follow guidelines below  STERI-STRIPS - you may remove your outer bandages 48 hours after surgery, and you may shower at that time.  You have steri-strips (small skin tapes) in place directly over the incision.  These strips should be left on the skin for 7-10 days.   DERMABOND/SKIN GLUE - you may shower in 24 hours.  The glue will flake off over the next 2-3 weeks. Any sutures or staples will be removed at the office during your follow-up visit.  ACTIVITIES You may resume regular (light) daily activities beginning the next day--such as daily self-care, walking, climbing stairs--gradually increasing activities as tolerated.  You may have sexual intercourse when it is comfortable.  Refrain from any heavy lifting or straining until approved by your doctor. You may drive when you are no longer taking prescription pain medication, you can comfortably wear a seatbelt, and you can safely maneuver your car and apply brakes.  FOLLOW-UP You should see your doctor in the office for a follow-up appointment approximately 2-3 weeks after your surgery.  You should have been given your post-op/follow-up appointment when your surgery was scheduled.  If you did not receive a post-op/follow-up appointment, make sure that you call for this appointment within a  day or two after you arrive home to insure a convenient appointment time.   WHEN TO CALL YOUR DOCTOR: Fever over 101.0 Inability to urinate Continued bleeding from incision. Increased pain, redness, or drainage from the incision. Increasing abdominal pain  The clinic staff is available  to answer your questions during regular business hours.  Please don't hesitate to call and ask to speak to one of the nurses for clinical concerns.  If you have a medical emergency, go to the nearest emergency room or call 911.  A surgeon from [facility] is always on call at the hospital. [address] [place], [place], [place]  [address] ? [address], [place], [place]   [phone] ? [phone] ? FAX [phone]

## 2022-08-28 NOTE — Discharge Summary (Signed)
Physician Discharge Summary  Edward Watson BTD:[id number] DOB: 1946/05/25 DOA: 08/25/2022  PCP: [name], MD  Admit date: 08/25/2022 Discharge date: 08/28/2022  Admitted From: Home Disposition: Home  Recommendations for Outpatient Follow-up:  Follow up with PCP in 1-2 weeks Follow-up with general surgery as scheduled Follow-up with gastroenterology outpatient for consideration of repeat colonoscopy given history of colon polyps Please obtain CMP/CBC in one week  Home Health: No Equipment/Devices: None  Discharge Condition: Stable CODE STATUS: Full code Diet recommendation: Heart healthy diet  History of present illness:  Edward Watson is a 76 y.o. male with past medical history significant for permanent atrial fibrillation on Xarelto, CAD, dyslipidemia, essential hypertension, obesity, OSA, ED, history of tobacco use disorder who presented to [facility] ED on 10/9 by direction of PCP for further evaluation of jaundice, dark urine, and abdominal pain.  Patient reports onset few days prior to admission.  Associated with chills.  Abdominal pain started on the left with radiation towards the right in which he felt that his abdominal muscles were "sore".   In the ED, temperature 97.8 F, HR 109, RR 18, BP 155/107, SPO2 96% on room air.  WBC 9.3, hemoglobin 13.6, platelets 125.  Sodium 134, potassium 3.4, chloride 100, CO2 24, glucose 117, BUN 12, creatinine 0.64.  Lipase 40.  AST 162, ALT 340, total bilirubin 7.5.  INR 1.3.  COVID-19 PCR negative.  Urinalysis with moderate ketones, large bilirubin.  CT abdomen/pelvis with mild central intrahepatic biliary ductal dilation and dilation of the common bile duct, internal calcific material within the distal common bile duct at the level of the pancreatic head most compatible with choledocholithiasis, gallbladder mildly distended with suggestion of mild wall thickening and surrounding fat stranding consistent with possible early/mild  cholecystitis.  Right upper quadrant ultrasound with cholelithiasis without associated sonographic findings to suggest acute cholecystitis.  GI was consulted.  Patient was started on antibiotics.  EDP consulted [facility] for admission for further evaluation management of acute choledocholithiasis.  Hospital course:  Acute choledocholithiasis with cholelithiasis Patient presenting to ED with abdominal pain associated with dark urine and skin discoloration.  Patient was noted to have elevated LFTs with a total bilirubin of 7.5.  CT abdomen/pelvis with mild central intrahepatic biliary ductal dilation and dilation of the common bile duct with calcific material within the distal common bile duct consistent with choledocholithiasis.  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  Pathology from biopsy with duodenal mucosa with architectural disarray, reactive changes and focal foveolar metaplasia and negative for dysplasia/malignancy.  Patient underwent laparoscopic cholecystectomy on 08/27/2022 by Dr. Marlou Starks.  Patient's liver enzymes and total bilirubin continues to trend down at time of discharge.  Recommend repeat CMP 1 week.   Permanent atrial fibrillation on anticoagulation Essential hypertension On verapamil 240 mg p.o. daily, Xarelto at home.   CAD Dyslipidemia May resume statin on discharge   OSA: Continue nocturnal CPAP   Hyponatremia Sodium 134 at time of discharge.  Recommend repeat CMP 1 week.   Hypokalemia Potassium repleted during hospitalization.  Potassium 4.8 at time of discharge.   Thrombocytopenia Platelet count 1132, stable.  Hx colon polyps Outpatient follow-up with gastroenterology for consideration of repeat colonoscopy.   Morbid obesity Body mass index is 37.35 kg/m.  Discussed with patient needs for aggressive lifestyle changes/weight loss as this  complicates all facets of care.  Outpatient follow-up with  PCP.  May benefit from bariatric evaluation outpatient.  Discharge Diagnoses:  Principal Problem:   Choledocholithiasis Active Problems:   HTN (hypertension)   Dyslipidemia   Obesity (BMI 35.0-39.9 without comorbidity)   Permanent atrial fibrillation (HCC)   OSA (obstructive sleep apnea)   Hypercoagulable state due to permanent atrial fibrillation (HCC)   OSA on CPAP   Hyponatremia   Hypokalemia    Discharge Instructions  Discharge Instructions     Call MD for:  difficulty breathing, headache or visual disturbances   Complete by: As directed    Call MD for:  extreme fatigue   Complete by: As directed    Call MD for:  persistant dizziness or light-headedness   Complete by: As directed    Call MD for:  persistant nausea and vomiting   Complete by: As directed    Call MD for:  severe uncontrolled pain   Complete by: As directed    Call MD for:  temperature >100.4   Complete by: As directed    Diet - low sodium heart healthy   Complete by: As directed    Increase activity slowly   Complete by: As directed       Allergies as of 08/28/2022   No Known Allergies      Medication List     TAKE these medications    Aleve 220 MG tablet Generic drug: naproxen sodium Take 220 mg by mouth 2 (two) times daily as needed (for pain).   ascorbic acid 500 MG tablet Commonly known as: VITAMIN C Take 500 mg by mouth daily.   atorvastatin 40 MG tablet Commonly known as: LIPITOR Take 1 tablet (40 mg total) by mouth daily. What changed: when to take this   Centrum Silver 50+Men Tabs Take 1 tablet by mouth daily with breakfast.   niacin 1000 MG CR tablet Commonly known as: NIASPAN Take 1 tablet by mouth once daily as directed with a low fat snack and aspirin 30 minutes prior to taking medication What changed:  how much to take how to take this when to take this additional instructions   oxyCODONE 5 MG  immediate release tablet Commonly known as: Oxy IR/ROXICODONE Take 1 tablet (5 mg total) by mouth every 6 (six) hours as needed for breakthrough pain.   verapamil 240 MG 24 hr capsule Commonly known as: VERELAN PM Take 1 capsule (240 mg total) by mouth at bedtime.   Xarelto 20 MG Tabs tablet Generic drug: rivaroxaban Take 1 tablet (20 mg total) by mouth daily.        Follow-up Information     [name], MD. Schedule an appointment as soon as possible for a visit in 1 week(s).   Specialties: Family Medicine, Sports Medicine Contact information: [place] [address] [phone]         [facility]. Schedule an appointment as soon as possible for a visit.   Specialty: Gastroenterology Why: Will need colonoscopy for history of colon polyps Contact information: [place] [phone]        [facility] [place] Follow up.   Specialty: General Surgery Why: Please call to confirm your appointment date and time. We are working hard to make this for you. Please bring a copy of your photo ID and insurance card. Please arrive 30 minutes prior to your appointment for paperwork. Contact information: [place] [address] [phone]  No Known Allergies  Consultations: [facility] General surgery   Procedures/Studies: DG ERCP  Result Date: 08/27/2022 CLINICAL DATA:  Choledocholithiasis EXAM: ERCP TECHNIQUE: Multiple spot images obtained with the fluoroscopic device and submitted for interpretation post-procedure. COMPARISON:  Ultrasound 08/25/2022 FINDINGS: A series of fluoroscopic spot images document endoscopic cannulation and opacification of the CBD with passage of balloon catheter through the CBD. Incomplete opacification and visualization of the intrahepatic biliary tree, which appears mildly distended centrally. No extravasation  evident. IMPRESSION: Endoscopic CBD cannulation and intervention as above These images were submitted for radiologic interpretation only. Please see the procedural report for the amount of contrast and the fluoroscopy time utilized. Electronically Signed   By: Lucrezia Europe M.D.   On: 08/27/2022 08:39   US Abdomen Limited RUQ (LIVER/GB)  Result Date: 08/25/2022 CLINICAL DATA:  Abnormal LFTs EXAM: ULTRASOUND ABDOMEN LIMITED RIGHT UPPER QUADRANT COMPARISON:  CT abdomen/pelvis dated 08/25/2022 FINDINGS: Gallbladder: Layering 2.5 cm gallstone. No gallbladder wall thickening or pericholecystic fluid. Negative sonographic Murphy's sign. Common bile duct: Diameter: 3 mm Liver: Echogenic hepatic parenchyma, suggesting hepatic steatosis. No focal hepatic lesion is seen. Portal vein is patent on color Doppler imaging with normal direction of blood flow towards the liver. Other: None. IMPRESSION: Cholelithiasis, without associated sonographic findings to suggest acute cholecystitis. Hepatic steatosis. Electronically Signed   By: Julian Hy M.D.   On: 08/25/2022 17:33   CT ABDOMEN PELVIS W CONTRAST  Result Date: 08/25/2022 CLINICAL DATA:  Acute hepatitis. EXAM: CT ABDOMEN AND PELVIS WITH CONTRAST TECHNIQUE: Multidetector CT imaging of the abdomen and pelvis was performed using the standard protocol following bolus administration of intravenous contrast. RADIATION DOSE REDUCTION: This exam was performed according to the departmental dose-optimization program which includes automated exposure control, adjustment of the mA and/or kV according to patient size and/or use of iterative reconstruction technique. CONTRAST:  187m OMNIPAQUE IOHEXOL 300 MG/ML  SOLN COMPARISON:  Chest CT 08/24/2013 FINDINGS: Lower chest: Normal heart size. Coronary arterial vascular calcifications. Dependent atelectasis within the bilateral lower lobes. Hepatobiliary: The liver is normal in size and contour. No focal hepatic lesion is  identified. Gallbladder is mildly distended. Cholelithiasis. Mild central intrahepatic biliary ductal dilatation. Common bile duct measures 10 mm. At the level of the distal common bile duct in the region of the pancreatic head there is internal calcific material (image 40; series 2). Pancreas: Unremarkable Spleen: Unremarkable Adrenals/Urinary Tract: Normal adrenal glands. Kidneys are symmetric in size. There is a partially exophytic 1.3 cm cyst off the superior pole of the right kidney. No hydronephrosis. Urinary bladder is unremarkable. Stomach/Bowel: Descending and sigmoid colonic diverticulosis. No CT evidence for acute diverticulitis. The appendix is normal. No evidence for bowel obstruction. No free fluid or free intraperitoneal air. Normal morphology of the stomach. Vascular/Lymphatic: Normal caliber abdominal aorta. Peripheral calcified atherosclerotic plaque. No retroperitoneal lymphadenopathy. Reproductive: Hernia. Other: Small fat containing right inguinal hernia. Musculoskeletal: Lumbar spine degenerative changes. No aggressive or acute appearing osseous lesions. IMPRESSION: 1. There is mild central intrahepatic biliary ductal dilatation and dilatation of the common bile duct. There is internal calcific material within the distal common bile duct at the level of the pancreatic head, most compatible with choledocholithiasis. Consider further evaluation with MRCP. 2. The gallbladder is mildly distended with suggestion of mild wall thickening and surrounding fat stranding, nonspecific. Mild/early cholecystitis is not excluded. Consider further evaluation with right upper quadrant ultrasound. 3. Cholelithiasis. Electronically Signed   By: DLovey NewcomerM.D.   On: 08/25/2022 15:52  Subjective: Patient seen examined at bedside, resting company.  Girlfriend present.  Tolerating diet.  Denies pain.  Seen by general surgery this morning okay for discharge home.  No other questions or concerns at this time.   Denies headache, no dizziness, no chest pain, no palpitations, no shortness of breath, no fever/chills/night sweats, no nausea/vomiting/diarrhea, no abdominal pain, no focal weakness, no cough/congestion, no fatigue, no paresthesias.  No acute events overnight per nursing staff.  Discharge Exam: Vitals:   08/27/22 1938 08/28/22 0501  BP: (!) 149/98 (!) 130/96  Pulse: 61 84  Resp: 20 16  Temp: 98.4 F (36.9 C) 98 F (36.7 C)  SpO2: 94% 90%   Vitals:   08/27/22 1650 08/27/22 1938 08/28/22 0500 08/28/22 0501  BP: (!) 138/94 (!) 149/98  (!) 130/96  Pulse: (!) 107 61  84  Resp: '20 20  16  '$ Temp: (!) 97.4 F (36.3 C) 98.4 F (36.9 C)  98 F (36.7 C)  TempSrc: Oral Oral  Oral  SpO2: 93% 94%  90%  Weight:   130.2 kg   Height:        Physical Exam: GEN: NAD, alert and oriented x 3, obese HEENT: NCAT, PERRL, EOMI, mild scleral icterus, MMM PULM: CTAB w/o wheezes/crackles, normal respiratory effort, on room air CV: RRR w/o M/G/R GI: abd soft, NTND, NABS, no R/G/M, surgical port sites noted to abdomen with Dermabond in place MSK: no peripheral edema, muscle strength globally intact 5/5 bilateral upper/lower extremities NEURO: CN II-XII intact, no focal deficits, sensation to light touch intact PSYCH: normal mood/affect Integumentary: Slight jaundice to the skin otherwise no other concerning rashes/lesions/wounds.     The results of significant diagnostics from this hospitalization (including imaging, microbiology, ancillary and laboratory) are listed below for reference.     Microbiology: Recent Results (from the past 240 hour(s))  SARS Coronavirus 2 by RT PCR (hospital order, performed in [facility] hospital lab) *cepheid single result test* Anterior Nasal Swab     Status: None   Collection Time: 08/25/22  5:05 PM   Specimen: Anterior Nasal Swab  Result Value Ref Range Status   SARS Coronavirus 2 by RT PCR NEGATIVE NEGATIVE Final    Comment: (NOTE) SARS-CoV-2 target nucleic  acids are NOT DETECTED.  The SARS-CoV-2 RNA is generally detectable in upper and lower respiratory specimens during the acute phase of infection. The lowest concentration of SARS-CoV-2 viral copies this assay can detect is 250 copies / mL. A negative result does not preclude SARS-CoV-2 infection and should not be used as the sole basis for treatment or other patient management decisions.  A negative result may occur with improper specimen collection / handling, submission of specimen other than nasopharyngeal swab, presence of viral mutation(s) within the areas targeted by this assay, and inadequate number of viral copies (<250 copies / mL). A negative result must be combined with clinical observations, patient history, and epidemiological information.  Fact Sheet for Patients:   https://www.patel.info/  Fact Sheet for Healthcare Providers: https://hall.com/  This test is not yet approved or  cleared by the [place] FDA and has been authorized for detection and/or diagnosis of SARS-CoV-2 by FDA under an Emergency Use Authorization (EUA).  This EUA will remain in effect (meaning this test can be used) for the duration of the COVID-19 declaration under Section 564(b)(1) of the Act, 21 U.S.C. section 360bbb-3(b)(1), unless the authorization is terminated or revoked sooner.  Performed at [facility], [address] [place], [place]  [address]      Labs: BNP (last 3 results) No results for input(s): "BNP" in the last 8760 hours. Basic Metabolic Panel: Recent Labs  Lab 08/25/22 1409 08/26/22 0544 08/27/22 0512 08/28/22 0522  NA 134* 132* 134* 134*  K 3.4* 4.0 4.2 4.8  CL 100 102 105 105  CO2 24 21* 22 22  GLUCOSE 117* 122* 118* 148*  BUN '12 14 19 20  '$ CREATININE 0.64 0.56* 0.76 0.67  CALCIUM 9.0 8.4* 8.3* 8.5*  MG  --   --  2.1  --   PHOS  --   --  2.8  --    Liver Function Tests: Recent Labs  Lab  08/25/22 1409 08/26/22 0544 08/27/22 0512 08/28/22 0522  AST 162* 137* 123* 111*  ALT 340* 265* 229* 215*  ALKPHOS 117 118 111 110  BILITOT 7.5* 6.5* 4.1* 3.0*  PROT 7.1 6.2* 5.8* 6.1*  ALBUMIN 4.1 3.3* 3.2* 3.1*   Recent Labs  Lab 08/25/22 1706  LIPASE 40   No results for input(s): "AMMONIA" in the last 168 hours. CBC: Recent Labs  Lab 08/25/22 1409 08/26/22 0544 08/27/22 0512 08/27/22 1938 08/28/22 0522  WBC 9.3 7.2 6.4  --  8.0  NEUTROABS 7.9*  --  4.7  --   --   HGB 13.6 12.6* 11.7* 13.1 12.2*  HCT 40.9 37.4* 36.1* 40.0 37.4*  MCV 92.3 92.6 93.8  --  94.2  PLT 125* 101* 101*  --  132*   Cardiac Enzymes: No results for input(s): "CKTOTAL", "CKMB", "CKMBINDEX", "TROPONINI" in the last 168 hours. BNP: Invalid input(s): "POCBNP" CBG: Recent Labs  Lab 08/26/22 1447 08/26/22 1622 08/27/22 0735 08/28/22 0746  GLUCAP 124* 132* 105* 154*   D-Dimer No results for input(s): "DDIMER" in the last 72 hours. Hgb A1c Recent Labs    08/25/22 1949  HGBA1C 5.3   Lipid Profile No results for input(s): "CHOL", "HDL", "LDLCALC", "TRIG", "CHOLHDL", "LDLDIRECT" in the last 72 hours. Thyroid function studies Recent Labs    08/25/22 1949  TSH 0.805   Anemia work up No results for input(s): "VITAMINB12", "FOLATE", "FERRITIN", "TIBC", "IRON", "RETICCTPCT" in the last 72 hours. Urinalysis    Component Value Date/Time   BILIRUBINUR large (A) 08/25/2022 1215   BILIRUBINUR Negative 10/27/2014 1110   KETONESUR moderate (40) (A) 08/25/2022 1215   PROTEINUR =30 (A) 08/25/2022 1215   PROTEINUR Negative 10/27/2014 1110   UROBILINOGEN 0.2 08/25/2022 1215   NITRITE Negative 08/25/2022 1215   NITRITE Negative 10/27/2014 1110   LEUKOCYTESUR Negative 08/25/2022 1215   Sepsis Labs Recent Labs  Lab 08/25/22 1409 08/26/22 0544 08/27/22 0512 08/28/22 0522  WBC 9.3 7.2 6.4 8.0   Microbiology Recent Results (from the past 240 hour(s))  SARS Coronavirus 2 by RT PCR  (hospital order, performed in [facility] hospital lab) *cepheid single result test* Anterior Nasal Swab     Status: None   Collection Time: 08/25/22  5:05 PM   Specimen: Anterior Nasal Swab  Result Value Ref Range Status   SARS Coronavirus 2 by RT PCR NEGATIVE NEGATIVE Final    Comment: (NOTE) SARS-CoV-2 target nucleic acids are NOT DETECTED.  The SARS-CoV-2 RNA is generally detectable in upper and lower respiratory specimens during the acute phase of infection. The lowest concentration of SARS-CoV-2 viral copies this assay can detect is 250 copies / mL. A negative result does not preclude SARS-CoV-2 infection and should not be used as the sole basis for treatment or other patient management decisions.  A negative result may occur with improper specimen collection / handling, submission of specimen other than nasopharyngeal swab, presence of viral mutation(s) within the areas targeted by this assay, and inadequate number of viral copies (<250 copies / mL). A negative result must be combined with clinical observations, patient history, and epidemiological information.  Fact Sheet for Patients:   https://www.patel.info/  Fact Sheet for Healthcare Providers: https://hall.com/  This test is not yet approved or  cleared by the [place] FDA and has been authorized for detection and/or diagnosis of SARS-CoV-2 by FDA under an Emergency Use Authorization (EUA).  This EUA will remain in effect (meaning this test can be used) for the duration of the COVID-19 declaration under Section 564(b)(1) of the Act, 21 U.S.C. section 360bbb-3(b)(1), unless the authorization is terminated or revoked sooner.  Performed at [facility], [place] [address] [place], [place] [address]      Time coordinating discharge: Over 30 minutes  SIGNED:   Keshawna Dix J [place] DO  [facility] 08/28/2022, 1:13 PM

## 2022-08-29 ENCOUNTER — Encounter (HOSPITAL_COMMUNITY): Payer: Self-pay | Admitting: Internal Medicine

## 2022-08-29 LAB — SURGICAL PATHOLOGY

## 2022-09-01 NOTE — Anesthesia Postprocedure Evaluation (Signed)
Anesthesia Post Note  Patient: Edward Watson  Procedure(s) Performed: [place]     Patient location during evaluation: PACU Anesthesia Type: General Level of consciousness: awake Pain management: pain level controlled Vital Signs Assessment: post-procedure vital signs reviewed and stable Respiratory status: spontaneous breathing Cardiovascular status: stable Postop Assessment: no apparent nausea or vomiting Anesthetic complications: no   No notable events documented.  Last Vitals:  Vitals:   08/27/22 1938 08/28/22 0501  BP: (!) 149/98 (!) 130/96  Pulse: 61 84  Resp: 20 16  Temp: 36.9 C 36.7 C  SpO2: 94% 90%    Last Pain:  Vitals:   08/28/22 0939  TempSrc:   PainSc: 6                  John F Salome Arnt

## 2022-09-03 ENCOUNTER — Ambulatory Visit (INDEPENDENT_AMBULATORY_CARE_PROVIDER_SITE_OTHER): Payer: PPO | Admitting: Family Medicine

## 2022-09-03 ENCOUNTER — Telehealth: Payer: Self-pay

## 2022-09-03 ENCOUNTER — Encounter: Payer: Self-pay | Admitting: Family Medicine

## 2022-09-03 VITALS — BP 118/70 | HR 80 | Temp 98.5°F | Ht 73.0 in | Wt 269.6 lb

## 2022-09-03 DIAGNOSIS — D696 Thrombocytopenia, unspecified: Secondary | ICD-10-CM

## 2022-09-03 DIAGNOSIS — E876 Hypokalemia: Secondary | ICD-10-CM

## 2022-09-03 DIAGNOSIS — L231 Allergic contact dermatitis due to adhesives: Secondary | ICD-10-CM

## 2022-09-03 DIAGNOSIS — Z48815 Encounter for surgical aftercare following surgery on the digestive system: Secondary | ICD-10-CM

## 2022-09-03 DIAGNOSIS — K8070 Calculus of gallbladder and bile duct without cholecystitis without obstruction: Secondary | ICD-10-CM

## 2022-09-03 DIAGNOSIS — E871 Hypo-osmolality and hyponatremia: Secondary | ICD-10-CM

## 2022-09-03 DIAGNOSIS — R7989 Other specified abnormal findings of blood chemistry: Secondary | ICD-10-CM | POA: Diagnosis not present
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
## 2022-09-03 NOTE — Patient Outreach (Signed)
  Care Coordination [facility] Note Transition Care Management Follow-up Telephone Call Date of discharge and from where: 08/28/22-[facility] How have you been since you were released from the hospital? Patient voices he is doing and surprised at how he is doing. Denies any Gi issues. Appetite reported as good. No N & V and states he is having regular BMs. He states he is getting ready to leave to go on vacation to [place] for a few days and leaves tomorrow. Any questions or concerns? No  Items Reviewed: Did the pt receive and understand the discharge instructions provided? Yes  Medications obtained and verified? Yes  Other? Yes  Any new allergies since your discharge? Yes  Dietary orders reviewed? Yes Do you have support at home? Yes   Home Care and Equipment/Supplies: Were home health services ordered? not applicable If so, what is the name of the agency? N/A  Has the agency set up a time to come to the patient's home? not applicable Were any new equipment or medical supplies ordered?  No What is the name of the medical supply agency? N/A Were you able to get the supplies/equipment? not applicable Do you have any questions related to the use of the equipment or supplies? No  Functional Questionnaire: (I = Independent and D = Dependent) ADLs: I  Bathing/Dressing- I  Meal Prep- I  Eating- I  Maintaining continence- I  Transferring/Ambulation- I  Managing Meds- I  Follow up appointments reviewed:  PCP Hospital f/u appt confirmed? Yes  Scheduled to see PCP on 09/03/22 @ 1:40pm. [facility] f/u appt confirmed? Yes  Scheduled to see [facility] on 10/31/22 @ 10:10am. Are transportation arrangements needed? No  If their condition worsens, is the pt aware to call PCP or go to the Emergency Dept.? Yes Was the patient provided with contact information for the PCP's office or ED? Yes Was to pt encouraged to call back with questions or concerns? Yes  SDOH assessments  and interventions completed:   Yes  Care Coordination Interventions Activated:  Yes   Care Coordination Interventions:  Education provided    Encounter Outcome:  Pt. Visit Completed     Enzo Montgomery, RN,BSN,CCM [facility] Telephonic Care Management Coordinator Direct Phone: [phone] Toll Free: [phone] Fax: [phone]

## 2022-09-03 NOTE — Patient Instructions (Addendum)
Glad you are doing well.  I will check some labs today but expect those to be improved. It appears there is a area of contact dermatitis or irritation from the adhesive on the bandage.  That should improve with a little bit of time.  Keep area clean with soap and water, especially if blisters rupture should be clean, covered but avoid prior bandage adhesive.  Follow-up if any increased redness, new rash, or any return of discharge from the wounds.  Take care!

## 2022-09-04 ENCOUNTER — Encounter: Payer: Self-pay | Admitting: Family Medicine

## 2022-09-04 LAB — COMPREHENSIVE METABOLIC PANEL
ALT: 115 U/L — ABNORMAL HIGH (ref 0–53)
AST: 60 U/L — ABNORMAL HIGH (ref 0–37)
Albumin: 3.9 g/dL (ref 3.5–5.2)
Alkaline Phosphatase: 112 U/L (ref 39–117)
BUN: 11 mg/dL (ref 6–23)
CO2: 29 mEq/L (ref 19–32)
Calcium: 9.6 mg/dL (ref 8.4–10.5)
Chloride: 102 mEq/L (ref 96–112)
Creatinine, Ser: 0.71 mg/dL (ref 0.40–1.50)
GFR: 90.58 mL/min (ref >60.00)
Glucose, Bld: 89 mg/dL (ref 70–99)
Potassium: 4.9 mEq/L (ref 3.5–5.1)
Sodium: 138 mEq/L (ref 135–145)
Total Bilirubin: 1.6 mg/dL — ABNORMAL HIGH (ref 0.2–1.2)
Total Protein: 6.9 g/dL (ref 6.0–8.3)

## 2022-09-15 ENCOUNTER — Other Ambulatory Visit: Payer: Self-pay

## 2022-09-15 DIAGNOSIS — I4891 Unspecified atrial fibrillation: Secondary | ICD-10-CM

## 2022-09-15 DIAGNOSIS — E785 Hyperlipidemia, unspecified: Secondary | ICD-10-CM

## 2022-09-15 MED ORDER — ATORVASTATIN CALCIUM 40 MG PO TABS: 40.0000 mg | ORAL_TABLET | Freq: Every day | ORAL | 2 refills | Status: DC

## 2022-09-15 MED ORDER — VERAPAMIL HCL ER 240 MG PO CP24: 240.0000 mg | ORAL_CAPSULE | Freq: Every day | ORAL | 2 refills | Status: DC

## 2022-09-19 ENCOUNTER — Other Ambulatory Visit: Payer: Self-pay

## 2022-09-19 MED ORDER — XARELTO 20 MG PO TABS: 20.0000 mg | ORAL_TABLET | Freq: Every day | ORAL | 0 refills | Status: DC

## 2022-09-22 DIAGNOSIS — D692 Other nonthrombocytopenic purpura: Secondary | ICD-10-CM | POA: Diagnosis not present

## 2022-09-22 DIAGNOSIS — L905 Scar conditions and fibrosis of skin: Secondary | ICD-10-CM | POA: Diagnosis not present

## 2022-09-22 DIAGNOSIS — Z85828 Personal history of other malignant neoplasm of skin: Secondary | ICD-10-CM | POA: Diagnosis not present

## 2022-09-22 DIAGNOSIS — L821 Other seborrheic keratosis: Secondary | ICD-10-CM | POA: Diagnosis not present

## 2022-09-22 DIAGNOSIS — L570 Actinic keratosis: Secondary | ICD-10-CM | POA: Diagnosis not present

## 2022-09-22 DIAGNOSIS — L812 Freckles: Secondary | ICD-10-CM | POA: Diagnosis not present

## 2022-10-01 ENCOUNTER — Ambulatory Visit (INDEPENDENT_AMBULATORY_CARE_PROVIDER_SITE_OTHER): Payer: PPO | Admitting: *Deleted

## 2022-10-01 DIAGNOSIS — Z122 Encounter for screening for malignant neoplasm of respiratory organs: Secondary | ICD-10-CM | POA: Diagnosis not present

## 2022-10-01 DIAGNOSIS — Z0000 Encounter for general adult medical examination without abnormal findings: Secondary | ICD-10-CM
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
## 2022-10-01 NOTE — Patient Instructions (Signed)
Mr. Sellitto , Thank you for taking time to come for your Medicare Wellness Visit. I appreciate your ongoing commitment to your health goals. Please review the following plan we discussed and let me know if I can assist you in the future.   These are the goals we discussed:  Goals      Weight (lb) < 200 lb (90.7 kg)     Weight (lb) < 250 lb (113.4 kg)     Patient states that he wants to try to lose weight and weigh under 250 lbs in the near future.        This is a list of the screening recommended for you and due dates:  Health Maintenance  Topic Date Due   Screening for Lung Cancer  08/24/2014   COVID-19 Vaccine (4 - Pfizer series) 12/21/2020   Flu Shot  02/15/2023*   Colon Cancer Screening  04/01/2023*   Tetanus Vaccine  04/01/2023*   Medicare Annual Wellness Visit  10/02/2023   Pneumonia Vaccine  Completed   Hepatitis C Screening: USPSTF Recommendation to screen - Ages 18-79 yo.  Completed   Zoster (Shingles) Vaccine  Completed   HPV Vaccine  Aged Out  *Topic was postponed. The date shown is not the original due date.    Advanced directives: Education provided  Conditions/risks identified:   Next appointment: Follow up in one year for your annual wellness visit. [id number]  @ 8:40  Preventive Care 74 Years and Older, Male  Preventive care refers to lifestyle choices and visits with your health care provider that can promote health and wellness. What does preventive care include? A yearly physical exam. This is also called an annual well check. Dental exams once or twice a year. Routine eye exams. Ask your health care provider how often you should have your eyes checked. Personal lifestyle choices, including: Daily care of your teeth and gums. Regular physical activity. Eating a healthy diet. Avoiding tobacco and drug use. Limiting alcohol use. Practicing safe sex. Taking low doses of aspirin every day. Taking vitamin and mineral supplements as recommended by your  health care provider. What happens during an annual well check? The services and screenings done by your health care provider during your annual well check will depend on your age, overall health, lifestyle risk factors, and family history of disease. Counseling  Your health care provider may ask you questions about your: Alcohol use. Tobacco use. Drug use. Emotional well-being. Home and relationship well-being. Sexual activity. Eating habits. History of falls. Memory and ability to understand (cognition). Work and work Statistician. Screening  You may have the following tests or measurements: Height, weight, and BMI. Blood pressure. Lipid and cholesterol levels. These may be checked every 5 years, or more frequently if you are over 44 years old. Skin check. Lung cancer screening. You may have this screening every year starting at age 44 if you have a 30-pack-year history of smoking and currently smoke or have quit within the past 15 years. Fecal occult blood test (FOBT) of the stool. You may have this test every year starting at age 66. Flexible sigmoidoscopy or colonoscopy. You may have a sigmoidoscopy every 5 years or a colonoscopy every 10 years starting at age 17. Prostate cancer screening. Recommendations will vary depending on your family history and other risks. Hepatitis C blood test. Hepatitis B blood test. Sexually transmitted disease (STD) testing. Diabetes screening. This is done by checking your blood sugar (glucose) after you have not eaten for a while (  fasting). You may have this done every 1-3 years. Abdominal aortic aneurysm (AAA) screening. You may need this if you are a current or former smoker. Osteoporosis. You may be screened starting at age 45 if you are at high risk. Talk with your health care provider about your test results, treatment options, and if necessary, the need for more tests. Vaccines  Your health care provider may recommend certain vaccines, such  as: Influenza vaccine. This is recommended every year. Tetanus, diphtheria, and acellular pertussis (Tdap, Td) vaccine. You may need a Td booster every 10 years. Zoster vaccine. You may need this after age 59. Pneumococcal 13-valent conjugate (PCV13) vaccine. One dose is recommended after age 72. Pneumococcal polysaccharide (PPSV23) vaccine. One dose is recommended after age 36. Talk to your health care provider about which screenings and vaccines you need and how often you need them. This information is not intended to replace advice given to you by your health care provider. Make sure you discuss any questions you have with your health care provider. Document Released: 11/30/2015 Document Revised: 07/23/2016 Document Reviewed: 09/04/2015 Elsevier Interactive Patient Education  2017 [employer] Prevention in the Home Falls can cause injuries. They can happen to people of all ages. There are many things you can do to make your home safe and to help prevent falls. What can I do on the outside of my home? Regularly fix the edges of walkways and driveways and fix any cracks. Remove anything that might make you trip as you walk through a door, such as a raised step or threshold. Trim any bushes or trees on the path to your home. Use bright outdoor lighting. Clear any walking paths of anything that might make someone trip, such as rocks or tools. Regularly check to see if handrails are loose or broken. Make sure that both sides of any steps have handrails. Any raised decks and porches should have guardrails on the edges. Have any leaves, snow, or ice cleared regularly. Use sand or salt on walking paths during winter. Clean up any spills in your garage right away. This includes oil or grease spills. What can I do in the bathroom? Use night lights. Install grab bars by the toilet and in the tub and shower. Do not use towel bars as grab bars. Use non-skid mats or decals in the tub or  shower. If you need to sit down in the shower, use a plastic, non-slip stool. Keep the floor dry. Clean up any water that spills on the floor as soon as it happens. Remove soap buildup in the tub or shower regularly. Attach bath mats securely with double-sided non-slip rug tape. Do not have throw rugs and other things on the floor that can make you trip. What can I do in the bedroom? Use night lights. Make sure that you have a light by your bed that is easy to reach. Do not use any sheets or blankets that are too big for your bed. They should not hang down onto the floor. Have a firm chair that has side arms. You can use this for support while you get dressed. Do not have throw rugs and other things on the floor that can make you trip. What can I do in the kitchen? Clean up any spills right away. Avoid walking on wet floors. Keep items that you use a lot in easy-to-reach places. If you need to reach something above you, use a strong step stool that has a grab bar.  Keep electrical cords out of the way. Do not use floor polish or wax that makes floors slippery. If you must use wax, use non-skid floor wax. Do not have throw rugs and other things on the floor that can make you trip. What can I do with my stairs? Do not leave any items on the stairs. Make sure that there are handrails on both sides of the stairs and use them. Fix handrails that are broken or loose. Make sure that handrails are as long as the stairways. Check any carpeting to make sure that it is firmly attached to the stairs. Fix any carpet that is loose or worn. Avoid having throw rugs at the top or bottom of the stairs. If you do have throw rugs, attach them to the floor with carpet tape. Make sure that you have a light switch at the top of the stairs and the bottom of the stairs. If you do not have them, ask someone to add them for you. What else can I do to help prevent falls? Wear shoes that: Do not have high heels. Have  rubber bottoms. Are comfortable and fit you well. Are closed at the toe. Do not wear sandals. If you use a stepladder: Make sure that it is fully opened. Do not climb a closed stepladder. Make sure that both sides of the stepladder are locked into place. Ask someone to hold it for you, if possible. Clearly mark and make sure that you can see: Any grab bars or handrails. First and last steps. Where the edge of each step is. Use tools that help you move around (mobility aids) if they are needed. These include: Canes. Walkers. Scooters. Crutches. Turn on the lights when you go into a dark area. Replace any light bulbs as soon as they burn out. Set up your furniture so you have a clear path. Avoid moving your furniture around. If any of your floors are uneven, fix them. If there are any pets around you, be aware of where they are. Review your medicines with your doctor. Some medicines can make you feel dizzy. This can increase your chance of falling. Ask your doctor what other things that you can do to help prevent falls. This information is not intended to replace advice given to you by your health care provider. Make sure you discuss any questions you have with your health care provider. Document Released: 08/30/2009 Document Revised: 04/10/2016 Document Reviewed: 12/08/2014 Elsevier Interactive Patient Education  2017 [employer].

## 2022-10-03 ENCOUNTER — Ambulatory Visit (INDEPENDENT_AMBULATORY_CARE_PROVIDER_SITE_OTHER): Payer: PPO | Admitting: Family Medicine

## 2022-10-03 ENCOUNTER — Encounter: Payer: Self-pay | Admitting: Family Medicine

## 2022-10-03 VITALS — BP 128/72 | HR 75 | Temp 98.0°F | Ht 72.5 in | Wt 269.8 lb

## 2022-10-03 DIAGNOSIS — I4891 Unspecified atrial fibrillation: Secondary | ICD-10-CM | POA: Diagnosis not present

## 2022-10-03 DIAGNOSIS — E785 Hyperlipidemia, unspecified: Secondary | ICD-10-CM | POA: Diagnosis not present

## 2022-10-03 DIAGNOSIS — Z0000 Encounter for general adult medical examination without abnormal findings: Secondary | ICD-10-CM | POA: Diagnosis not present

## 2022-10-03 LAB — CBC
HCT: 41.2 % (ref 39.0–52.0)
Hemoglobin: 13.9 g/dL (ref 13.0–17.0)
MCHC: 33.7 g/dL (ref 30.0–36.0)
MCV: 91.3 fl (ref 78.0–100.0)
Platelets: 193 10*3/uL (ref 150.0–400.0)
RBC: 4.51 Mil/uL (ref 4.22–5.81)
RDW: 14.4 % (ref 11.5–15.5)
WBC: 6.7 10*3/uL (ref 4.0–10.5)

## 2022-10-03 LAB — LIPID PANEL
Cholesterol: 118 mg/dL (ref 0–200)
HDL: 37.4 mg/dL — ABNORMAL LOW (ref >39.00)
LDL Cholesterol: 55 mg/dL (ref 0–99)
NonHDL: 80.82
Total CHOL/HDL Ratio: 3
Triglycerides: 128 mg/dL (ref 0.0–149.0)
VLDL: 25.6 mg/dL (ref 0.0–40.0)

## 2022-10-03 LAB — COMPREHENSIVE METABOLIC PANEL
ALT: 25 U/L (ref 0–53)
AST: 24 U/L (ref 0–37)
Albumin: 4.4 g/dL (ref 3.5–5.2)
Alkaline Phosphatase: 79 U/L (ref 39–117)
BUN: 9 mg/dL (ref 6–23)
CO2: 31 mEq/L (ref 19–32)
Calcium: 9.3 mg/dL (ref 8.4–10.5)
Chloride: 103 mEq/L (ref 96–112)
Creatinine, Ser: 0.61 mg/dL (ref 0.40–1.50)
GFR: 94.77 mL/min (ref >60.00)
Glucose, Bld: 105 mg/dL — ABNORMAL HIGH (ref 70–99)
Potassium: 4.4 mEq/L (ref 3.5–5.1)
Sodium: 139 mEq/L (ref 135–145)
Total Bilirubin: 1.5 mg/dL — ABNORMAL HIGH (ref 0.2–1.2)
Total Protein: 6.6 g/dL (ref 6.0–8.3)

## 2022-10-03 MED ORDER — NIACIN ER (ANTIHYPERLIPIDEMIC) 1000 MG PO TBCR: EXTENDED_RELEASE_TABLET | ORAL | 1 refills | Status: DC
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
## 2022-10-03 NOTE — Patient Instructions (Signed)
Restarting exercise should help with weight.  Goal initially would be having a body mass index under 35.  You are not too far from that level.   I will check labs today and if any concerns I will let you know.  No medication changes today.  Thanks for coming in and take care.  Preventive Care 30 Years and Older, Male Preventive care refers to lifestyle choices and visits with your health care provider that can promote health and wellness. Preventive care visits are also called wellness exams. What can I expect for my preventive care visit? Counseling During your preventive care visit, your health care provider may ask about your: Medical history, including: Past medical problems. Family medical history. History of falls. Current health, including: Emotional well-being. Home life and relationship well-being. Sexual activity. Memory and ability to understand (cognition). Lifestyle, including: Alcohol, nicotine or tobacco, and drug use. Access to firearms. Diet, exercise, and sleep habits. Work and work Statistician. Sunscreen use. Safety issues such as seatbelt and bike helmet use. Physical exam Your health care provider will check your: Height and weight. These may be used to calculate your BMI (body mass index). BMI is a measurement that tells if you are at a healthy weight. Waist circumference. This measures the distance around your waistline. This measurement also tells if you are at a healthy weight and may help predict your risk of certain diseases, such as type 2 diabetes and high blood pressure. Heart rate and blood pressure. Body temperature. Skin for abnormal spots. What immunizations do I need?  Vaccines are usually given at various ages, according to a schedule. Your health care provider will recommend vaccines for you based on your age, medical history, and lifestyle or other factors, such as travel or where you work. What tests do I need? Screening Your health care  provider may recommend screening tests for certain conditions. This may include: Lipid and cholesterol levels. Diabetes screening. This is done by checking your blood sugar (glucose) after you have not eaten for a while (fasting). Hepatitis C test. Hepatitis B test. HIV (human immunodeficiency virus) test. STI (sexually transmitted infection) testing, if you are at risk. Lung cancer screening. Colorectal cancer screening. Prostate cancer screening. Abdominal aortic aneurysm (AAA) screening. You may need this if you are a current or former smoker. Talk with your health care provider about your test results, treatment options, and if necessary, the need for more tests. Follow these instructions at home: Eating and drinking  Eat a diet that includes fresh fruits and vegetables, whole grains, lean protein, and low-fat dairy products. Limit your intake of foods with high amounts of sugar, saturated fats, and salt. Take vitamin and mineral supplements as recommended by your health care provider. Do not drink alcohol if your health care provider tells you not to drink. If you drink alcohol: Limit how much you have to 0-2 drinks a day. Know how much alcohol is in your drink. In the [place] one drink equals one 12 oz bottle of beer (355 mL), one 5 oz glass of wine (148 mL), or one 1 oz glass of hard liquor (44 mL). Lifestyle Brush your teeth every morning and night with fluoride toothpaste. Floss one time each day. Exercise for at least 30 minutes 5 or more days each week. Do not use any products that contain nicotine or tobacco. These products include cigarettes, chewing tobacco, and vaping devices, such as e-cigarettes. If you need help quitting, ask your health care provider. Do not use  drugs. If you are sexually active, practice safe sex. Use a condom or other form of protection to prevent STIs. Take aspirin only as told by your health care provider. Make sure that you understand how much to  take and what form to take. Work with your health care provider to find out whether it is safe and beneficial for you to take aspirin daily. Ask your health care provider if you need to take a cholesterol-lowering medicine (statin). Find healthy ways to manage stress, such as: Meditation, yoga, or listening to music. Journaling. Talking to a trusted person. Spending time with friends and family. Safety Always wear your seat belt while driving or riding in a vehicle. Do not drive: If you have been drinking alcohol. Do not ride with someone who has been drinking. When you are tired or distracted. While texting. If you have been using any mind-altering substances or drugs. Wear a helmet and other protective equipment during sports activities. If you have firearms in your house, make sure you follow all gun safety procedures. Minimize exposure to UV radiation to reduce your risk of skin cancer. What's next? Visit your health care provider once a year for an annual wellness visit. Ask your health care provider how often you should have your eyes and teeth checked. Stay up to date on all vaccines. This information is not intended to replace advice given to you by your health care provider. Make sure you discuss any questions you have with your health care provider. Document Revised: 05/01/2021 Document Reviewed: 05/01/2021 Elsevier Patient Education  [place].

## 2022-10-08 ENCOUNTER — Other Ambulatory Visit: Payer: Self-pay | Admitting: Family Medicine

## 2022-10-08 DIAGNOSIS — E785 Hyperlipidemia, unspecified: Secondary | ICD-10-CM

## 2022-10-13 ENCOUNTER — Encounter (HOSPITAL_COMMUNITY): Payer: Self-pay

## 2022-10-13 ENCOUNTER — Ambulatory Visit (HOSPITAL_COMMUNITY): Payer: PPO

## 2022-10-14 ENCOUNTER — Ambulatory Visit: Payer: PPO

## 2022-10-14 VITALS — BP 132/70 | HR 76 | Resp 14 | Ht 72.5 in | Wt 271.4 lb

## 2022-10-14 DIAGNOSIS — I10 Essential (primary) hypertension: Secondary | ICD-10-CM | POA: Diagnosis not present

## 2022-10-14 DIAGNOSIS — I4821 Permanent atrial fibrillation: Secondary | ICD-10-CM

## 2022-10-14 DIAGNOSIS — E7800 Pure hypercholesterolemia, unspecified: Secondary | ICD-10-CM

## 2022-10-14 NOTE — Progress Notes (Signed)
Primary Physician/Referring:  [name], MD  [name], male    DOB: 1946-11-09, 76 y.o.   MRN: [id number]  Chief Complaint  Patient presents with   Atrial Fibrillation   Hypertension   Follow-up    1 year   [name]  is a 76 y.o. male with chroninc atrial fibrillation anticoagulated on Xarelto. OSA compliant with CPAP, tobacco use disorder quit in Dec 2016 with 50 pack year history, hypertension, and hyperlipidemia.  Patient presents for annual follow-up. Overall, he is doing well and remains asymptomatic.  He recently had his gallbladder removed and has recovered well from surgery. He plans to start exercising including walking and joining a gym for weight lifting. Denies chest pain, palpitations, dyspnea, syncope, near syncope.  Denies orthopnea, PND, leg swelling.  Patient has maintained weight loss.  Past Medical History:  Diagnosis Date   Atrial fibrillation [facility])    Coronary artery calcification    Dyslipidemia    Erectile dysfunction    Hyperlipidemia    Hypertension    Obesity    OSA (obstructive sleep apnea)    Permanent atrial fibrillation ([place]) 04/25/2015   Cardioversion successful after 3 attempts on 03/20/2015; pt back in A. Fib on 03/28/2015. Anticoagulation long-term.   Sleep apnea    Phreesia 09/25/2020   Tobacco user 06/22/2013   Pt quit smoking October 2014.    Past Surgical History:  Procedure Laterality Date   BIOPSY  08/26/2022   Procedure: BIOPSY;  Surgeon: Gatha Mayer, MD;  Location: [facility];  Service: Gastroenterology;;   CARDIOVERSION N/A 03/20/2015   Procedure: CARDIOVERSION;  Surgeon: [name], MD;  Location: [facility] ENDOSCOPY;  Service: Cardiovascular;  Laterality: N/A;   CHOLECYSTECTOMY N/A 08/27/2022   Procedure: LAPAROSCOPIC CHOLECYSTECTOMY;  Surgeon: Jovita Kussmaul, MD;  Location: [facility] ORS;  Service: General;  Laterality: N/A;   COLONOSCOPY  2014   COLONOSCOPY  03/19/2021   ERCP N/A 08/26/2022    Procedure: ENDOSCOPIC RETROGRADE CHOLANGIOPANCREATOGRAPHY (ERCP);  Surgeon: Gatha Mayer, MD;  Location: [facility];  Service: Gastroenterology;  Laterality: N/A;   SPHINCTEROTOMY  08/26/2022   Procedure: SPHINCTEROTOMY;  Surgeon: Gatha Mayer, MD;  Location: [facility] ENDOSCOPY;  Service: Gastroenterology;;   TONSILLECTOMY     age 77   VASECTOMY     Family History  Problem Relation Age of Onset   Cancer Mother        kind unknown   Lung cancer Sister    Colon cancer Paternal Grandmother    Esophageal cancer Neg Hx    Inflammatory bowel disease Neg Hx    Liver disease Neg Hx    Colon polyps Neg Hx    Stomach cancer Neg Hx    Rectal cancer Neg Hx    Sleep apnea Neg Hx     Social History   Tobacco Use   Smoking status: Former    Packs/day: 1.00    Years: 50.00    Total pack years: 50.00    Types: Cigarettes    Quit date: 08/26/2013    Years since quitting: 9.1   Smokeless tobacco: Never   Tobacco comments:    0 cigarettes for 3 weeks  Substance Use Topics   Alcohol use: Yes    Alcohol/week: 1.0 standard drink of alcohol    Types: 1 Cans of beer per week    Comment: rare occasion, maybe once monthly   ROS  Review of Systems  Cardiovascular:  Negative  for chest pain, leg swelling, near-syncope and palpitations.  Respiratory:  Positive for snoring (on CPAP). Negative for shortness of breath.   Gastrointestinal:  Negative for melena.   Objective  Blood pressure 132/70, pulse 76, resp. rate 14, height 6' 0.5" (1.842 m), weight 271 lb 6.4 oz (123.1 kg), SpO2 97 %.     10/14/2022   10:12 AM 10/03/2022    8:32 AM 09/03/2022    1:32 PM  Vitals with BMI  Height 6' 0.5" 6' 0.5" '6\' 1"'$   Weight 271 lbs 6 oz 269 lbs 13 oz 269 lbs 10 oz  BMI 36.28 16.10 96.04  Systolic 540 981 191  Diastolic 70 72 70  Pulse 76 75 80     Physical Exam HENT:     Head: Atraumatic.  Cardiovascular:     Rate and Rhythm: Normal rate. Rhythm irregular.     Pulses:          Carotid pulses are  2+ on the right side and 2+ on the left side.      Femoral pulses are 2+ on the right side and 2+ on the left side.      Dorsalis pedis pulses are 2+ on the right side and 2+ on the left side.       Posterior tibial pulses are 1+ on the right side and 1+ on the left side.     Heart sounds: Normal heart sounds. No murmur heard.    No gallop. No S3 or S4 sounds.  Pulmonary:     Effort: Pulmonary effort is normal.     Breath sounds: Normal breath sounds.  Musculoskeletal:     Right lower leg: No edema.     Left lower leg: No edema.    Laboratory examination:   Recent Labs    08/26/22 0544 08/27/22 0512 08/28/22 0522 09/03/22 1445 10/03/22 0937  NA 132* 134* 134* 138 139  K 4.0 4.2 4.8 4.9 4.4  CL 102 105 105 102 103  CO2 21* '22 22 29 31  '$ GLUCOSE 122* 118* 148* 89 105*  BUN '14 19 20 11 9  '$ CREATININE 0.56* 0.76 0.67 0.71 0.61  CALCIUM 8.4* 8.3* 8.5* 9.6 9.3  GFRNONAA >60 >60 >60  --   --    estimated creatinine clearance is 110.6 mL/min (by C-G formula based on SCr of 0.61 mg/dL).     Latest Ref Rng & Units 10/03/2022    9:37 AM 09/03/2022    2:45 PM 08/28/2022    5:22 AM  CMP  Glucose 70 - 99 mg/dL 105  89  148   BUN 6 - 23 mg/dL '9  11  20   '$ Creatinine 0.40 - 1.50 mg/dL 0.61  0.71  0.67   Sodium 135 - 145 mEq/L 139  138  134   Potassium 3.5 - 5.1 mEq/L 4.4  4.9  4.8   Chloride 96 - 112 mEq/L 103  102  105   CO2 19 - 32 mEq/L '31  29  22   '$ Calcium 8.4 - 10.5 mg/dL 9.3  9.6  8.5   Total Protein 6.0 - 8.3 g/dL 6.6  6.9  6.1   Total Bilirubin 0.2 - 1.2 mg/dL 1.5  1.6  3.0   Alkaline Phos 39 - 117 U/L 79  112  110   AST 0 - 37 U/L 24  60  111   ALT 0 - 53 U/L 25  115  215       Latest Ref  Rng & Units 10/03/2022    9:37 AM 08/28/2022    5:22 AM 08/27/2022    7:38 PM  CBC  WBC 4.0 - 10.5 K/uL 6.7  8.0    Hemoglobin 13.0 - 17.0 g/dL 13.9  12.2  13.1   Hematocrit 39.0 - 52.0 % 41.2  37.4  40.0   Platelets 150.0 - 400.0 K/uL 193.0  132     Lipid Panel Recent Labs     04/01/22 0846 10/03/22 0937  CHOL 120 118  TRIG 154.0* 128.0  LDLCALC 54 55  VLDL 30.8 25.6  HDL 35.10* 37.40*  CHOLHDL 3 3    HEMOGLOBIN A1C Lab Results  Component Value Date   HGBA1C 5.3 08/25/2022   MPG 105.41 08/25/2022   TSH Recent Labs    08/25/22 1949  TSH 0.805    Allergies  No Known Allergies    Medications Prior to Visit:   Outpatient Medications Prior to Visit  Medication Sig Dispense Refill   atorvastatin (LIPITOR) 40 MG tablet Take 1 tablet (40 mg total) by mouth daily. 90 tablet 2   Multiple Vitamins-Minerals (CENTRUM SILVER 50+MEN) TABS Take 1 tablet by mouth daily with breakfast.     niacin (NIASPAN) 1000 MG CR tablet Take 1 tablet by mouth once daily as directed with a low fat snack and aspirin 30 minutes prior to taking medication 90 tablet 0   verapamil (VERELAN PM) 240 MG 24 hr capsule Take 1 capsule (240 mg total) by mouth at bedtime. 90 capsule 2   vitamin C (ASCORBIC ACID) 500 MG tablet Take 500 mg by mouth daily.     XARELTO 20 MG TABS tablet Take 1 tablet (20 mg total) by mouth daily. 90 tablet 0   ALEVE 220 MG tablet Take 220 mg by mouth 2 (two) times daily as needed (for pain).     No facility-administered medications prior to visit.     Final Medications at End of Visit    Current Meds  Medication Sig   atorvastatin (LIPITOR) 40 MG tablet Take 1 tablet (40 mg total) by mouth daily.   Multiple Vitamins-Minerals (CENTRUM SILVER 50+MEN) TABS Take 1 tablet by mouth daily with breakfast.   niacin (NIASPAN) 1000 MG CR tablet Take 1 tablet by mouth once daily as directed with a low fat snack and aspirin 30 minutes prior to taking medication   verapamil (VERELAN PM) 240 MG 24 hr capsule Take 1 capsule (240 mg total) by mouth at bedtime.   vitamin C (ASCORBIC ACID) 500 MG tablet Take 500 mg by mouth daily.   XARELTO 20 MG TABS tablet Take 1 tablet (20 mg total) by mouth daily.   Radiology:   CT Scan of Chest  [08/24/2013]: Normal lung  fields. Coronary artery calcification present primarily in the distribution of the LAD.  Cardiac Studies:   Sleep study 12/20/2014: Moderate to severe obstructive sleep apnea, successfully treated with CPAP. Follows Dr. Rexene Alberts.  Nuclear stress test  [11/20/2014]: 1. The resting electrocardiogram demonstrated atrial fibrillation and incomplete RBBB. The stress electrocardiogram was non-diagnostic due to pharmacological stress testing with Lexican. The stress test was terminated because of dyspnea and end of protocol. 2. SPECT images demonstrate homogeneous tracer distribution throughout the myocardium. The left ventricular ejection fraction was calculated or visually estimated to be 38%. The estimation of the EF could be an error due to A. fibrillation. Clinical correlation recommended, this is a low risk scan.  Echocardiogram 06/20/2020: Normal LV systolic function with visual EF 55-60%.  Left ventricle cavity is normal in size. Moderate left ventricular hypertrophy. Normal global wall motion. Unable to evaluate diastolic function due to atrial fibrillation. Elevated LAP. Calculated EF 59%. Left atrial cavity is severely dilated. Grossly right atrial size is moderately dilated. Mild (Grade I) aortic regurgitation. Aortic sclerosis without stenosis. Mild tricuspid regurgitation. Mild pulmonary hypertension. RVSP measures 38 mmHg. Insignificant pericardial effusion. There is no hemodynamic significance. The aortic root is dilated, sinus tubular junction 3.8cm IVC is dilated with a respiratory response of <50%. Compared to prior study dated 11/21/2014: Mild AR, Mild TR, mild PHTN, and aortic dilation are new findings.   EKG:   EKG 10/14/2022: Atrial fibrillation with controlled ventricular response at 68 bpm.  Normal axis.  Low voltage complexes.  Old anterior infarct.  Compared to previous EKG on 10/15/2021, no significant change.  Assessment     ICD-10-CM   1. Permanent atrial fibrillation (HCC)   I48.21 EKG 12-Lead    2. Primary hypertension  I10     3. Hypercholesteremia  E78.00        No orders of the defined types were placed in this encounter.   Medications Discontinued During This Encounter  Medication Reason   ALEVE 220 MG tablet      Recommendations:   Edward Watson  is a 76 y.o. male  male with chroninc atrial fibrillation anticoagulated on Xarelto. OSA compliant with CPAP, tobacco use disorder quit in Dec 2016 with 50 pack year history, hypertension, and hyperlipidemia.   Permanent atrial fibrillation ([place]) He remains in rate controlled A-fib. Tolerating Xarelto without bleeding diathesis. Kidney function within normal limits. No clinical evidence of heart failure on physical exam.  Primary hypertension Blood pressures well controlled. He does continue to monitor his blood pressure daily at home and readings are 120-130s/70s. He plans to start exercising daily and also plans to join a gym for some weightlifting.  He has been able unable to exercise due to his gallbladder surgery. He is following a low-sodium diet. He reports compliance with CPAP.  Hypercholesteremia Reviewed external labs, lipids under good control. Continues on atorvastatin 40 mg daily without myalgias.  Overall he is stable from a cardiovascular standpoint.  No changes to medications at this time.  Follow-up in 1 year or sooner if needed.   Ernst Spell, [place] Office: [phone] Pager: [phone]

## 2022-10-15 ENCOUNTER — Ambulatory Visit: Payer: PPO | Admitting: Student

## 2022-10-15 ENCOUNTER — Ambulatory Visit: Payer: PPO | Admitting: Internal Medicine

## 2022-10-20 ENCOUNTER — Ambulatory Visit (HOSPITAL_COMMUNITY)
Admission: RE | Admit: 2022-10-20 | Discharge: 2022-10-20 | Disposition: A | Discharge status: Home / Self Care | Payer: PPO | Source: Ambulatory Visit | Attending: Family Medicine | Admitting: Family Medicine

## 2022-10-20 DIAGNOSIS — I700 Atherosclerosis of aorta: Secondary | ICD-10-CM | POA: Insufficient documentation

## 2022-10-20 DIAGNOSIS — Z87891 Personal history of nicotine dependence: Secondary | ICD-10-CM | POA: Insufficient documentation

## 2022-10-20 DIAGNOSIS — Z122 Encounter for screening for malignant neoplasm of respiratory organs: Secondary | ICD-10-CM | POA: Insufficient documentation

## 2022-10-20 DIAGNOSIS — I2510 Atherosclerotic heart disease of native coronary artery without angina pectoris: Secondary | ICD-10-CM | POA: Diagnosis not present

## 2022-10-20 DIAGNOSIS — J439 Emphysema, unspecified: Secondary | ICD-10-CM | POA: Insufficient documentation

## 2022-10-21 NOTE — Progress Notes (Signed)
Thank you, he is on atorva and Xarelto. And has known aortic valve sclerosis. Will consider Echo next year

## 2022-10-23 DIAGNOSIS — G4733 Obstructive sleep apnea (adult) (pediatric): Secondary | ICD-10-CM | POA: Diagnosis not present

## 2022-10-31 ENCOUNTER — Ambulatory Visit (INDEPENDENT_AMBULATORY_CARE_PROVIDER_SITE_OTHER): Payer: PPO | Admitting: Gastroenterology

## 2022-10-31 ENCOUNTER — Encounter: Payer: Self-pay | Admitting: Gastroenterology

## 2022-10-31 VITALS — BP 130/80 | HR 84 | Ht 72.5 in | Wt 273.0 lb

## 2022-10-31 DIAGNOSIS — Z7901 Long term (current) use of anticoagulants: Secondary | ICD-10-CM | POA: Diagnosis not present

## 2022-10-31 DIAGNOSIS — Z86010 Personal history of colonic polyps: Secondary | ICD-10-CM

## 2022-10-31 DIAGNOSIS — Z9049 Acquired absence of other specified parts of digestive tract: Secondary | ICD-10-CM | POA: Diagnosis not present

## 2022-10-31 DIAGNOSIS — Z860101 Personal history of adenomatous and serrated colon polyps: Secondary | ICD-10-CM

## 2022-10-31 DIAGNOSIS — R194 Change in bowel habit: Secondary | ICD-10-CM

## 2022-10-31 DIAGNOSIS — Z9889 Other specified postprocedural states: Secondary | ICD-10-CM | POA: Diagnosis not present

## 2022-10-31 NOTE — Patient Instructions (Signed)
Recall colonoscopy will be placed for March 2024. You will be scheduled for April 2024. If you have not heard from our office by March 2024 ,please contact office to schedule.   _______________________________________________________  If you are age 74 or older, your body mass index should be between 23-30. Your Body mass index is 36.52 kg/m. If this is out of the aforementioned range listed, please consider follow up with your Primary Care Provider.  If you are age 87 or younger, your body mass index should be between 19-25. Your Body mass index is 36.52 kg/m. If this is out of the aformentioned range listed, please consider follow up with your Primary Care Provider.   ________________________________________________________  The [facility] providers would like to encourage you to use [facility] to communicate with providers for non-urgent requests or questions.  Due to long hold times on the telephone, sending your provider a message by [facility] may be a faster and more efficient way to get a response.  Please allow 48 business hours for a response.  Please remember that this is for non-urgent requests.  _______________________________________________________  Thank you for choosing me and [facility].  Dr. Rush Landmark
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
## 2022-11-01 ENCOUNTER — Encounter: Payer: Self-pay | Admitting: Gastroenterology

## 2022-11-01 DIAGNOSIS — Z7901 Long term (current) use of anticoagulants: Secondary | ICD-10-CM | POA: Insufficient documentation

## 2022-11-01 DIAGNOSIS — Z9049 Acquired absence of other specified parts of digestive tract: Secondary | ICD-10-CM | POA: Insufficient documentation

## 2022-11-01 DIAGNOSIS — Z9889 Other specified postprocedural states: Secondary | ICD-10-CM | POA: Insufficient documentation

## 2022-11-01 DIAGNOSIS — R194 Change in bowel habit: Secondary | ICD-10-CM | POA: Insufficient documentation

## 2022-11-01 DIAGNOSIS — Z86010 Personal history of colonic polyps: Secondary | ICD-10-CM | POA: Insufficient documentation

## 2023-01-26 ENCOUNTER — Other Ambulatory Visit: Payer: Self-pay

## 2023-01-27 ENCOUNTER — Other Ambulatory Visit: Payer: Self-pay

## 2023-01-27 MED ORDER — XARELTO 20 MG PO TABS: 20.0000 mg | ORAL_TABLET | Freq: Every day | ORAL | 1 refills | Status: DC

## 2023-02-24 ENCOUNTER — Telehealth: Payer: Self-pay | Admitting: Pharmacist

## 2023-02-24 DIAGNOSIS — E785 Hyperlipidemia, unspecified: Secondary | ICD-10-CM

## 2023-02-24 NOTE — Telephone Encounter (Signed)
This patient has been identified as "high risk" and in the top 25% of risk stratification for [facility] accountable patients.  These patients were identified using a number of factors including # of hospitalizations, ED visits, HF exacerbations, elevated BP and A1c, and overall cost of care.   Referral placed for cosign by the PCP.  [facility]CS team to schedule once cosigned.  Willa Frater, PharmD Clinical Pharmacist  [facility] [phone]

## 2023-03-02 ENCOUNTER — Telehealth: Payer: Self-pay | Admitting: Pharmacist

## 2023-03-02 NOTE — Progress Notes (Unsigned)
Care Management & Coordination Services Pharmacy Team   Reason for Encounter: Chart Prep for initial visit with CPP    Contacted patient to confirm telephone appointment with  Erskine Emery, PharmD on 03/05/23 at 9AM.   Do you have any problems getting your medications? No If yes what types of problems are you experiencing?  N/A  What is your top health concern you would like to discuss at your upcoming visit?  Patient didn't have any concerns discuss at this point.   Have you seen any other providers since your last visit with PCP? No   Chart review:  Recent office visits:  10/03/22 Meredith Staggers, MD - Family Medicine - Annual Exam - Labs were ordered. No medication changes. Follow up in 6 months.  09/03/22 Meredith Staggers, MD - Family Medicine - Cholelithiasis - Labs were ordered. Patient doing well post gallbladder removal. No medication changes. Follow up as scheduled.   Recent consult visits:  10/31/22 Corliss Parish MD - Gastroenterology - Adenomatous - No medication changes. Schedule colonoscopy as discussed.  10/14/22 - Nori Riis, NP - Cardiology - Afib - EKG ordered. No medication changes. Follow up in 1 year.   09/18/22 Saunders Glance MD - General Surgery - Post Cholecystectomy - No changes. Follow up as needed.    Hospital visits:  None in previous 6 months   Star Rating Drugs:  Medication:   Last Fill: Day Supply  atorvastatin 40 MG tablet  01/26/23  90  Care Gaps: Annual wellness visit in last year? Yes done 10/01/22  If Diabetic: Last eye exam / retinopathy screening: Last diabetic foot exam:   Future Appointments  Date Time Provider Department Center  03/05/2023  9:00 AM Erroll Luna, [facility] None  04/03/2023  8:20 AM Shade Flood, MD [facility]  05/26/2023  2:10 PM Mansouraty, Netty Starring., MD [facility]  10/13/2023 10:00 AM Custovic, Rozell Searing, DO [facility] None    [employer], [address]

## 2023-03-03 DIAGNOSIS — Z85828 Personal history of other malignant neoplasm of skin: Secondary | ICD-10-CM | POA: Diagnosis not present

## 2023-03-03 DIAGNOSIS — L570 Actinic keratosis: Secondary | ICD-10-CM | POA: Diagnosis not present

## 2023-03-03 DIAGNOSIS — S20461A Insect bite (nonvenomous) of right back wall of thorax, initial encounter: Secondary | ICD-10-CM | POA: Diagnosis not present

## 2023-03-03 DIAGNOSIS — L821 Other seborrheic keratosis: Secondary | ICD-10-CM | POA: Diagnosis not present

## 2023-03-03 DIAGNOSIS — D485 Neoplasm of uncertain behavior of skin: Secondary | ICD-10-CM | POA: Diagnosis not present

## 2023-03-03 DIAGNOSIS — C44629 Squamous cell carcinoma of skin of left upper limb, including shoulder: Secondary | ICD-10-CM | POA: Diagnosis not present

## 2023-03-03 DIAGNOSIS — C44622 Squamous cell carcinoma of skin of right upper limb, including shoulder: Secondary | ICD-10-CM | POA: Diagnosis not present

## 2023-03-04 ENCOUNTER — Encounter: Payer: PPO | Admitting: Pharmacist

## 2023-03-05 ENCOUNTER — Ambulatory Visit: Payer: PPO | Admitting: Pharmacist

## 2023-03-05 NOTE — Progress Notes (Signed)
Care Management & Coordination Services Pharmacy Note  03/10/2023 Name:  Edward Watson MRN:  [id number] DOB:  02-19-1946  Summary: PharmD initial visit.  All chronic conditions well controlled.  He has no concerns with meds at this time.    Recommendations/Changes made from today's visit: None  Follow up plan: FU as needed, patient was given direct contact #   Subjective: Edward Watson is an 76 y.o. year old male who is a primary patient of Neva Seat, Asencion Partridge, MD.  The care coordination team was consulted for assistance with disease management and care coordination needs.    Engaged with patient by telephone for initial visit.  Recent office visits:  10/03/22 Meredith Staggers, MD - Family Medicine - Annual Exam - Labs were ordered. No medication changes. Follow up in 6 months.   09/03/22 Meredith Staggers, MD - Family Medicine - Cholelithiasis - Labs were ordered. Patient doing well post gallbladder removal. No medication changes. Follow up as scheduled.    Recent consult visits:  10/31/22 Corliss Parish MD - Gastroenterology - Adenomatous - No medication changes. Schedule colonoscopy as discussed.   10/14/22 - Nori Riis, NP - Cardiology - Afib - EKG ordered. No medication changes. Follow up in 1 year.    09/18/22 Saunders Glance MD - General Surgery - Post Cholecystectomy - No changes. Follow up as needed.      Hospital visits:  None in previous 6 months   Objective:  Lab Results  Component Value Date   CREATININE 0.61 10/03/2022   BUN 9 10/03/2022   GFR 94.77 10/03/2022   GFRNONAA >60 08/28/2022   GFRAA 107 05/28/2020   NA 139 10/03/2022   K 4.4 10/03/2022   CALCIUM 9.3 10/03/2022   CO2 31 10/03/2022   GLUCOSE 105 (H) 10/03/2022    Lab Results  Component Value Date/Time   HGBA1C 5.3 08/25/2022 07:49 PM   HGBA1C 5.6 04/01/2022 08:46 AM   GFR 94.77 10/03/2022 09:37 AM   GFR 90.58 09/03/2022 02:45 PM    Last diabetic Eye exam: No results found  for: "HMDIABEYEEXA"  Last diabetic Foot exam: No results found for: "HMDIABFOOTEX"   Lab Results  Component Value Date   CHOL 118 10/03/2022   HDL 37.40 (L) 10/03/2022   LDLCALC 55 10/03/2022   TRIG 128.0 10/03/2022   CHOLHDL 3 10/03/2022       Latest Ref Rng & Units 10/03/2022    9:37 AM 09/03/2022    2:45 PM 08/28/2022    5:22 AM  Hepatic Function  Total Protein 6.0 - 8.3 g/dL 6.6  6.9  6.1   Albumin 3.5 - 5.2 g/dL 4.4  3.9  3.1   AST 0 - 37 U/L 24  60  111   ALT 0 - 53 U/L 25  115  215   Alk Phosphatase 39 - 117 U/L 79  112  110   Total Bilirubin 0.2 - 1.2 mg/dL 1.5  1.6  3.0     Lab Results  Component Value Date/Time   TSH 0.805 08/25/2022 07:49 PM   TSH 2.910 02/10/2020 08:06 AM   TSH 1.618 10/27/2014 11:14 AM       Latest Ref Rng & Units 10/03/2022    9:37 AM 08/28/2022    5:22 AM 08/27/2022    7:38 PM  CBC  WBC 4.0 - 10.5 K/uL 6.7  8.0    Hemoglobin 13.0 - 17.0 g/dL 08.6  57.8  46.9   Hematocrit 39.0 - 52.0 % 41.2  37.4  40.0   Platelets 150.0 - 400.0 K/uL 193.0  132      No results found for: "VD25OH", "VITAMINB12"  Clinical ASCVD: Yes  The ASCVD Risk score (Arnett DK, et al., 2019) failed to calculate for the following reasons:   The valid total cholesterol range is 130 to 320 mg/dL        09/81/1914    7:82 AM 10/01/2022    9:20 AM 09/03/2022    1:31 PM  Depression screen PHQ 2/9  Decreased Interest 0 0 0  Down, Depressed, Hopeless 0 0 0  PHQ - 2 Score 0 0 0  Altered sleeping 0 0 0  Tired, decreased energy 0 0 0  Change in appetite 0 0 0  Feeling bad or failure about yourself  0 0 0  Trouble concentrating 0 0 0  Moving slowly or fidgety/restless 0 0 0  Suicidal thoughts 0 0 0  PHQ-9 Score 0 0 0     Social History   Tobacco Use  Smoking Status Former   Packs/day: 1.00   Years: 50.00   Additional pack years: 0.00   Total pack years: 50.00   Types: Cigarettes   Quit date: 08/26/2013   Years since quitting: 9.5  Smokeless Tobacco  Never  Tobacco Comments   0 cigarettes for 3 weeks   BP Readings from Last 3 Encounters:  10/31/22 130/80  10/14/22 132/70  10/03/22 128/72   Pulse Readings from Last 3 Encounters:  10/31/22 84  10/14/22 76  10/03/22 75   Wt Readings from Last 3 Encounters:  10/31/22 273 lb (123.8 kg)  10/14/22 271 lb 6.4 oz (123.1 kg)  10/03/22 269 lb 12.8 oz (122.4 kg)   BMI Readings from Last 3 Encounters:  10/31/22 36.52 kg/m  10/14/22 36.30 kg/m  10/03/22 36.09 kg/m    No Known Allergies  Medications Reviewed Today     Reviewed by Erroll Luna, RPH (Pharmacist) on 03/10/23 at 1151  Med List Status: <None>   Medication Order Taking? Sig Documenting Provider Last Dose Status Informant  atorvastatin (LIPITOR) 40 MG tablet [id number] Yes Take 1 tablet (40 mg total) by mouth daily. Shade Flood, MD Taking Active   Multiple Vitamins-Minerals (CENTRUM SILVER 50+MEN) TABS [id number] Yes Take 1 tablet by mouth daily with breakfast. [provider] Taking Active Self  niacin (NIASPAN) 1000 MG CR tablet [id number] Yes Take 1 tablet by mouth once daily as directed with a low fat snack and aspirin 30 minutes prior to taking medication Shade Flood, MD Taking Active   verapamil (VERELAN PM) 240 MG 24 hr capsule [id number] Yes Take 1 capsule (240 mg total) by mouth at bedtime. Shade Flood, MD Taking Active   vitamin C (ASCORBIC ACID) 500 MG tablet [id number] Yes Take 500 mg by mouth daily. [provider] Taking Active Self  XARELTO 20 MG TABS tablet [id number]  Take 1 tablet (20 mg total) by mouth daily. Yates Decamp, MD  Active             SDOH:  (Social Determinants of Health) assessments and interventions performed: Yes Financial Resource Strain: Low Risk  (03/10/2023)   Overall Financial Resource Strain (CARDIA)    Difficulty of Paying Living Expenses: Not hard at all   Food Insecurity: No Food Insecurity (03/10/2023)   Hunger Vital Sign    Worried About  Running Out of Food in the Last Year: Never true    Ran Out of Food in the Last  Year: Never true    SDOH Interventions    Flowsheet Row Clinical Support from 10/01/2022 in [facility] Telephone from 09/03/2022 in [facility] [employer] [facility] Care Coordination  SDOH Interventions    Food Insecurity Interventions Intervention Not Indicated Intervention Not Indicated  Housing Interventions Intervention Not Indicated --  Transportation Interventions Intervention Not Indicated Intervention Not Indicated  Utilities Interventions Intervention Not Indicated --  Alcohol Usage Interventions Intervention Not Indicated (Score <7) --  Financial Strain Interventions Intervention Not Indicated --  Physical Activity Interventions Intervention Not Indicated --  Stress Interventions Intervention Not Indicated --  Social Connections Interventions Intervention Not Indicated --       Medication Assistance: None required.  Patient affirms current coverage meets needs.  Medication Access: Within the past 30 days, how often has patient missed a dose of medication? 0 Is a pillbox or other method used to improve adherence? Yes  Factors that may affect medication adherence? no barriers identified Are meds synced by current pharmacy? No  Are meds delivered by current pharmacy? Yes  Does patient experience delays in picking up medications due to transportation concerns? No   [facility] Services Reviewed: Is patient disadvantaged to use [facility]?: Yes  Current Rx insurance plan: HTA Name and location of Current pharmacy:  [id number] [place], [place] - [address] AT [facility] OF [facility] [address] [place] [address] Phone: [phone] Fax: [phone]  [facility] [place], [place] - [address] [place] [address] [address] Phone: [phone] Fax:  [phone]  [facility] services reviewed with patient today?: Yes  Patient requests to transfer care to [facility]?: No  Reason patient declined to change pharmacies: Disadvantaged due to insurance/mail order  Compliance/Adherence/Medication fill history: Star Rating Drugs:  Medication:                            Last Fill:         Day Supply   atorvastatin 40 MG tablet       01/26/23                        90   Care Gaps: Annual wellness visit in last year? Yes done 10/01/22   Assessment/Plan   Hypertension (BP goal <130/80) -Controlled -Current treatment: Verapamil  24hr Appropriate, Effective, Safe, Accessible -Medications previously tried: none noted  -Current home readings: 120s/80-85  -Denies hypotensive/hypertensive symptoms -Educated on BP goals and benefits of medications for prevention of heart attack, stroke and kidney damage; Daily salt intake goal < 2300 mg; -Counseled to monitor BP at home weekly, document, and provide log at future appointments -Recommended to continue current medication BP well controlled, patient has no concerns with meds at this time.  Continue current regimen.  Hyperlipidemia: (LDL goal < 70) -Controlled. Last LDL was 55 -Current treatment: Atorvastatin  Appropriate, Effective, Safe, Accessible -Medications previously tried: none noted  -Tolerating statin well, no concerns with adherence -Educated on Cholesterol goals;  Benefits of statin for ASCVD risk reduction; Importance of limiting foods high in cholesterol; -Recommended to continue current medication   Atrial Fibrillation (Goal: prevent stroke and major bleeding) -Controlled -CHADSVASC: 3 -Current treatment: Anticoagulation: Xarelto  Appropriate, Effective, Safe, Accessible -Medications previously tried: none noted -Home BP and HR readings: controlled, see HTN  -Counseled on increased risk  of stroke due to Afib and benefits of anticoagulation for  stroke prevention; importance of adherence to anticoagulant exactly as prescribed; -Recommended to continue current medication Xarelto copay affordable at this time, counseled to let me know if he hits the donut hole and we need to apply for any programs.  Willa Frater, PharmD Clinical Pharmacist  [facility] [phone]

## 2023-03-20 ENCOUNTER — Telehealth: Payer: Self-pay

## 2023-03-20 DIAGNOSIS — E785 Hyperlipidemia, unspecified: Secondary | ICD-10-CM

## 2023-03-20 MED ORDER — NIACIN ER (ANTIHYPERLIPIDEMIC) 1000 MG PO TBCR: EXTENDED_RELEASE_TABLET | ORAL | 1 refills | Status: DC

## 2023-03-20 NOTE — Telephone Encounter (Signed)
Birdi sent over electric request for refill for Niacin ER 1000 MG Tab. Stated patient just received last refill and wanted to obtain new prescription on file or next refill.   Patient is requesting a refill of the following medications: Requested Prescriptions    No prescriptions requested or ordered in this encounter    Date of patient request: 03/19/2023  Last office visit: 10/03/2022 Date of last refill: 10/08/2022 Last refill amount: 90 Follow up time period per chart: 6months

## 2023-03-20 NOTE — Telephone Encounter (Signed)
Niaspan refilled. 

## 2023-04-03 ENCOUNTER — Ambulatory Visit (INDEPENDENT_AMBULATORY_CARE_PROVIDER_SITE_OTHER): Payer: PPO | Admitting: Family Medicine

## 2023-04-03 ENCOUNTER — Encounter: Payer: Self-pay | Admitting: Family Medicine

## 2023-04-03 ENCOUNTER — Other Ambulatory Visit: Payer: Self-pay

## 2023-04-03 VITALS — BP 110/70 | HR 68 | Temp 98.0°F | Wt 268.2 lb

## 2023-04-03 DIAGNOSIS — Z7901 Long term (current) use of anticoagulants: Secondary | ICD-10-CM

## 2023-04-03 DIAGNOSIS — E785 Hyperlipidemia, unspecified: Secondary | ICD-10-CM

## 2023-04-03 DIAGNOSIS — I4891 Unspecified atrial fibrillation: Secondary | ICD-10-CM | POA: Diagnosis not present

## 2023-04-03 LAB — COMPREHENSIVE METABOLIC PANEL
ALT: 22 U/L (ref 0–53)
AST: 22 U/L (ref 0–37)
Albumin: 4.3 g/dL (ref 3.5–5.2)
Alkaline Phosphatase: 64 U/L (ref 39–117)
BUN: 11 mg/dL (ref 6–23)
CO2: 31 mEq/L (ref 19–32)
Calcium: 9.6 mg/dL (ref 8.4–10.5)
Chloride: 102 mEq/L (ref 96–112)
Creatinine, Ser: 0.71 mg/dL (ref 0.40–1.50)
GFR: 90.21 mL/min (ref >60.00)
Glucose, Bld: 109 mg/dL — ABNORMAL HIGH (ref 70–99)
Potassium: 5 mEq/L (ref 3.5–5.1)
Sodium: 138 mEq/L (ref 135–145)
Total Bilirubin: 1 mg/dL (ref 0.2–1.2)
Total Protein: 6.7 g/dL (ref 6.0–8.3)

## 2023-04-03 LAB — CBC
HCT: 41.2 % (ref 39.0–52.0)
Hemoglobin: 13.9 g/dL (ref 13.0–17.0)
MCHC: 33.6 g/dL (ref 30.0–36.0)
MCV: 91.6 fl (ref 78.0–100.0)
Platelets: 188 10*3/uL (ref 150.0–400.0)
RBC: 4.5 Mil/uL (ref 4.22–5.81)
RDW: 13.9 % (ref 11.5–15.5)
WBC: 6.6 10*3/uL (ref 4.0–10.5)

## 2023-04-03 LAB — LIPID PANEL
Cholesterol: 97 mg/dL (ref 0–200)
HDL: 35.2 mg/dL — ABNORMAL LOW (ref >39.00)
LDL Cholesterol: 46 mg/dL (ref 0–99)
NonHDL: 61.97
Total CHOL/HDL Ratio: 3
Triglycerides: 81 mg/dL (ref 0.0–149.0)
VLDL: 16.2 mg/dL (ref 0.0–40.0)

## 2023-04-03 MED ORDER — VERAPAMIL HCL ER 240 MG PO CP24: 240.0000 mg | ORAL_CAPSULE | Freq: Every day | ORAL | 2 refills | Status: DC

## 2023-04-03 MED ORDER — ATORVASTATIN CALCIUM 40 MG PO TABS: 40.0000 mg | ORAL_TABLET | Freq: Every day | ORAL | 2 refills | Status: DC

## 2023-04-03 MED ORDER — NIACIN ER (ANTIHYPERLIPIDEMIC) 1000 MG PO TBCR: EXTENDED_RELEASE_TABLET | ORAL | 1 refills | Status: DC
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
## 2023-04-03 NOTE — Patient Instructions (Signed)
Keep up the good work with watching diet and stay active.  No med changes for now.  Follow-up with me in 6 months but please let me know if there are any questions in the meantime.  Take care.

## 2023-04-21 ENCOUNTER — Telehealth: Payer: Self-pay

## 2023-04-21 ENCOUNTER — Other Ambulatory Visit: Payer: Self-pay

## 2023-04-21 DIAGNOSIS — E876 Hypokalemia: Secondary | ICD-10-CM

## 2023-04-21 DIAGNOSIS — E785 Hyperlipidemia, unspecified: Secondary | ICD-10-CM

## 2023-04-21 MED ORDER — NIACIN ER (ANTIHYPERLIPIDEMIC) 1000 MG PO TBCR: EXTENDED_RELEASE_TABLET | ORAL | 1 refills | Status: DC

## 2023-04-21 NOTE — Telephone Encounter (Signed)
Patient needs Niacin sent to [facility] pharmacy for mail order, notes that he called to get delivered and they do not have a prescription

## 2023-04-21 NOTE — Telephone Encounter (Signed)
Refilled Niacin 1000 Mg  90 days 1 refill

## 2023-05-26 ENCOUNTER — Ambulatory Visit: Payer: PPO | Admitting: Gastroenterology

## 2023-05-26 ENCOUNTER — Encounter: Payer: Self-pay | Admitting: Gastroenterology

## 2023-05-26 VITALS — BP 120/72 | HR 72 | Ht 71.5 in | Wt 274.0 lb

## 2023-05-26 DIAGNOSIS — Z7901 Long term (current) use of anticoagulants: Secondary | ICD-10-CM | POA: Diagnosis not present

## 2023-05-26 DIAGNOSIS — Z86010 Personal history of colonic polyps: Secondary | ICD-10-CM

## 2023-05-26 MED ORDER — NA SULFATE-K SULFATE-MG SULF 17.5-3.13-1.6 GM/177ML PO SOLN: 1.0000 | Freq: Once | ORAL | 0 refills | Status: AC
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
## 2023-05-26 NOTE — Patient Instructions (Signed)
You have been scheduled for a colonoscopy. Please follow written instructions given to you at your visit today.   Please pick up your prep supplies at the pharmacy within the next 1-3 days.  If you use inhalers (even only as needed), please bring them with you on the day of your procedure.  DO NOT TAKE 7 DAYS PRIOR TO TEST- Trulicity (dulaglutide) Ozempic, Wegovy (semaglutide) Mounjaro (tirzepatide) Bydureon Bcise (exanatide extended release)  DO NOT TAKE 1 DAY PRIOR TO YOUR TEST Rybelsus (semaglutide) Adlyxin (lixisenatide) Victoza (liraglutide) Byetta (exanatide) ___________________________________________________________________________   Bonita Quin will be contacted by our office prior to your procedure for directions on holding your Xarelto.  If you do not hear from our office 1 week prior to your scheduled procedure, please call [phone] to discuss.    Due to recent changes in healthcare laws, you may see the results of your imaging and laboratory studies on MyChart before your provider has had a chance to review them.  We understand that in some cases there may be results that are confusing or concerning to you. Not all laboratory results come back in the same time frame and the provider may be waiting for multiple results in order to interpret others.  Please give [place] 48 hours in order for your provider to thoroughly review all the results before contacting the office for clarification of your results.    _______________________________________________________  If your blood pressure at your visit was 140/90 or greater, please contact your primary care physician to follow up on this.  _______________________________________________________  If you are age 54 or older, your body mass index should be between 23-30. Your Body mass index is 37.68 kg/m. If this is out of the aforementioned range listed, please consider follow up with your Primary Care Provider.  If you are age 66 or  younger, your body mass index should be between 19-25. Your Body mass index is 37.68 kg/m. If this is out of the aformentioned range listed, please consider follow up with your Primary Care Provider.   ________________________________________________________  The [facility] providers would like to encourage you to use [facility] to communicate with providers for non-urgent requests or questions.  Due to long hold times on the telephone, sending your provider a message by [facility] may be a faster and more efficient way to get a response.  Please allow 48 business hours for a response.  Please remember that this is for non-urgent requests.  _______________________________________________________   I appreciate the  opportunity to care for you  Thank You   Vicente Serene Mansiuraty Jr,MD

## 2023-05-27 ENCOUNTER — Telehealth: Payer: Self-pay

## 2023-05-27 NOTE — Telephone Encounter (Signed)
Please hold Eliquis for 48 hours and proceed with low risk from cardiac standpoint. Restart Eliquis same day if no biopsy, otherwise in 3-5 days

## 2023-05-27 NOTE — Telephone Encounter (Signed)
Request for surgical clearance:     Endoscopy Procedure  What type of surgery is being performed?     Colonoscopy   When is this surgery scheduled?     07/21/2023  What type of clearance is required ?   Pharmacy  Are there any medications that need to be held prior to surgery and how long? Xarelto x2 days prior to procedure  Practice name and name of physician performing surgery?      [facility]  What is your office phone and fax number?      Phone- [phone]  Fax- [phone]  Anesthesia type (None, local, MAC, general) ?       MAC

## 2023-05-27 NOTE — Telephone Encounter (Signed)
Patient informed okay to hold Xarelto x2 days prior to procedure. Patient voiced understanding.

## 2023-05-27 NOTE — Telephone Encounter (Signed)
Meant Xarelto. Thank you

## 2023-05-29 ENCOUNTER — Encounter: Payer: Self-pay | Admitting: Gastroenterology

## 2023-06-17 ENCOUNTER — Ambulatory Visit (INDEPENDENT_AMBULATORY_CARE_PROVIDER_SITE_OTHER): Payer: PPO | Admitting: *Deleted

## 2023-06-17 DIAGNOSIS — Z0000 Encounter for general adult medical examination without abnormal findings: Secondary | ICD-10-CM | POA: Diagnosis not present

## 2023-06-17 NOTE — Patient Instructions (Signed)
[name] , Thank you for taking time to come for your Medicare Wellness Visit. I appreciate your ongoing commitment to your health goals. Please review the following plan we discussed and let me know if I can assist you in the future.   Screening recommendations/referrals: Colonoscopy: scheduled  Recommended yearly ophthalmology/optometry visit for glaucoma screening and checkup Recommended yearly dental visit for hygiene and checkup  Vaccinations: Influenza vaccine: up to date Pneumococcal vaccine: up to date Tdap vaccine: Education provided Shingles vaccine: up to date    Advanced directives: yes not on file  Preventive Care 65 Years and Older, Male Preventive care refers to lifestyle choices and visits with your health care provider that can promote health and wellness. What does preventive care include? A yearly physical exam. This is also called an annual well check. Dental exams once or twice a year. Routine eye exams. Ask your health care provider how often you should have your eyes checked. Personal lifestyle choices, including: Daily care of your teeth and gums. Regular physical activity. Eating a healthy diet. Avoiding tobacco and drug use. Limiting alcohol use. Practicing safe sex. Taking low doses of aspirin every day. Taking vitamin and mineral supplements as recommended by your health care provider. What happens during an annual well check? The services and screenings done by your health care provider during your annual well check will depend on your age, overall health, lifestyle risk factors, and family history of disease. Counseling  Your health care provider may ask you questions about your: Alcohol use. Tobacco use. Drug use. Emotional well-being. Home and relationship well-being. Sexual activity. Eating habits. History of falls. Memory and ability to understand (cognition). Work and work Astronomer. Screening  You may have the following tests or  measurements: Height, weight, and BMI. Blood pressure. Lipid and cholesterol levels. These may be checked every 5 years, or more frequently if you are over 67 years old. Skin check. Lung cancer screening. You may have this screening every year starting at age 23 if you have a 30-pack-year history of smoking and currently smoke or have quit within the past 15 years. Fecal occult blood test (FOBT) of the stool. You may have this test every year starting at age 62. Flexible sigmoidoscopy or colonoscopy. You may have a sigmoidoscopy every 5 years or a colonoscopy every 10 years starting at age 92. Prostate cancer screening. Recommendations will vary depending on your family history and other risks. Hepatitis C blood test. Hepatitis B blood test. Sexually transmitted disease (STD) testing. Diabetes screening. This is done by checking your blood sugar (glucose) after you have not eaten for a while (fasting). You may have this done every 1-3 years. Abdominal aortic aneurysm (AAA) screening. You may need this if you are a current or former smoker. Osteoporosis. You may be screened starting at age 14 if you are at high risk. Talk with your health care provider about your test results, treatment options, and if necessary, the need for more tests. Vaccines  Your health care provider may recommend certain vaccines, such as: Influenza vaccine. This is recommended every year. Tetanus, diphtheria, and acellular pertussis (Tdap, Td) vaccine. You may need a Td booster every 10 years. Zoster vaccine. You may need this after age 81. Pneumococcal 13-valent conjugate (PCV13) vaccine. One dose is recommended after age 72. Pneumococcal polysaccharide (PPSV23) vaccine. One dose is recommended after age 57. Talk to your health care provider about which screenings and vaccines you need and how often you need them. This information  is not intended to replace advice given to you by your health care provider. Make sure  you discuss any questions you have with your health care provider. Document Released: 11/30/2015 Document Revised: 07/23/2016 Document Reviewed: 09/04/2015 Elsevier Interactive Patient Education  2017 [employer].  Fall Prevention in the Home Falls can cause injuries. They can happen to people of all ages. There are many things you can do to make your home safe and to help prevent falls. What can I do on the outside of my home? Regularly fix the edges of walkways and driveways and fix any cracks. Remove anything that might make you trip as you walk through a door, such as a raised step or threshold. Trim any bushes or trees on the path to your home. Use bright outdoor lighting. Clear any walking paths of anything that might make someone trip, such as rocks or tools. Regularly check to see if handrails are loose or broken. Make sure that both sides of any steps have handrails. Any raised decks and porches should have guardrails on the edges. Have any leaves, snow, or ice cleared regularly. Use sand or salt on walking paths during winter. Clean up any spills in your garage right away. This includes oil or grease spills. What can I do in the bathroom? Use night lights. Install grab bars by the toilet and in the tub and shower. Do not use towel bars as grab bars. Use non-skid mats or decals in the tub or shower. If you need to sit down in the shower, use a plastic, non-slip stool. Keep the floor dry. Clean up any water that spills on the floor as soon as it happens. Remove soap buildup in the tub or shower regularly. Attach bath mats securely with double-sided non-slip rug tape. Do not have throw rugs and other things on the floor that can make you trip. What can I do in the bedroom? Use night lights. Make sure that you have a light by your bed that is easy to reach. Do not use any sheets or blankets that are too big for your bed. They should not hang down onto the floor. Have a firm  chair that has side arms. You can use this for support while you get dressed. Do not have throw rugs and other things on the floor that can make you trip. What can I do in the kitchen? Clean up any spills right away. Avoid walking on wet floors. Keep items that you use a lot in easy-to-reach places. If you need to reach something above you, use a strong step stool that has a grab bar. Keep electrical cords out of the way. Do not use floor polish or wax that makes floors slippery. If you must use wax, use non-skid floor wax. Do not have throw rugs and other things on the floor that can make you trip. What can I do with my stairs? Do not leave any items on the stairs. Make sure that there are handrails on both sides of the stairs and use them. Fix handrails that are broken or loose. Make sure that handrails are as long as the stairways. Check any carpeting to make sure that it is firmly attached to the stairs. Fix any carpet that is loose or worn. Avoid having throw rugs at the top or bottom of the stairs. If you do have throw rugs, attach them to the floor with carpet tape. Make sure that you have a light switch at the top of  the stairs and the bottom of the stairs. If you do not have them, ask someone to add them for you. What else can I do to help prevent falls? Wear shoes that: Do not have high heels. Have rubber bottoms. Are comfortable and fit you well. Are closed at the toe. Do not wear sandals. If you use a stepladder: Make sure that it is fully opened. Do not climb a closed stepladder. Make sure that both sides of the stepladder are locked into place. Ask someone to hold it for you, if possible. Clearly mark and make sure that you can see: Any grab bars or handrails. First and last steps. Where the edge of each step is. Use tools that help you move around (mobility aids) if they are needed. These include: Canes. Walkers. Scooters. Crutches. Turn on the lights when you go  into a dark area. Replace any light bulbs as soon as they burn out. Set up your furniture so you have a clear path. Avoid moving your furniture around. If any of your floors are uneven, fix them. If there are any pets around you, be aware of where they are. Review your medicines with your doctor. Some medicines can make you feel dizzy. This can increase your chance of falling. Ask your doctor what other things that you can do to help prevent falls. This information is not intended to replace advice given to you by your health care provider. Make sure you discuss any questions you have with your health care provider. Document Released: 08/30/2009 Document Revised: 04/10/2016 Document Reviewed: 12/08/2014 Elsevier Interactive Patient Education  2017 [employer].
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
## 2023-07-09 ENCOUNTER — Encounter: Payer: Self-pay | Admitting: Gastroenterology

## 2023-07-21 ENCOUNTER — Ambulatory Visit (AMBULATORY_SURGERY_CENTER): Payer: PPO | Admitting: Gastroenterology

## 2023-07-21 ENCOUNTER — Encounter: Payer: Self-pay | Admitting: Gastroenterology

## 2023-07-21 VITALS — BP 117/67 | HR 85 | Temp 98.0°F | Resp 17 | Ht 71.0 in | Wt 274.0 lb

## 2023-07-21 DIAGNOSIS — Z09 Encounter for follow-up examination after completed treatment for conditions other than malignant neoplasm: Secondary | ICD-10-CM

## 2023-07-21 DIAGNOSIS — Z86010 Personal history of colonic polyps: Secondary | ICD-10-CM | POA: Diagnosis not present

## 2023-07-21 DIAGNOSIS — D127 Benign neoplasm of rectosigmoid junction: Secondary | ICD-10-CM

## 2023-07-21 DIAGNOSIS — D120 Benign neoplasm of cecum: Secondary | ICD-10-CM | POA: Diagnosis not present

## 2023-07-21 DIAGNOSIS — D122 Benign neoplasm of ascending colon: Secondary | ICD-10-CM | POA: Diagnosis not present

## 2023-07-21 DIAGNOSIS — I4891 Unspecified atrial fibrillation: Secondary | ICD-10-CM | POA: Diagnosis not present

## 2023-07-21 DIAGNOSIS — D123 Benign neoplasm of transverse colon: Secondary | ICD-10-CM | POA: Diagnosis not present

## 2023-07-21 DIAGNOSIS — G4733 Obstructive sleep apnea (adult) (pediatric): Secondary | ICD-10-CM | POA: Diagnosis not present

## 2023-07-21 DIAGNOSIS — I10 Essential (primary) hypertension: Secondary | ICD-10-CM | POA: Diagnosis not present

## 2023-07-21 DIAGNOSIS — K635 Polyp of colon: Secondary | ICD-10-CM

## 2023-07-21 DIAGNOSIS — D124 Benign neoplasm of descending colon: Secondary | ICD-10-CM | POA: Diagnosis not present

## 2023-07-21 MED ORDER — SODIUM CHLORIDE 0.9 % IV SOLN: 500.0000 mL | Freq: Once | INTRAVENOUS | Status: DC

## 2023-07-21 NOTE — Progress Notes (Signed)
Called to room to assist during endoscopic procedure.  Patient ID and intended procedure confirmed with present staff. Received instructions for my participation in the procedure from the performing physician.  

## 2023-07-21 NOTE — Op Note (Signed)
[facility] Patient Name: Edward Watson Procedure Date: 07/21/2023 9:04 AM MRN: [id number] Endoscopist: Corliss Parish , MD, [id number] Age: 77 Referring MD:  Date of Birth: 06-09-1946 Gender: Male Account #: [id number] Procedure:                Colonoscopy Indications:              Surveillance: History of numerous (> 10) adenomas                            on last colonoscopy (< 3 yrs) Medicines:                Monitored Anesthesia Care Procedure:                Pre-Anesthesia Assessment:                           - Prior to the procedure, a History and Physical                            was performed, and patient medications and                            allergies were reviewed. The patient's tolerance of                            previous anesthesia was also reviewed. The risks                            and benefits of the procedure and the sedation                            options and risks were discussed with the patient.                            All questions were answered, and informed consent                            was obtained. Prior Anticoagulants: The patient has                            taken Xarelto (rivaroxaban), last dose was 2 days                            prior to procedure. ASA Grade Assessment: III - A                            patient with severe systemic disease. After                            reviewing the risks and benefits, the patient was                            deemed in satisfactory condition to undergo the  procedure.                           After obtaining informed consent, the colonoscope                            was passed under direct vision. Throughout the                            procedure, the patient's blood pressure, pulse, and                            oxygen saturations were monitored continuously. The                            Olympus CF-HQ190L ([id number]) Colonoscope was                             introduced through the anus and advanced to the the                            cecum, identified by appendiceal orifice and                            ileocecal valve. The colonoscopy was somewhat                            difficult due to a redundant colon. Successful                            completion of the procedure was aided by changing                            the patient's position, using manual pressure,                            straightening and shortening the scope to obtain                            bowel loop reduction and using scope torsion. The                            patient tolerated the procedure. The quality of the                            bowel preparation was adequate. The ileocecal                            valve, appendiceal orifice, and rectum were                            photographed. Scope In: 9:08:50 AM Scope Out: 9:33:06 AM Scope Withdrawal Time: 0 hours 19 minutes 44 seconds  Total Procedure Duration: 0 hours 24 minutes 16 seconds  Findings:  The digital rectal exam findings include                            hemorrhoids. Pertinent negatives include no                            palpable rectal lesions.                           Extensive amounts of semi-liquid semi-solid stool                            was found in the entire colon, making visualization                            difficult. Lavage of the area was performed using                            copious amounts, resulting in clearance with                            adequate visualization.                           The colon (entire examined portion) revealed                            significantly excessive looping.                           Many medium-mouthed and small-mouthed diverticula                            were found in the entire colon.                           Seven sessile polyps were found in the                             recto-sigmoid colon (2), descending colon (1),                            transverse colon (1), hepatic flexure (1),                            ascending colon (1) and cecum (1). The polyps were                            2 to 6 mm in size. These polyps were removed with a                            cold snare. Resection and retrieval were complete.                           Non-bleeding non-thrombosed internal hemorrhoids  were found during retroflexion, during perianal                            exam and during digital exam. The hemorrhoids were                            Grade II (internal hemorrhoids that prolapse but                            reduce spontaneously). Complications:            No immediate complications. Estimated Blood Loss:     Estimated blood loss was minimal. Impression:               - Hemorrhoids found on digital rectal exam.                           - Stool in the entire examined colon - lavaged with                            adequate visualization.                           - There was significant looping of the colon.                           - Diverticulosis in the entire examined colon.                           - Seven, 2 to 6 mm polyps at the recto-sigmoid                            colon, in the descending colon, in the transverse                            colon, at the hepatic flexure, in the ascending                            colon and in the cecum, removed with a cold snare.                            Resected and retrieved.                           - Non-bleeding non-thrombosed internal hemorrhoids. Recommendation:           - The patient will be observed post-procedure,                            until all discharge criteria are met.                           - Discharge patient to home.                           - Patient has  a contact number available for                            emergencies. The signs and symptoms  of potential                            delayed complications were discussed with the                            patient. Return to normal activities tomorrow.                            Written discharge instructions were provided to the                            patient.                           - High fiber diet.                           - Use FiberCon 1-2 tablets PO daily.                           - May restart Xarelto on 07/23/2023 (to decrease risk                            of post interventional bleeding).                           - Continue present medications.                           - Await pathology results.                           - Repeat colonoscopy in 3 years for surveillance                            (pending patient's health and medical issues at the                            time as he will be 77 years of age).                           - The findings and recommendations were discussed                            with the patient.                           - The findings and recommendations were discussed                            with the designated responsible adult. Corliss Parish, MD 07/21/2023 9:41:47 AM

## 2023-07-21 NOTE — Patient Instructions (Signed)
May restart you Xarelto as normal on Thursday 07/23/23 Recommend high fiber diet, use fibercon 1-2 tablets daily to keep bowel movement regular. Drink plenty of water/fluids.   YOU HAD AN ENDOSCOPIC PROCEDURE TODAY AT THE [facility]:   Refer to the procedure report that was given to you for any specific questions about what was found during the examination.  If the procedure report does not answer your questions, please call your gastroenterologist to clarify.  If you requested that your care partner not be given the details of your procedure findings, then the procedure report has been included in a sealed envelope for you to review at your convenience later.  YOU SHOULD EXPECT: Some feelings of bloating in the abdomen. Passage of more gas than usual.  Walking can help get rid of the air that was put into your GI tract during the procedure and reduce the bloating. If you had a lower endoscopy (such as a colonoscopy or flexible sigmoidoscopy) you may notice spotting of blood in your stool or on the toilet paper. If you underwent a bowel prep for your procedure, you may not have a normal bowel movement for a few days.  Please Note:  You might notice some irritation and congestion in your nose or some drainage.  This is from the oxygen used during your procedure.  There is no need for concern and it should clear up in a day or so.  SYMPTOMS TO REPORT IMMEDIATELY:  Following lower endoscopy (colonoscopy or flexible sigmoidoscopy):  Excessive amounts of blood in the stool  Significant tenderness or worsening of abdominal pains  Swelling of the abdomen that is new, acute  Fever of 100F or higher  For urgent or emergent issues, a gastroenterologist can be reached at any hour by calling [phone]. Do not use MyChart messaging for urgent concerns.    DIET:  We do recommend a small meal at first, but then you may proceed to your regular diet.  Drink plenty of fluids but you should avoid  alcoholic beverages for 24 hours.  ACTIVITY:  You should plan to take it easy for the rest of today and you should NOT DRIVE or use heavy machinery until tomorrow (because of the sedation medicines used during the test).    FOLLOW UP: Our staff will call the number listed on your records the next business day following your procedure.  We will call around 7:15- 8:00 am to check on you and address any questions or concerns that you may have regarding the information given to you following your procedure. If we do not reach you, we will leave a message.     If any biopsies were taken you will be contacted by phone or by letter within the next 1-3 weeks.  Please call us at [phone] if you have not heard about the biopsies in 3 weeks.    SIGNATURES/CONFIDENTIALITY: You and/or your care partner have signed paperwork which will be entered into your electronic medical record.  These signatures attest to the fact that that the information above on your After Visit Summary has been reviewed and is understood.  Full responsibility of the confidentiality of this discharge information lies with you and/or your care-partner.

## 2023-07-21 NOTE — Progress Notes (Signed)
Uneventful anesthetic. Report to pacu rn. Vss. Care resumed by rn. 

## 2023-07-21 NOTE — Progress Notes (Signed)
Pt's states no medical or surgical changes since previsit or office visit. 
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
## 2023-07-22 ENCOUNTER — Telehealth: Payer: Self-pay

## 2023-07-22 NOTE — Telephone Encounter (Signed)
  Follow up Call-     07/21/2023    8:30 AM 03/19/2021    7:40 AM  Call back number  Post procedure Call Back phone  # [phone]  Permission to leave phone message Yes Yes     Patient questions:  Do you have a fever, pain , or abdominal swelling? No. Pain Score  0 *  Have you tolerated food without any problems? Yes.    Have you been able to return to your normal activities? Yes.    Do you have any questions about your discharge instructions: Diet   No. Medications  No. Follow up visit  No.  Do you have questions or concerns about your Care? No.  Actions: * If pain score is 4 or above: No action needed, pain <4.

## 2023-07-25 ENCOUNTER — Encounter: Payer: Self-pay | Admitting: Gastroenterology

## 2023-08-05 DIAGNOSIS — H5203 Hypermetropia, bilateral: Secondary | ICD-10-CM | POA: Diagnosis not present

## 2023-08-05 DIAGNOSIS — Z961 Presence of intraocular lens: Secondary | ICD-10-CM | POA: Diagnosis not present

## 2023-08-05 DIAGNOSIS — H26491 Other secondary cataract, right eye: Secondary | ICD-10-CM | POA: Diagnosis not present

## 2023-08-11 ENCOUNTER — Other Ambulatory Visit: Payer: Self-pay

## 2023-08-11 ENCOUNTER — Telehealth: Payer: Self-pay | Admitting: Cardiology

## 2023-08-11 DIAGNOSIS — I4891 Unspecified atrial fibrillation: Secondary | ICD-10-CM

## 2023-08-11 MED ORDER — XARELTO 20 MG PO TABS: 20.0000 mg | ORAL_TABLET | Freq: Every day | ORAL | 1 refills | Status: DC

## 2023-08-11 MED ORDER — VERAPAMIL HCL ER 240 MG PO CP24: 240.0000 mg | ORAL_CAPSULE | Freq: Every day | ORAL | 2 refills | Status: DC

## 2023-08-11 NOTE — Telephone Encounter (Signed)
*  STAT* If patient is at the pharmacy, call can be transferred to refill team.   1. Which medications need to be refilled? (please list name of each medication and dose if known) XARELTO 20 MG TABS tablet   2. Would you like to learn more about the convenience, safety, & potential cost savings by using the [facility]? No   3. Are you open to using the [facility] No   4. Which pharmacy/location (including street and city if local pharmacy) is medication to be sent to?[id number] - [place], [place] - [address] AT [facility] [address]   5. Do they need a 30 day or 90 day supply? 90 Day Supply

## 2023-08-11 NOTE — Telephone Encounter (Signed)
Prescription refill request for Xarelto received.  Indication: Afib  Last office visit: 10/14/22 Edward Watson)  Weight: 124.3kg Age: 77 Scr: 0.71 (04/03/23)  CrCl: 158.49ml/min  Appropriate dose. Refill sent.

## 2023-10-09 ENCOUNTER — Encounter: Payer: Self-pay | Admitting: Family Medicine

## 2023-10-09 ENCOUNTER — Ambulatory Visit: Payer: PPO | Admitting: Family Medicine

## 2023-10-09 VITALS — BP 128/78 | HR 77 | Temp 98.0°F | Ht 72.15 in | Wt 276.8 lb

## 2023-10-09 DIAGNOSIS — R7303 Prediabetes: Secondary | ICD-10-CM

## 2023-10-09 DIAGNOSIS — E785 Hyperlipidemia, unspecified: Secondary | ICD-10-CM

## 2023-10-09 DIAGNOSIS — Z0000 Encounter for general adult medical examination without abnormal findings: Secondary | ICD-10-CM

## 2023-10-09 DIAGNOSIS — I10 Essential (primary) hypertension: Secondary | ICD-10-CM

## 2023-10-09 DIAGNOSIS — Z1329 Encounter for screening for other suspected endocrine disorder: Secondary | ICD-10-CM

## 2023-10-09 LAB — CBC WITH DIFFERENTIAL/PLATELET
Basophils Absolute: 0 10*3/uL (ref 0.0–0.1)
Basophils Relative: 0.5 % (ref 0.0–3.0)
Eosinophils Absolute: 0.3 10*3/uL (ref 0.0–0.7)
Eosinophils Relative: 4.6 % (ref 0.0–5.0)
HCT: 43.7 % (ref 39.0–52.0)
Hemoglobin: 14.5 g/dL (ref 13.0–17.0)
Lymphocytes Relative: 37.7 % (ref 12.0–46.0)
Lymphs Abs: 2.7 10*3/uL (ref 0.7–4.0)
MCHC: 33.2 g/dL (ref 30.0–36.0)
MCV: 94.1 fL (ref 78.0–100.0)
Monocytes Absolute: 0.4 10*3/uL (ref 0.1–1.0)
Monocytes Relative: 6.3 % (ref 3.0–12.0)
Neutro Abs: 3.6 10*3/uL (ref 1.4–7.7)
Neutrophils Relative %: 50.9 % (ref 43.0–77.0)
Platelets: 200 10*3/uL (ref 150.0–400.0)
RBC: 4.64 Mil/uL (ref 4.22–5.81)
RDW: 13.7 % (ref 11.5–15.5)
WBC: 7.1 10*3/uL (ref 4.0–10.5)

## 2023-10-09 LAB — HEMOGLOBIN A1C: Hgb A1c MFr Bld: 5.7 % (ref 4.6–6.5)

## 2023-10-09 LAB — COMPREHENSIVE METABOLIC PANEL
ALT: 21 U/L (ref 0–53)
AST: 20 U/L (ref 0–37)
Albumin: 4.5 g/dL (ref 3.5–5.2)
Alkaline Phosphatase: 72 U/L (ref 39–117)
BUN: 10 mg/dL (ref 6–23)
CO2: 31 meq/L (ref 19–32)
Calcium: 9.5 mg/dL (ref 8.4–10.5)
Chloride: 103 meq/L (ref 96–112)
Creatinine, Ser: 0.71 mg/dL (ref 0.40–1.50)
GFR: 89.89 mL/min (ref >60.00)
Glucose, Bld: 106 mg/dL — ABNORMAL HIGH (ref 70–99)
Potassium: 4.8 meq/L (ref 3.5–5.1)
Sodium: 141 meq/L (ref 135–145)
Total Bilirubin: 1.1 mg/dL (ref 0.2–1.2)
Total Protein: 6.9 g/dL (ref 6.0–8.3)

## 2023-10-09 LAB — LIPID PANEL
Cholesterol: 108 mg/dL (ref 0–200)
HDL: 33.6 mg/dL — ABNORMAL LOW (ref >39.00)
LDL Cholesterol: 51 mg/dL (ref 0–99)
NonHDL: 74.7
Total CHOL/HDL Ratio: 3
Triglycerides: 118 mg/dL (ref 0.0–149.0)
VLDL: 23.6 mg/dL (ref 0.0–40.0)

## 2023-10-09 LAB — TSH: TSH: 2.65 u[IU]/mL (ref 0.35–5.50)

## 2023-10-09 MED ORDER — ATORVASTATIN CALCIUM 40 MG PO TABS: 40.0000 mg | ORAL_TABLET | Freq: Every day | ORAL | 2 refills | Status: DC

## 2023-10-09 MED ORDER — NIACIN ER (ANTIHYPERLIPIDEMIC) 1000 MG PO TBCR: EXTENDED_RELEASE_TABLET | ORAL | 1 refills | Status: DC

## 2023-10-09 NOTE — Patient Instructions (Signed)
Thanks for coming in today. No change in meds at this time. If any concerns on labs I will let you know. Take care!  Preventive Care 37 Years and Older, Male Preventive care refers to lifestyle choices and visits with your health care provider that can promote health and wellness. Preventive care visits are also called wellness exams. What can I expect for my preventive care visit? Counseling During your preventive care visit, your health care provider may ask about your: Medical history, including: Past medical problems. Family medical history. History of falls. Current health, including: Emotional well-being. Home life and relationship well-being. Sexual activity. Memory and ability to understand (cognition). Lifestyle, including: Alcohol, nicotine or tobacco, and drug use. Access to firearms. Diet, exercise, and sleep habits. Work and work Astronomer. Sunscreen use. Safety issues such as seatbelt and bike helmet use. Physical exam Your health care provider will check your: Height and weight. These may be used to calculate your BMI (body mass index). BMI is a measurement that tells if you are at a healthy weight. Waist circumference. This measures the distance around your waistline. This measurement also tells if you are at a healthy weight and may help predict your risk of certain diseases, such as type 2 diabetes and high blood pressure. Heart rate and blood pressure. Body temperature. Skin for abnormal spots. What immunizations do I need?  Vaccines are usually given at various ages, according to a schedule. Your health care provider will recommend vaccines for you based on your age, medical history, and lifestyle or other factors, such as travel or where you work. What tests do I need? Screening Your health care provider may recommend screening tests for certain conditions. This may include: Lipid and cholesterol levels. Diabetes screening. This is done by checking your  blood sugar (glucose) after you have not eaten for a while (fasting). Hepatitis C test. Hepatitis B test. HIV (human immunodeficiency virus) test. STI (sexually transmitted infection) testing, if you are at risk. Lung cancer screening. Colorectal cancer screening. Prostate cancer screening. Abdominal aortic aneurysm (AAA) screening. You may need this if you are a current or former smoker. Talk with your health care provider about your test results, treatment options, and if necessary, the need for more tests. Follow these instructions at home: Eating and drinking  Eat a diet that includes fresh fruits and vegetables, whole grains, lean protein, and low-fat dairy products. Limit your intake of foods with high amounts of sugar, saturated fats, and salt. Take vitamin and mineral supplements as recommended by your health care provider. Do not drink alcohol if your health care provider tells you not to drink. If you drink alcohol: Limit how much you have to 0-2 drinks a day. Know how much alcohol is in your drink. In the [place] one drink equals one 12 oz bottle of beer (355 mL), one 5 oz glass of wine (148 mL), or one 1 oz glass of hard liquor (44 mL). Lifestyle Brush your teeth every morning and night with fluoride toothpaste. Floss one time each day. Exercise for at least 30 minutes 5 or more days each week. Do not use any products that contain nicotine or tobacco. These products include cigarettes, chewing tobacco, and vaping devices, such as e-cigarettes. If you need help quitting, ask your health care provider. Do not use drugs. If you are sexually active, practice safe sex. Use a condom or other form of protection to prevent STIs. Take aspirin only as told by your health care provider. Make sure  that you understand how much to take and what form to take. Work with your health care provider to find out whether it is safe and beneficial for you to take aspirin daily. Ask your health care  provider if you need to take a cholesterol-lowering medicine (statin). Find healthy ways to manage stress, such as: Meditation, yoga, or listening to music. Journaling. Talking to a trusted person. Spending time with friends and family. Safety Always wear your seat belt while driving or riding in a vehicle. Do not drive: If you have been drinking alcohol. Do not ride with someone who has been drinking. When you are tired or distracted. While texting. If you have been using any mind-altering substances or drugs. Wear a helmet and other protective equipment during sports activities. If you have firearms in your house, make sure you follow all gun safety procedures. Minimize exposure to UV radiation to reduce your risk of skin cancer. What's next? Visit your health care provider once a year for an annual wellness visit. Ask your health care provider how often you should have your eyes and teeth checked. Stay up to date on all vaccines. This information is not intended to replace advice given to you by your health care provider. Make sure you discuss any questions you have with your health care provider. Document Revised: 05/01/2021 Document Reviewed: 05/01/2021 Elsevier Patient Education  2024 [employer].
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
## 2023-10-13 ENCOUNTER — Ambulatory Visit: Payer: PPO

## 2023-10-13 ENCOUNTER — Ambulatory Visit: Payer: Self-pay | Admitting: Cardiology

## 2023-10-21 ENCOUNTER — Telehealth: Payer: Self-pay

## 2023-10-21 NOTE — Telephone Encounter (Signed)
Blood counts were normal.  Blood sugar few points elevated but overall electrolytes looked okay.  Thyroid test was normal.  Cholesterol levels were stable.  31-month blood sugar test has slightly increased and barely at prediabetes level at this time.  Continue to watch diet, stay active and recheck levels in 6 months.  Let me know if you have questions.   Dr. Neva Seat

## 2023-10-22 NOTE — Telephone Encounter (Signed)
 Lab results have been discussed.   Verbalized understanding? Yes  Are there any questions? No

## 2023-10-27 DIAGNOSIS — Z85828 Personal history of other malignant neoplasm of skin: Secondary | ICD-10-CM | POA: Diagnosis not present

## 2023-10-27 DIAGNOSIS — I8311 Varicose veins of right lower extremity with inflammation: Secondary | ICD-10-CM | POA: Diagnosis not present

## 2023-10-27 DIAGNOSIS — I8312 Varicose veins of left lower extremity with inflammation: Secondary | ICD-10-CM | POA: Diagnosis not present

## 2023-10-27 DIAGNOSIS — L853 Xerosis cutis: Secondary | ICD-10-CM | POA: Diagnosis not present

## 2023-10-27 DIAGNOSIS — D485 Neoplasm of uncertain behavior of skin: Secondary | ICD-10-CM | POA: Diagnosis not present

## 2023-10-27 DIAGNOSIS — I872 Venous insufficiency (chronic) (peripheral): Secondary | ICD-10-CM | POA: Diagnosis not present

## 2023-10-27 DIAGNOSIS — L570 Actinic keratosis: Secondary | ICD-10-CM | POA: Diagnosis not present

## 2023-10-27 DIAGNOSIS — L821 Other seborrheic keratosis: Secondary | ICD-10-CM | POA: Diagnosis not present

## 2023-10-27 DIAGNOSIS — L308 Other specified dermatitis: Secondary | ICD-10-CM | POA: Diagnosis not present

## 2023-10-27 DIAGNOSIS — C44319 Basal cell carcinoma of skin of other parts of face: Secondary | ICD-10-CM | POA: Diagnosis not present

## 2023-10-28 ENCOUNTER — Ambulatory Visit: Payer: PPO | Admitting: Cardiology

## 2023-10-28 ENCOUNTER — Encounter: Payer: Self-pay | Admitting: Cardiology

## 2023-10-28 VITALS — BP 130/80 | HR 91 | Resp 16 | Ht 72.0 in | Wt 277.8 lb

## 2023-10-28 DIAGNOSIS — I10 Essential (primary) hypertension: Secondary | ICD-10-CM

## 2023-10-28 DIAGNOSIS — E7800 Pure hypercholesterolemia, unspecified: Secondary | ICD-10-CM

## 2023-10-28 DIAGNOSIS — I4821 Permanent atrial fibrillation: Secondary | ICD-10-CM

## 2023-10-28 MED ORDER — XARELTO 20 MG PO TABS: 20.0000 mg | ORAL_TABLET | Freq: Every day | ORAL | 0 refills | Status: DC

## 2023-10-28 NOTE — Patient Instructions (Signed)
Medication Instructions:  The current medical regimen is effective;  continue present plan and medications.  *If you need a refill on your cardiac medications before your next appointment, please call your pharmacy*  Follow-Up: At [facility], you and your health needs are our priority.  As part of our continuing mission to provide you with exceptional heart care, we have created designated Provider Care Teams.  These Care Teams include your primary Cardiologist (physician) and Advanced Practice Providers (APPs -  Physician Assistants and Nurse Practitioners) who all work together to provide you with the care you need, when you need it.  We recommend signing up for the patient portal called "MyChart".  Sign up information is provided on this After Visit Summary.  MyChart is used to connect with patients for Virtual Visits (Telemedicine).  Patients are able to view lab/test results, encounter notes, upcoming appointments, etc.  Non-urgent messages can be sent to your provider as well.   To learn more about what you can do with MyChart, go to [email or web address].    Your next appointment:   Follow up as needed.  

## 2023-10-28 NOTE — Progress Notes (Signed)
Cardiology Office Note:  .   Date:  10/28/2023  ID:  Edward Watson, DOB [birth year], MRN [id number] PCP: Shade Flood, MD  [facility] Providers Cardiologist:  None   History of Present Illness: Marland Kitchen   Edward Watson is a 77 y.o. male with chroninc atrial fibrillation anticoagulated on Xarelto. OSA compliant with CPAP, tobacco use disorder quit in Dec 2016 with 50 pack year history, hypertension, and hyperlipidemia.   Patient presents for annual follow-up. Overall, he is doing well and remains asymptomatic.  Discussed the use of AI scribe software for clinical note transcription with the patient, who gave verbal consent to proceed.  History of Present Illness   The patient, with a history of atrial fibrillation, hypertension, and hyperlipidemia, presents for a routine follow-up. He reports no new concerns or symptoms. He denies any bleeding problems, black stools, blood in the stool, or blood in the urine. He is currently taking Xarelto for atrial fibrillation, verapamil for hypertension and atrial fibrillation, atorvastatin for hyperlipidemia, and nesina for diabetes. He checks his blood pressure a couple of times a week, which is usually in the high 120s or right at 130, and around 80. He denies any leg swelling and reports that he is walking and getting exercise daily. He also reports using a CPAP machine, which is working well.      Review of Systems  Cardiovascular:  Negative for chest pain, dyspnea on exertion and leg swelling.    Labs   Lab Results  Component Value Date   CHOL 108 10/09/2023   HDL 33.60 (L) 10/09/2023   LDLCALC 51 10/09/2023   TRIG 118.0 10/09/2023   CHOLHDL 3 10/09/2023   Lab Results  Component Value Date   NA 141 10/09/2023   K 4.8 10/09/2023   CO2 31 10/09/2023   GLUCOSE 106 (H) 10/09/2023   BUN 10 10/09/2023   CREATININE 0.71 10/09/2023   CALCIUM 9.5 10/09/2023   GFR 89.89 10/09/2023   GFRNONAA >60 08/28/2022      Latest Ref  Rng & Units 10/09/2023   10:19 AM 04/03/2023    8:58 AM 10/03/2022    9:37 AM  BMP  Glucose 70 - 99 mg/dL 841  324  401   BUN 6 - 23 mg/dL 10  11  9    Creatinine 0.40 - 1.50 mg/dL 0.27  2.53  6.64   Sodium 135 - 145 mEq/L 141  138  139   Potassium 3.5 - 5.1 mEq/L 4.8  5.0  4.4   Chloride 96 - 112 mEq/L 103  102  103   CO2 19 - 32 mEq/L 31  31  31    Calcium 8.4 - 10.5 mg/dL 9.5  9.6  9.3       Latest Ref Rng & Units 10/09/2023   10:19 AM 04/03/2023    8:58 AM 10/03/2022    9:37 AM  CBC  WBC 4.0 - 10.5 K/uL 7.1  6.6  6.7   Hemoglobin 13.0 - 17.0 g/dL 40.3  47.4  25.9   Hematocrit 39.0 - 52.0 % 43.7  41.2  41.2   Platelets 150.0 - 400.0 K/uL 200.0  188.0  193.0     Physical Exam:   VS:  BP 130/80 (BP Location: Left Arm, Patient Position: Sitting, Cuff Size: Large)   Pulse 91   Resp 16   Ht 6' (1.829 m)   Wt 277 lb 12.8 oz (126 kg)   SpO2 95%   BMI 37.68 kg/m  Wt Readings from Last 3 Encounters:  10/28/23 277 lb 12.8 oz (126 kg)  10/09/23 276 lb 12.8 oz (125.6 kg)  07/21/23 274 lb (124.3 kg)     Physical Exam Neck:     Vascular: No carotid bruit or JVD.  Cardiovascular:     Rate and Rhythm: Normal rate. Rhythm irregular.     Pulses: Normal pulses and intact distal pulses.     Heart sounds: No murmur heard. Pulmonary:     Effort: Pulmonary effort is normal.     Breath sounds: Normal breath sounds.  Abdominal:     General: Bowel sounds are normal.     Palpations: Abdomen is soft.  Musculoskeletal:     Right lower leg: Edema (1-2+ pitting below knee) present.     Left lower leg: Edema (1-2+ pitting below knee) present.  Skin:    Capillary Refill: Capillary refill takes less than 2 seconds.     Studies Reviewed: .    Echocardiogram 06/20/2020: Normal LV systolic function with visual EF 55-60%. Left ventricle cavity is normal in size. Moderate left ventricular hypertrophy. Normal global wall motion. Unable to evaluate diastolic function due to atrial  fibrillation. Elevated LAP. Calculated EF 59%. Left atrial cavity is severely dilated. Grossly right atrial size is moderately dilated. Mild (Grade I) aortic regurgitation. Aortic sclerosis without stenosis. Mild tricuspid regurgitation. Mild pulmonary hypertension. RVSP measures 38 mmHg. Insignificant pericardial effusion. There is no hemodynamic significance. The aortic root is dilated, sinus tubular junction 3.8cm IVC is dilated with a respiratory response of <50%. Compared to prior study dated 11/21/2014: Mild AR, Mild TR, mild PHTN, and aortic dilation are new findings.   CT scan for lung cancer screening 10/20/2022: 1. Lung-RADS Category 2, benign appearance or behavior. Continue annual screening with low-dose chest CT without contrast in 12 months. 2. Aortic Atherosclerosis (ICD10-I70.0) and Emphysema (ICD10-J43.9). Coronary artery atherosclerosis. 3. Aortic valvular calcifications. Consider echocardiography to evaluate for valvular dysfunction.    EKG:     EKG Interpretation Date/Time:  Wednesday October 28 2023 16:24:45 EST Ventricular Rate:  89 PR Interval:    QRS Duration:  116 QT Interval:  372 QTC Calculation: 452 R Axis:   -2  Text Interpretation: EKG 10/28/2023: Atrial fibrillation with controlled ventricular response at the rate of 89 bpm.  No significant change from 02/06/2015. Confirmed by Delrae Rend [phone]) on 10/28/2023 4:58:25 PM    EKG 10/14/2022: Atrial fibrillation with controlled ventricular response at 68 bpm.  Normal axis.  Low voltage complexes.  Old anterior infarct.  Compared to previous EKG on 10/15/2021, no significant change.   Medications and allergies    No Known Allergies   Current Outpatient Medications:    atorvastatin (LIPITOR) 40 MG tablet, Take 1 tablet (40 mg total) by mouth daily., Disp: 90 tablet, Rfl: 2   Multiple Vitamins-Minerals (CENTRUM SILVER 50+MEN) TABS, Take 1 tablet by mouth daily with breakfast., Disp: , Rfl:     niacin (NIASPAN) 1000 MG CR tablet, Take 1 tablet by mouth once daily as directed with a low fat snack and aspirin 30 minutes prior to taking medication, Disp: 90 tablet, Rfl: 1   verapamil (VERELAN) 240 MG 24 hr capsule, Take 1 capsule (240 mg total) by mouth at bedtime., Disp: 90 capsule, Rfl: 2   vitamin C (ASCORBIC ACID) 500 MG tablet, Take 500 mg by mouth daily., Disp: , Rfl:    XARELTO 20 MG TABS tablet, Take 1 tablet (20 mg total) by mouth daily., Disp: 90 tablet,  Rfl: 0   ASSESSMENT AND PLAN: .      ICD-10-CM   1. Permanent atrial fibrillation (HCC)  I48.21 EKG 12-Lead    2. Primary hypertension  I10     3. Hypercholesteremia  E78.00      Click Here to Calculate/Change CHADS2VASc Score The patient's CHADS2-VASc score is 4, indicating a 4.8% annual risk of stroke.  Therefore, anticoagulation is recommended.   CHF History: No HTN History: Yes Diabetes History: No Stroke History: No Vascular Disease History: Yes    Assessment and Plan    Atrial Fibrillation Stable on Xarelto with no reported bleeding issues. No blood in stool or urine. -Continue Xarelto as prescribed.  Hypertension Well controlled on Verapamil with home readings in the high 120s to 130s over 70s to 80s. -Continue Verapamil as prescribed.  Hyperlipidemia On Atorvastatin 40mg  daily. -Continue Atorvastatin as prescribed. Lipids at goal.   General Health Maintenance / Followup Plans -Continue use of CPAP machine for sleep apnea. -Encouraged weight loss. -Transition care back to Dr. Karyl Kinnier with the option to return if needed. -Next lung cancer screening due on 10/20/2022. -Colonoscopy due next year. -Refill Xarelto prescription.    Signed,  Yates Decamp, MD, [facility] 10/28/2023, 5:29 PM [facility] [address] [place], [place] [address] Phone: [phone]. Fax:  [phone]

## 2023-11-03 ENCOUNTER — Encounter: Payer: Self-pay | Admitting: Dermatology

## 2023-12-14 DIAGNOSIS — C44319 Basal cell carcinoma of skin of other parts of face: Secondary | ICD-10-CM | POA: Diagnosis not present

## 2023-12-14 DIAGNOSIS — Z85828 Personal history of other malignant neoplasm of skin: Secondary | ICD-10-CM | POA: Diagnosis not present

## 2023-12-16 ENCOUNTER — Encounter: Payer: Self-pay | Admitting: Family Medicine

## 2023-12-16 DIAGNOSIS — C44310 Basal cell carcinoma of skin of unspecified parts of face: Secondary | ICD-10-CM | POA: Insufficient documentation

## 2024-01-23 ENCOUNTER — Other Ambulatory Visit: Payer: Self-pay | Admitting: Cardiology

## 2024-01-25 NOTE — Telephone Encounter (Signed)
 Prescription refill request for Xarelto received.  Indication:afib Last office visit:12/24 Weight:126  kg Age:78 Scr:0.71  11/24 CrCl:160.21  ml/min  Prescription refilled

## 2024-02-25 DIAGNOSIS — L570 Actinic keratosis: Secondary | ICD-10-CM | POA: Diagnosis not present

## 2024-02-25 DIAGNOSIS — Z85828 Personal history of other malignant neoplasm of skin: Secondary | ICD-10-CM | POA: Diagnosis not present

## 2024-03-31 ENCOUNTER — Other Ambulatory Visit: Payer: Self-pay | Admitting: Family Medicine

## 2024-03-31 DIAGNOSIS — E785 Hyperlipidemia, unspecified: Secondary | ICD-10-CM

## 2024-04-08 ENCOUNTER — Encounter: Payer: Self-pay | Admitting: Family Medicine

## 2024-04-08 ENCOUNTER — Ambulatory Visit (INDEPENDENT_AMBULATORY_CARE_PROVIDER_SITE_OTHER): Payer: PPO | Admitting: Family Medicine

## 2024-04-08 VITALS — BP 124/72 | HR 70 | Temp 98.0°F | Resp 17 | Ht 72.0 in | Wt 277.0 lb

## 2024-04-08 DIAGNOSIS — Z7901 Long term (current) use of anticoagulants: Secondary | ICD-10-CM

## 2024-04-08 DIAGNOSIS — R7303 Prediabetes: Secondary | ICD-10-CM

## 2024-04-08 DIAGNOSIS — E785 Hyperlipidemia, unspecified: Secondary | ICD-10-CM

## 2024-04-08 DIAGNOSIS — H9319 Tinnitus, unspecified ear: Secondary | ICD-10-CM | POA: Diagnosis not present

## 2024-04-08 DIAGNOSIS — I4891 Unspecified atrial fibrillation: Secondary | ICD-10-CM | POA: Diagnosis not present

## 2024-04-08 DIAGNOSIS — I10 Essential (primary) hypertension: Secondary | ICD-10-CM | POA: Diagnosis not present

## 2024-04-08 DIAGNOSIS — Z122 Encounter for screening for malignant neoplasm of respiratory organs: Secondary | ICD-10-CM

## 2024-04-08 LAB — CBC
HCT: 41.6 % (ref 39.0–52.0)
Hemoglobin: 14 g/dL (ref 13.0–17.0)
MCHC: 33.7 g/dL (ref 30.0–36.0)
MCV: 91.1 fl (ref 78.0–100.0)
Platelets: 174 10*3/uL (ref 150.0–400.0)
RBC: 4.57 Mil/uL (ref 4.22–5.81)
RDW: 13.8 % (ref 11.5–15.5)
WBC: 6.7 10*3/uL (ref 4.0–10.5)

## 2024-04-08 LAB — LIPID PANEL
Cholesterol: 111 mg/dL (ref 0–200)
HDL: 40.8 mg/dL (ref >39.00)
LDL Cholesterol: 52 mg/dL (ref 0–99)
NonHDL: 70.22
Total CHOL/HDL Ratio: 3
Triglycerides: 89 mg/dL (ref 0.0–149.0)
VLDL: 17.8 mg/dL (ref 0.0–40.0)

## 2024-04-08 LAB — COMPREHENSIVE METABOLIC PANEL WITH GFR
ALT: 23 U/L (ref 0–53)
AST: 23 U/L (ref 0–37)
Albumin: 4.6 g/dL (ref 3.5–5.2)
Alkaline Phosphatase: 63 U/L (ref 39–117)
BUN: 11 mg/dL (ref 6–23)
CO2: 31 meq/L (ref 19–32)
Calcium: 9.7 mg/dL (ref 8.4–10.5)
Chloride: 103 meq/L (ref 96–112)
Creatinine, Ser: 0.68 mg/dL (ref 0.40–1.50)
GFR: 90.75 mL/min (ref >60.00)
Glucose, Bld: 102 mg/dL — ABNORMAL HIGH (ref 70–99)
Potassium: 4.8 meq/L (ref 3.5–5.1)
Sodium: 141 meq/L (ref 135–145)
Total Bilirubin: 1.3 mg/dL — ABNORMAL HIGH (ref 0.2–1.2)
Total Protein: 7 g/dL (ref 6.0–8.3)

## 2024-04-08 LAB — HEMOGLOBIN A1C: Hgb A1c MFr Bld: 5.7 % (ref 4.6–6.5)

## 2024-04-08 MED ORDER — ATORVASTATIN CALCIUM 40 MG PO TABS: 40.0000 mg | ORAL_TABLET | Freq: Every day | ORAL | 2 refills | Status: DC

## 2024-04-08 MED ORDER — VERAPAMIL HCL ER 240 MG PO CP24
240.0000 mg | ORAL_CAPSULE | Freq: Every day | ORAL | 2 refills | Status: DC
Start: 2024-04-08 — End: 2024-10-12
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
## 2024-04-08 NOTE — Patient Instructions (Addendum)
 I ordered the repeat CT scan of the chest for lung cancer screening, but previous imaging was overall reassuring. See information below and ringing in the ears.  Would consider hearing test initially, let me know if you would like a referral or if any change in symptoms. No change in other medications at this time.  If any concerns on labs I will let you know. Take care!  Tinnitus Tinnitus is when you hear a sound that there's no actual source for. It may sound like ringing in your ears or something else. The sound may be loud, soft, or somewhere in between. It can last for a few seconds or be constant for days. It can come and go. Almost everyone has tinnitus at some point. It's not the same as hearing loss. But you may need to see a health care provider if: It lasts for a long time. It comes back often. You have trouble sleeping and focusing. What are the causes? The cause of tinnitus is often unknown. In some cases, you may get it if: You're around loud noises, such as from machines or music. An object gets stuck in your ear. Earwax builds up in Landscape architect. You drink a lot of alcohol or caffeine. You take certain medicines. You start to lose your hearing. You may also get it from some medical conditions. These may include: Ear or sinus infections. Heart diseases. High blood pressure. Allergies. Mnire's disease. Problems with your thyroid . A tumor. This is a growth of cells that isn't normal. A weak, bulging blood vessel called an aneurysm near your ear. What increases the risk? You may be more likely to get tinnitus if: You're around loud noises a lot. You're older. You drink alcohol. You smoke. What are the signs or symptoms? The main symptom is hearing a sound that there's no source for. It may sound like ringing. It may also sound like: Buzzing. Sizzling. Blowing air. Hissing. Whistling. Other sounds may include: Roaring. Running water. A musical  note. Tapping. Humming. You may have symptoms in one ear or both ears. How is this diagnosed? Tinnitus is diagnosed based on your symptoms, your medical history, and an exam. Your provider may do a full hearing test if your tinnitus: Is in just one ear. Makes it hard for you to hear. Lasts 6 months or longer. You may also need to see an expert in hearing disorders called an audiologist. They may ask you about your symptoms and how tinnitus affects your daily life. You may have other tests done. These may include: A CT scan. An MRI. An angiogram. This shows how blood flows through your blood vessels. How is this treated? Treatment may include: Therapy to help you manage the stress of living with tinnitus. Finding ways to mask or cover the sound of tinnitus. These include: Sound or white noise machines. Devices that fit in your ear and play sounds or music. A loud humidifier. Acoustic neural stimulation. This is when you use headphones to listen to music that has a special signal in it. Over time, this signal may change some of the pathways in your brain. This can make you less sensitive to tinnitus. This treatment is used for very severe cases. Using hearing aids or cochlear implants if your tinnitus is from hearing loss. If your tinnitus is caused by a medical condition, treating the condition may make it go away.  Follow these instructions at home: Managing symptoms     Try to avoid being in loud  places or around loud noises. Wear earplugs or headphones when you're around loud noises. Find ways to reduce stress. These may include meditation, yoga, or deep breathing. Sleep with your head slightly raised. General instructions Take over-the-counter and prescription medicines only as told by your provider. Track the things that cause symptoms (triggers). Try to avoid these things. To stop your tinnitus from getting worse: Do not drink alcohol. Do not have caffeine. Do not use any  products that contain nicotine or tobacco. These products include cigarettes, chewing tobacco, and vaping devices, such as e-cigarettes. If you need help quitting, ask your provider. Avoid using too much salt. Get enough sleep each night. Where to find more information American Tinnitus Association: https://www.johnson-hamilton.org/ Contact a health care provider if: Your symptoms last for 3 weeks or longer without stopping. You have sudden hearing loss. Your symptoms get worse or don't get better with home care. You can't manage the stress of living with tinnitus. Get help right away if: You get tinnitus after a head injury. You have tinnitus and: Dizziness. Nausea and vomiting. Loss of balance. A sudden, severe headache. Changes to your eyesight. Weakness in your face, arms, or legs. These symptoms may be an emergency. Get help right away. Call 911. Do not wait to see if the symptoms will go away. Do not drive yourself to the hospital. This information is not intended to replace advice given to you by your health care provider. Make sure you discuss any questions you have with your health care provider. Document Revised: 02/09/2023 Document Reviewed: 02/09/2023 Elsevier Patient Education  2024 [employer].

## 2024-04-12 ENCOUNTER — Ambulatory Visit: Payer: Self-pay | Admitting: Family Medicine

## 2024-04-15 ENCOUNTER — Ambulatory Visit
Admission: RE | Admit: 2024-04-15 | Discharge: 2024-04-15 | Disposition: A | Discharge status: Home / Self Care | Source: Ambulatory Visit | Attending: Family Medicine | Admitting: Family Medicine

## 2024-04-15 DIAGNOSIS — Z122 Encounter for screening for malignant neoplasm of respiratory organs: Secondary | ICD-10-CM | POA: Diagnosis not present

## 2024-04-15 DIAGNOSIS — I7121 Aneurysm of the ascending aorta, without rupture: Secondary | ICD-10-CM | POA: Diagnosis not present

## 2024-04-15 DIAGNOSIS — Z87891 Personal history of nicotine dependence: Secondary | ICD-10-CM | POA: Diagnosis not present

## 2024-05-17 ENCOUNTER — Other Ambulatory Visit: Payer: Self-pay | Admitting: Family Medicine

## 2024-05-17 DIAGNOSIS — I4891 Unspecified atrial fibrillation: Secondary | ICD-10-CM

## 2024-05-18 ENCOUNTER — Other Ambulatory Visit: Payer: Self-pay | Admitting: Family Medicine

## 2024-05-18 DIAGNOSIS — I4891 Unspecified atrial fibrillation: Secondary | ICD-10-CM

## 2024-05-18 NOTE — Telephone Encounter (Signed)
 Copied from CRM [id number]. Topic: Clinical - Medication Refill >> May 18, 2024 10:54 AM Aleatha C wrote: Medication: Refused Verapamil  HCl 240 MG  Has the patient contacted their pharmacy? Yes (Agent: If no, request that the patient contact the pharmacy for the refill. If patient does not wish to contact the pharmacy document the reason why and proceed with request.) (Agent: If yes, when and what did the pharmacy advise?)  This is the patient's preferred pharmacy:  [id number] [address], [place] - [address] AT [facility] & [address] [place] [address] Phone: [phone] Fax: [phone]    Is this the correct pharmacy for this prescription? Yes If no, delete pharmacy and type the correct one.   Has the prescription been filled recently? No  Is the patient out of the medication? No  Has the patient been seen for an appointment in the last year OR does the patient have an upcoming appointment? Yes  Can we respond through MyChart? No  Agent: Please be advised that Rx refills may take up to 3 business days. We ask that you follow-up with your pharmacy.

## 2024-07-21 ENCOUNTER — Other Ambulatory Visit: Payer: Self-pay | Admitting: Cardiology

## 2024-07-21 NOTE — Telephone Encounter (Signed)
 Prescription refill request for Xarelto  received.  Indication: afib  Last office visit: 10/28/2023 Weight: 125.6 kg  Age: 78 yo  Scr: 0.68, 04/08/2024 CrCl: 164 ml/min   Refill sent.

## 2024-08-29 DIAGNOSIS — L570 Actinic keratosis: Secondary | ICD-10-CM | POA: Diagnosis not present

## 2024-08-29 DIAGNOSIS — Z85828 Personal history of other malignant neoplasm of skin: Secondary | ICD-10-CM | POA: Diagnosis not present

## 2024-09-28 ENCOUNTER — Other Ambulatory Visit: Payer: Self-pay | Admitting: Family Medicine

## 2024-09-28 DIAGNOSIS — E785 Hyperlipidemia, unspecified: Secondary | ICD-10-CM

## 2024-09-29 NOTE — Telephone Encounter (Signed)
 Requested Prescriptions   Pending Prescriptions Disp Refills   niacin  (NIASPAN ) 1000 MG CR tablet [Pharmacy Med Name: NIACIN  ER 1000MG  TABLETS] 90 tablet 1    Sig: TAKE 1 TABLET BY MOUTH EVERY DAY WITH LOW FAT SNACK AND ASPIRIN 30 MINUTES PRIOR     Date of patient request: 09/29/2024 Last office visit: 04/08/2024 Upcoming visit: 10/12/2024 Date of last refill: 03/31/2024 Last refill amount: 90x1

## 2024-10-12 ENCOUNTER — Other Ambulatory Visit: Payer: Self-pay | Admitting: Cardiology

## 2024-10-12 ENCOUNTER — Other Ambulatory Visit: Payer: Self-pay | Admitting: Family Medicine

## 2024-10-12 ENCOUNTER — Ambulatory Visit (INDEPENDENT_AMBULATORY_CARE_PROVIDER_SITE_OTHER): Admitting: Family Medicine

## 2024-10-12 ENCOUNTER — Encounter: Payer: Self-pay | Admitting: Family Medicine

## 2024-10-12 VITALS — BP 120/72 | HR 69 | Temp 98.2°F | Resp 18 | Ht 72.0 in | Wt 275.6 lb

## 2024-10-12 DIAGNOSIS — Z7901 Long term (current) use of anticoagulants: Secondary | ICD-10-CM | POA: Diagnosis not present

## 2024-10-12 DIAGNOSIS — I10 Essential (primary) hypertension: Secondary | ICD-10-CM | POA: Diagnosis not present

## 2024-10-12 DIAGNOSIS — E785 Hyperlipidemia, unspecified: Secondary | ICD-10-CM

## 2024-10-12 DIAGNOSIS — Z0000 Encounter for general adult medical examination without abnormal findings: Secondary | ICD-10-CM | POA: Diagnosis not present

## 2024-10-12 DIAGNOSIS — Z1329 Encounter for screening for other suspected endocrine disorder: Secondary | ICD-10-CM

## 2024-10-12 DIAGNOSIS — I4891 Unspecified atrial fibrillation: Secondary | ICD-10-CM

## 2024-10-12 DIAGNOSIS — I4821 Permanent atrial fibrillation: Secondary | ICD-10-CM

## 2024-10-12 DIAGNOSIS — R7303 Prediabetes: Secondary | ICD-10-CM

## 2024-10-12 LAB — CBC
HCT: 42.3 % (ref 39.0–52.0)
Hemoglobin: 14.1 g/dL (ref 13.0–17.0)
MCHC: 33.4 g/dL (ref 30.0–36.0)
MCV: 91.8 fl (ref 78.0–100.0)
Platelets: 184 K/uL (ref 150.0–400.0)
RBC: 4.61 Mil/uL (ref 4.22–5.81)
RDW: 13.6 % (ref 11.5–15.5)
WBC: 6.3 K/uL (ref 4.0–10.5)

## 2024-10-12 LAB — COMPREHENSIVE METABOLIC PANEL WITH GFR
ALT: 20 U/L (ref 0–53)
AST: 22 U/L (ref 0–37)
Albumin: 4.5 g/dL (ref 3.5–5.2)
Alkaline Phosphatase: 65 U/L (ref 39–117)
BUN: 11 mg/dL (ref 6–23)
CO2: 30 meq/L (ref 19–32)
Calcium: 9.7 mg/dL (ref 8.4–10.5)
Chloride: 103 meq/L (ref 96–112)
Creatinine, Ser: 0.67 mg/dL (ref 0.40–1.50)
GFR: 90.83 mL/min (ref >60.00)
Glucose, Bld: 105 mg/dL — ABNORMAL HIGH (ref 70–99)
Potassium: 4.5 meq/L (ref 3.5–5.1)
Sodium: 140 meq/L (ref 135–145)
Total Bilirubin: 1.2 mg/dL (ref 0.2–1.2)
Total Protein: 7.1 g/dL (ref 6.0–8.3)

## 2024-10-12 LAB — LIPID PANEL
Cholesterol: 107 mg/dL (ref 0–200)
HDL: 40 mg/dL (ref >39.00)
LDL Cholesterol: 51 mg/dL (ref 0–99)
NonHDL: 67.46
Total CHOL/HDL Ratio: 3
Triglycerides: 80 mg/dL (ref 0.0–149.0)
VLDL: 16 mg/dL (ref 0.0–40.0)

## 2024-10-12 LAB — TSH: TSH: 2.02 u[IU]/mL (ref 0.35–5.50)

## 2024-10-12 LAB — HEMOGLOBIN A1C: Hgb A1c MFr Bld: 5.5 % (ref 4.6–6.5)

## 2024-10-12 MED ORDER — ATORVASTATIN CALCIUM 40 MG PO TABS: 40.0000 mg | ORAL_TABLET | Freq: Every day | ORAL | 2 refills | Status: DC

## 2024-10-12 MED ORDER — VERAPAMIL HCL ER 240 MG PO CP24: 240.0000 mg | ORAL_CAPSULE | Freq: Every day | ORAL | 2 refills | Status: AC

## 2024-10-12 NOTE — Patient Instructions (Signed)
 Thank you for coming in today. No change in medications at this time.  Keep up the good work with walking, exercise.  Weight has improved by few pounds today.  If there are any concerns on your bloodwork, I will let you know. Take care!  Preventive Care 97 Years and Older, Male Preventive care refers to lifestyle choices and visits with your health care provider that can promote health and wellness. Preventive care visits are also called wellness exams. What can I expect for my preventive care visit? Counseling During your preventive care visit, your health care provider may ask about your: Medical history, including: Past medical problems. Family medical history. History of falls. Current health, including: Emotional well-being. Home life and relationship well-being. Sexual activity. Memory and ability to understand (cognition). Lifestyle, including: Alcohol, nicotine or tobacco, and drug use. Access to firearms. Diet, exercise, and sleep habits. Work and work astronomer. Sunscreen use. Safety issues such as seatbelt and bike helmet use. Physical exam Your health care provider will check your: Height and weight. These may be used to calculate your BMI (body mass index). BMI is a measurement that tells if you are at a healthy weight. Waist circumference. This measures the distance around your waistline. This measurement also tells if you are at a healthy weight and may help predict your risk of certain diseases, such as type 2 diabetes and high blood pressure. Heart rate and blood pressure. Body temperature. Skin for abnormal spots. What immunizations do I need?  Vaccines are usually given at various ages, according to a schedule. Your health care provider will recommend vaccines for you based on your age, medical history, and lifestyle or other factors, such as travel or where you work. What tests do I need? Screening Your health care provider may recommend screening tests for  certain conditions. This may include: Lipid and cholesterol levels. Diabetes screening. This is done by checking your blood sugar (glucose) after you have not eaten for a while (fasting). Hepatitis C test. Hepatitis B test. HIV (human immunodeficiency virus) test. STI (sexually transmitted infection) testing, if you are at risk. Lung cancer screening. Colorectal cancer screening. Prostate cancer screening. Abdominal aortic aneurysm (AAA) screening. You may need this if you are a current or former smoker. Talk with your health care provider about your test results, treatment options, and if necessary, the need for more tests. Follow these instructions at home: Eating and drinking  Eat a diet that includes fresh fruits and vegetables, whole grains, lean protein, and low-fat dairy products. Limit your intake of foods with high amounts of sugar, saturated fats, and salt. Take vitamin and mineral supplements as recommended by your health care provider. Do not drink alcohol if your health care provider tells you not to drink. If you drink alcohol: Limit how much you have to 0-2 drinks a day. Know how much alcohol is in your drink. In the [place] one drink equals one 12 oz bottle of beer (355 mL), one 5 oz glass of wine (148 mL), or one 1 oz glass of hard liquor (44 mL). Lifestyle Brush your teeth every morning and night with fluoride toothpaste. Floss one time each day. Exercise for at least 30 minutes 5 or more days each week. Do not use any products that contain nicotine or tobacco. These products include cigarettes, chewing tobacco, and vaping devices, such as e-cigarettes. If you need help quitting, ask your health care provider. Do not use drugs. If you are sexually active, practice safe sex. Use  a condom or other form of protection to prevent STIs. Take aspirin only as told by your health care provider. Make sure that you understand how much to take and what form to take. Work with your  health care provider to find out whether it is safe and beneficial for you to take aspirin daily. Ask your health care provider if you need to take a cholesterol-lowering medicine (statin). Find healthy ways to manage stress, such as: Meditation, yoga, or listening to music. Journaling. Talking to a trusted person. Spending time with friends and family. Safety Always wear your seat belt while driving or riding in a vehicle. Do not drive: If you have been drinking alcohol. Do not ride with someone who has been drinking. When you are tired or distracted. While texting. If you have been using any mind-altering substances or drugs. Wear a helmet and other protective equipment during sports activities. If you have firearms in your house, make sure you follow all gun safety procedures. Minimize exposure to UV radiation to reduce your risk of skin cancer. What's next? Visit your health care provider once a year for an annual wellness visit. Ask your health care provider how often you should have your eyes and teeth checked. Stay up to date on all vaccines. This information is not intended to replace advice given to you by your health care provider. Make sure you discuss any questions you have with your health care provider. Document Revised: 05/01/2021 Document Reviewed: 05/01/2021 Elsevier Patient Education  2024 [employer].
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
## 2024-10-12 NOTE — Telephone Encounter (Signed)
 Xarelto  20mg  refill request received. Pt is 78 years old, weight-125kg, Crea-0.67 on 10/12/24, last seen by Dr. Ladona on 10/28/23, Diagnosis-Afib, CrCl-165.84 mL/min; Dose is appropriate based on dosing criteria. Will send in refill to requested pharmacy.

## 2024-10-16 ENCOUNTER — Ambulatory Visit: Payer: Self-pay | Admitting: Family Medicine

## 2024-10-18 ENCOUNTER — Ambulatory Visit: Admitting: *Deleted

## 2024-10-18 VITALS — Ht 72.0 in | Wt 275.0 lb

## 2024-10-18 DIAGNOSIS — Z0000 Encounter for general adult medical examination without abnormal findings: Secondary | ICD-10-CM | POA: Diagnosis not present

## 2024-10-18 NOTE — Patient Instructions (Signed)
 Edward Watson,  Thank you for taking the time for your Medicare Wellness Visit. I appreciate your continued commitment to your health goals. Please review the care plan we discussed, and feel free to reach out if I can assist you further.  Please note that Annual Wellness Visits do not include a physical exam. Some assessments may be limited, especially if the visit was conducted virtually. If needed, we may recommend an in-person follow-up with your provider.  Ongoing Care Seeing your primary care provider every 3 to 6 months helps us  monitor your health and provide consistent, personalized care.   Referrals If a referral was made during today's visit and you haven't received any updates within two weeks, please contact the referred provider directly to check on the status.  Recommended Screenings:  Health Maintenance  Topic Date Due   COVID-19 Vaccine (4 - 2025-26 season) 10/28/2024*   Flu Shot  02/14/2025*   Screening for Lung Cancer  04/15/2025   Medicare Annual Wellness Visit  10/18/2025   Colon Cancer Screening  07/20/2026   Pneumococcal Vaccine for age over 29  Completed   Hepatitis C Screening  Completed   Zoster (Shingles) Vaccine  Completed   Meningitis B Vaccine  Aged Out   DTaP/Tdap/Td vaccine  Discontinued  *Topic was postponed. The date shown is not the original due date.       10/14/2024    8:54 AM  Advanced Directives  Does Patient Have a Medical Advance Directive? No  Would patient like information on creating a medical advance directive? No - Patient declined    Vision: Annual vision screenings are recommended for early detection of glaucoma, cataracts, and diabetic retinopathy. These exams can also reveal signs of chronic conditions such as diabetes and high blood pressure.  Dental: Annual dental screenings help detect early signs of oral cancer, gum disease, and other conditions linked to overall health, including heart disease and diabetes.  Please see the  attached documents for additional preventive care recommendations.   Edward Watson , Thank you for taking time to come for your Medicare Wellness Visit. I appreciate your ongoing commitment to your health goals. Please review the following plan we discussed and let me know if I can assist you in the future.   Screening recommendations/referrals: Colonoscopy:  Recommended yearly ophthalmology/optometry visit for glaucoma screening and checkup Recommended yearly dental visit for hygiene and checkup  Vaccinations: Influenza vaccine:  Pneumococcal vaccine:  Tdap vaccine:  Shingles vaccine:      Preventive Care 65 Years and Older, Male Preventive care refers to lifestyle choices and visits with your health care provider that can promote health and wellness. What does preventive care include? A yearly physical exam. This is also called an annual well check. Dental exams once or twice a year. Routine eye exams. Ask your health care provider how often you should have your eyes checked. Personal lifestyle choices, including: Daily care of your teeth and gums. Regular physical activity. Eating a healthy diet. Avoiding tobacco and drug use. Limiting alcohol use. Practicing safe sex. Taking low doses of aspirin every day. Taking vitamin and mineral supplements as recommended by your health care provider. What happens during an annual well check? The services and screenings done by your health care provider during your annual well check will depend on your age, overall health, lifestyle risk factors, and family history of disease. Counseling  Your health care provider may ask you questions about your: Alcohol use. Tobacco use. Drug use. Emotional well-being. Home  and relationship well-being. Sexual activity. Eating habits. History of falls. Memory and ability to understand (cognition). Work and work astronomer. Screening  You may have the following tests or measurements: Height,  weight, and BMI. Blood pressure. Lipid and cholesterol levels. These may be checked every 5 years, or more frequently if you are over 52 years old. Skin check. Lung cancer screening. You may have this screening every year starting at age 2 if you have a 30-pack-year history of smoking and currently smoke or have quit within the past 15 years. Fecal occult blood test (FOBT) of the stool. You may have this test every year starting at age 11. Flexible sigmoidoscopy or colonoscopy. You may have a sigmoidoscopy every 5 years or a colonoscopy every 10 years starting at age 26. Prostate cancer screening. Recommendations will vary depending on your family history and other risks. Hepatitis C blood test. Hepatitis B blood test. Sexually transmitted disease (STD) testing. Diabetes screening. This is done by checking your blood sugar (glucose) after you have not eaten for a while (fasting). You may have this done every 1-3 years. Abdominal aortic aneurysm (AAA) screening. You may need this if you are a current or former smoker. Osteoporosis. You may be screened starting at age 54 if you are at high risk. Talk with your health care provider about your test results, treatment options, and if necessary, the need for more tests. Vaccines  Your health care provider may recommend certain vaccines, such as: Influenza vaccine. This is recommended every year. Tetanus, diphtheria, and acellular pertussis (Tdap, Td) vaccine. You may need a Td booster every 10 years. Zoster vaccine. You may need this after age 70. Pneumococcal 13-valent conjugate (PCV13) vaccine. One dose is recommended after age 17. Pneumococcal polysaccharide (PPSV23) vaccine. One dose is recommended after age 74. Talk to your health care provider about which screenings and vaccines you need and how often you need them. This information is not intended to replace advice given to you by your health care provider. Make sure you discuss any  questions you have with your health care provider. Document Released: 11/30/2015 Document Revised: 07/23/2016 Document Reviewed: 09/04/2015 Elsevier Interactive Patient Education  2017 [employer].  Fall Prevention in the Home Falls can cause injuries. They can happen to people of all ages. There are many things you can do to make your home safe and to help prevent falls. What can I do on the outside of my home? Regularly fix the edges of walkways and driveways and fix any cracks. Remove anything that might make you trip as you walk through a door, such as a raised step or threshold. Trim any bushes or trees on the path to your home. Use bright outdoor lighting. Clear any walking paths of anything that might make someone trip, such as rocks or tools. Regularly check to see if handrails are loose or broken. Make sure that both sides of any steps have handrails. Any raised decks and porches should have guardrails on the edges. Have any leaves, snow, or ice cleared regularly. Use sand or salt on walking paths during winter. Clean up any spills in your garage right away. This includes oil or grease spills. What can I do in the bathroom? Use night lights. Install grab bars by the toilet and in the tub and shower. Do not use towel bars as grab bars. Use non-skid mats or decals in the tub or shower. If you need to sit down in the shower, use a plastic, non-slip stool.  Keep the floor dry. Clean up any water that spills on the floor as soon as it happens. Remove soap buildup in the tub or shower regularly. Attach bath mats securely with double-sided non-slip rug tape. Do not have throw rugs and other things on the floor that can make you trip. What can I do in the bedroom? Use night lights. Make sure that you have a light by your bed that is easy to reach. Do not use any sheets or blankets that are too big for your bed. They should not hang down onto the floor. Have a firm chair that has side  arms. You can use this for support while you get dressed. Do not have throw rugs and other things on the floor that can make you trip. What can I do in the kitchen? Clean up any spills right away. Avoid walking on wet floors. Keep items that you use a lot in easy-to-reach places. If you need to reach something above you, use a strong step stool that has a grab bar. Keep electrical cords out of the way. Do not use floor polish or wax that makes floors slippery. If you must use wax, use non-skid floor wax. Do not have throw rugs and other things on the floor that can make you trip. What can I do with my stairs? Do not leave any items on the stairs. Make sure that there are handrails on both sides of the stairs and use them. Fix handrails that are broken or loose. Make sure that handrails are as long as the stairways. Check any carpeting to make sure that it is firmly attached to the stairs. Fix any carpet that is loose or worn. Avoid having throw rugs at the top or bottom of the stairs. If you do have throw rugs, attach them to the floor with carpet tape. Make sure that you have a light switch at the top of the stairs and the bottom of the stairs. If you do not have them, ask someone to add them for you. What else can I do to help prevent falls? Wear shoes that: Do not have high heels. Have rubber bottoms. Are comfortable and fit you well. Are closed at the toe. Do not wear sandals. If you use a stepladder: Make sure that it is fully opened. Do not climb a closed stepladder. Make sure that both sides of the stepladder are locked into place. Ask someone to hold it for you, if possible. Clearly mark and make sure that you can see: Any grab bars or handrails. First and last steps. Where the edge of each step is. Use tools that help you move around (mobility aids) if they are needed. These include: Canes. Walkers. Scooters. Crutches. Turn on the lights when you go into a dark area.  Replace any light bulbs as soon as they burn out. Set up your furniture so you have a clear path. Avoid moving your furniture around. If any of your floors are uneven, fix them. If there are any pets around you, be aware of where they are. Review your medicines with your doctor. Some medicines can make you feel dizzy. This can increase your chance of falling. Ask your doctor what other things that you can do to help prevent falls. This information is not intended to replace advice given to you by your health care provider. Make sure you discuss any questions you have with your health care provider. Document Released: 08/30/2009 Document Revised: 04/10/2016 Document Reviewed: 12/08/2014  [employer]st Patient Education  [employer].

## 2024-10-18 NOTE — Progress Notes (Signed)
 Chief Complaint  Patient presents with   Medicare Wellness     Subjective:   Edward Watson is a 78 y.o. male who presents for a Medicare Annual Wellness Visit.  I connected with  Edward Watson on 10/18/24 by a audio enabled telemedicine application and verified that I am speaking with the correct person using two identifiers.  Patient Location: Home  Provider Location: Home Office  Persons Participating in Visit: Patient.  I discussed the limitations of evaluation and management by telemedicine. The patient expressed understanding and agreed to proceed.   Vital Signs: Because this visit was a virtual/telehealth visit, some criteria may be missing or patient reported. Any vitals not documented were not able to be obtained and vitals that have been documented are patient reported.      Visit info / Clinical Intake: Medicare Wellness Visit Type:: Subsequent Annual Wellness Visit Persons participating in visit and providing information:: patient Medicare Wellness Visit Mode:: Telephone If telephone:: video declined Since this visit was completed virtually, some vitals may be partially provided or unavailable. Missing vitals are due to the limitations of the virtual format.: Unable to obtain vitals - no equipment If Telephone or Video please confirm:: I connected with patient using audio/video enable telemedicine. I verified patient identity with two identifiers, discussed telehealth limitations, and patient agreed to proceed. Patient Location:: home Provider Location:: home Interpreter Needed?: No Pre-visit prep was completed: no AWV questionnaire completed by patient prior to visit?: no Living arrangements:: (!) lives alone Patient's Overall Health Status Rating: good Typical amount of pain: none Does pain affect daily life?: no Are you currently prescribed opioids?: no  Dietary Habits and Nutritional Risks How many meals a day?: 2 Eats fruit and vegetables  daily?: yes Most meals are obtained by: preparing own meals; eating out In the last 2 weeks, have you had any of the following?: none Diabetic:: no  Functional Status Activities of Daily Living (to include ambulation/medication): (Patient-Rptd) Independent Ambulation: (Patient-Rptd) Independent Medication Administration: Independent Home Management (perform basic housework or laundry): (Patient-Rptd) Independent Manage your own finances?: yes Primary transportation is: driving Concerns about vision?: no *vision screening is required for WTM* Concerns about hearing?: no  Fall Screening Falls in the past year?: (Patient-Rptd) 0 Number of falls in past year: (Patient-Rptd) 0 Was there an injury with Fall?: (Patient-Rptd) 0 Fall Risk Category Calculator: (Patient-Rptd) 0 Patient Fall Risk Level: (Patient-Rptd) Low Fall Risk  Fall Risk Patient at Risk for Falls Due to: No Fall Risks Fall risk Follow up: Falls evaluation completed; Education provided; Falls prevention discussed  Home and Transportation Safety: All rugs have non-skid backing?: yes All stairs or steps have railings?: (!) no Grab bars in the bathtub or shower?: yes Have non-skid surface in bathtub or shower?: yes Good home lighting?: yes Regular seat belt use?: yes Hospital stays in the last year:: no  Cognitive Assessment Difficulty concentrating, remembering, or making decisions? : no Will 6CIT or Mini Cog be Completed: yes What year is it?: 0 points What month is it?: 0 points Give patient an address phrase to remember (5 components): Its very sunny outside today in December About what time is it?: 0 points Count backwards from 20 to 1: 0 points Say the months of the year in reverse: 0 points Repeat the address phrase from earlier: 0 points 6 CIT Score: 0 points  Advance Directives (For Healthcare) Does Patient Have a Medical Advance Directive?: No Would patient like information on creating a medical  advance  directive?: No - Patient declined  Reviewed/Updated  Reviewed/Updated: Reviewed All (Medical, Surgical, Family, Medications, Allergies, Care Teams, Patient Goals); Surgical History; Family History; Medications; Allergies; Care Teams; Patient Goals; Medical History    Allergies (verified) Patient has no known allergies.   Current Medications (verified) Outpatient Encounter Medications as of 10/18/2024  Medication Sig   atorvastatin  (LIPITOR) 40 MG tablet Take 1 tablet (40 mg total) by mouth daily.   Multiple Vitamins-Minerals (CENTRUM SILVER 50+MEN) TABS Take 1 tablet by mouth daily with breakfast.   niacin  (NIASPAN ) 1000 MG CR tablet TAKE 1 TABLET BY MOUTH EVERY DAY WITH LOW FAT SNACK AND ASPIRIN 30 MINUTES PRIOR   verapamil  (VERELAN ) 240 MG 24 hr capsule Take 1 capsule (240 mg total) by mouth at bedtime.   vitamin C  (ASCORBIC ACID ) 500 MG tablet Take 500 mg by mouth daily.   XARELTO  20 MG TABS tablet TAKE 1 TABLET(20 MG) BY MOUTH DAILY   No facility-administered encounter medications on file as of 10/18/2024.    History: Past Medical History:  Diagnosis Date   Atrial fibrillation (HCC)    Coronary artery calcification    Dyslipidemia    Erectile dysfunction    Hyperlipidemia    Hypertension    Obesity    OSA (obstructive sleep apnea)    Permanent atrial fibrillation (HCC) 04/25/2015   Cardioversion successful after 3 attempts on 03/20/2015; pt back in A. Fib on 03/28/2015. Anticoagulation long-term.   Sleep apnea    Phreesia 09/25/2020   Tobacco user 06/22/2013   Pt quit smoking October 2014.    Past Surgical History:  Procedure Laterality Date   BIOPSY  08/26/2022   Procedure: BIOPSY;  Surgeon: Avram Lupita BRAVO, MD;  Location: [facility];  Service: Gastroenterology;;   CARDIOVERSION N/A 03/20/2015   Procedure: CARDIOVERSION;  Surgeon: Gordy Bergamo, MD;  Location: [facility];  Service: Cardiovascular;  Laterality: N/A;   CHOLECYSTECTOMY N/A 08/27/2022   Procedure:  LAPAROSCOPIC CHOLECYSTECTOMY;  Surgeon: Curvin Deward MOULD, MD;  Location: [facility];  Service: General;  Laterality: N/A;   COLONOSCOPY  2014   COLONOSCOPY  03/19/2021   ERCP N/A 08/26/2022   Procedure: ENDOSCOPIC RETROGRADE CHOLANGIOPANCREATOGRAPHY (ERCP);  Surgeon: Avram Lupita BRAVO, MD;  Location: [facility];  Service: Gastroenterology;  Laterality: N/A;   EYE SURGERY  New lenses/cateracs   SPHINCTEROTOMY  08/26/2022   Procedure: SPHINCTEROTOMY;  Surgeon: Avram Lupita BRAVO, MD;  Location: [facility];  Service: Gastroenterology;;   TONSILLECTOMY     age 66   VASECTOMY     Family History  Problem Relation Age of Onset   Cancer Mother        kind unknown   Varicose Veins Mother    Lung cancer Sister    Colon cancer Paternal Grandmother    Esophageal cancer Neg Hx    Inflammatory bowel disease Neg Hx    Liver disease Neg Hx    Colon polyps Neg Hx    Stomach cancer Neg Hx    Rectal cancer Neg Hx    Sleep apnea Neg Hx    Social History   Occupational History   Occupation: retired  Tobacco Use   Smoking status: Former    Current packs/day: 0.00    Average packs/day: 1 pack/day for 50.0 years (50.0 ttl pk-yrs)    Types: Cigarettes    Start date: 08/27/1963    Quit date: 08/26/2013    Years since quitting: 11.1   Smokeless tobacco: Never   Tobacco comments:    0 cigarettes  for 3 weeks  Vaping Use   Vaping status: Never Used  Substance and Sexual Activity   Alcohol use: Yes    Alcohol/week: 1.0 standard drink of alcohol    Comment: Very rarely   Drug use: Yes    Frequency: 2.0 times per week    Types: Marijuana    Comment: pot daily   Sexual activity: Not Currently    Birth control/protection: Abstinence, Other-see comments    Comment: Vasectomy   Tobacco Counseling Counseling given: Not Answered Tobacco comments: 0 cigarettes for 3 weeks  SDOH Screenings   Food Insecurity: No Food Insecurity (10/18/2024)  Housing: Low Risk  (10/18/2024)  Transportation Needs: No  Transportation Needs (10/18/2024)  Utilities: Not At Risk (10/18/2024)  Alcohol Screen: Low Risk  (10/05/2024)  Depression (PHQ2-9): Low Risk  (10/18/2024)  Financial Resource Strain: Low Risk  (10/05/2024)  Physical Activity: Insufficiently Active (10/18/2024)  Social Connections: Socially Isolated (10/18/2024)  Stress: No Stress Concern Present (10/18/2024)  Tobacco Use: Medium Risk (10/18/2024)  Health Literacy: Adequate Health Literacy (10/18/2024)   See flowsheets for full screening details  Depression Screen PHQ 2 & 9 Depression Scale- Over the past 2 weeks, how often have you been bothered by any of the following problems? Little interest or pleasure in doing things: 0 Feeling down, depressed, or hopeless (PHQ Adolescent also includes...irritable): 0 PHQ-2 Total Score: 0 Trouble falling or staying asleep, or sleeping too much: 0 Feeling tired or having little energy: 0 Poor appetite or overeating (PHQ Adolescent also includes...weight loss): 0 Feeling bad about yourself - or that you are a failure or have let yourself or your family down: 0 Trouble concentrating on things, such as reading the newspaper or watching television (PHQ Adolescent also includes...like school work): 0 Moving or speaking so slowly that other people could have noticed. Or the opposite - being so fidgety or restless that you have been moving around a lot more than usual: 0 Thoughts that you would be better off dead, or of hurting yourself in some way: 0 PHQ-9 Total Score: 0 If you checked off any problems, how difficult have these problems made it for you to do your work, take care of things at home, or get along with other people?: Not difficult at all     Goals Addressed             This Visit's Progress    Increase physical activity       Loose wieght             Objective:    Today's Vitals   10/18/24 0852  Weight: 275 lb (124.7 kg)  Height: 6' (1.829 m)   Body mass index is 37.3  kg/m.  Hearing/Vision screen Hearing Screening - Comments:: No trouble hearing Vision Screening - Comments:: Robinson Up to date Immunizations and Health Maintenance Health Maintenance  Topic Date Due   COVID-19 Vaccine (4 - 2025-26 season) 10/28/2024 (Originally 07/18/2024)   Influenza Vaccine  02/14/2025 (Originally 06/17/2024)   Lung Cancer Screening  04/15/2025   Medicare Annual Wellness (AWV)  10/18/2025   Colonoscopy  07/20/2026   Pneumococcal Vaccine: 50+ Years  Completed   Hepatitis C Screening  Completed   Zoster Vaccines- Shingrix   Completed   Meningococcal B Vaccine  Aged Out   DTaP/Tdap/Td  Discontinued        Assessment/Plan:  This is a routine wellness examination for Edward Watson.  Patient Care Team: Levora Reyes SAUNDERS, MD as PCP - General (Family Medicine) Robinson,  Selinda, OD as Referring Physician (Optometry) Ladona Heinz, MD as Consulting Physician (Cardiology) Buck Saucer, MD as Attending Physician (Neurology)  I have personally reviewed and noted the following in the patient's chart:   Medical and social history Use of alcohol, tobacco or illicit drugs  Current medications and supplements including opioid prescriptions. Functional ability and status Nutritional status Physical activity Advanced directives List of other physicians Hospitalizations, surgeries, and ER visits in previous 12 months Vitals Screenings to include cognitive, depression, and falls Referrals and appointments  No orders of the defined types were placed in this encounter.  In addition, I have reviewed and discussed with patient certain preventive protocols, quality metrics, and best practice recommendations. A written personalized care plan for preventive services as well as general preventive health recommendations were provided to patient.   Mliss Graff, LPN   [id number]   Return in 1 year (on 10/18/2025).  After Visit Summary: (MyChart) Due to this being a telephonic visit, the after  visit summary with patients personalized plan was offered to patient via MyChart   Nurse Notes:

## 2024-11-08 ENCOUNTER — Other Ambulatory Visit: Payer: Self-pay | Admitting: Family Medicine

## 2024-11-08 DIAGNOSIS — E785 Hyperlipidemia, unspecified: Secondary | ICD-10-CM

## 2024-11-16 ENCOUNTER — Other Ambulatory Visit: Payer: Self-pay | Admitting: Family Medicine

## 2024-11-16 DIAGNOSIS — E785 Hyperlipidemia, unspecified: Secondary | ICD-10-CM

## 2024-12-13 ENCOUNTER — Other Ambulatory Visit: Payer: Self-pay | Admitting: *Deleted

## 2024-12-13 DIAGNOSIS — Z87891 Personal history of nicotine dependence: Secondary | ICD-10-CM

## 2024-12-13 DIAGNOSIS — Z122 Encounter for screening for malignant neoplasm of respiratory organs: Secondary | ICD-10-CM

## 2025-04-12 ENCOUNTER — Ambulatory Visit: Admitting: Family Medicine
